# Patient Record
Sex: Female | Born: 1935 | Race: White | Hispanic: No | Smoking: Never smoker
Health system: Southern US, Community
[De-identification: ages and names within clinical notes are randomized; demographics above are authoritative.]

## PROBLEM LIST (undated history)

## (undated) DIAGNOSIS — M1990 Unspecified osteoarthritis, unspecified site: Secondary | ICD-10-CM

## (undated) DIAGNOSIS — D510 Vitamin B12 deficiency anemia due to intrinsic factor deficiency: Secondary | ICD-10-CM

## (undated) DIAGNOSIS — L853 Xerosis cutis: Secondary | ICD-10-CM

## (undated) DIAGNOSIS — K921 Melena: Secondary | ICD-10-CM

## (undated) DIAGNOSIS — C801 Malignant (primary) neoplasm, unspecified: Secondary | ICD-10-CM

## (undated) DIAGNOSIS — K219 Gastro-esophageal reflux disease without esophagitis: Secondary | ICD-10-CM

## (undated) DIAGNOSIS — K449 Diaphragmatic hernia without obstruction or gangrene: Secondary | ICD-10-CM

## (undated) DIAGNOSIS — R32 Unspecified urinary incontinence: Secondary | ICD-10-CM

## (undated) DIAGNOSIS — M4800 Spinal stenosis, site unspecified: Secondary | ICD-10-CM

## (undated) DIAGNOSIS — B019 Varicella without complication: Secondary | ICD-10-CM

## (undated) DIAGNOSIS — N159 Renal tubulo-interstitial disease, unspecified: Secondary | ICD-10-CM

## (undated) DIAGNOSIS — M810 Age-related osteoporosis without current pathological fracture: Secondary | ICD-10-CM

## (undated) DIAGNOSIS — C569 Malignant neoplasm of unspecified ovary: Secondary | ICD-10-CM

## (undated) DIAGNOSIS — I10 Essential (primary) hypertension: Secondary | ICD-10-CM

## (undated) DIAGNOSIS — IMO0002 Reserved for concepts with insufficient information to code with codable children: Secondary | ICD-10-CM

## (undated) HISTORY — DX: Melena: K92.1

## (undated) HISTORY — DX: Xerosis cutis: L85.3

## (undated) HISTORY — DX: Age-related osteoporosis without current pathological fracture: M81.0

## (undated) HISTORY — DX: Renal tubulo-interstitial disease, unspecified: N15.9

## (undated) HISTORY — DX: Reserved for concepts with insufficient information to code with codable children: IMO0002

## (undated) HISTORY — DX: Spinal stenosis, site unspecified: M48.00

## (undated) HISTORY — DX: Varicella without complication: B01.9

## (undated) HISTORY — DX: Malignant neoplasm of unspecified ovary: C56.9

## (undated) HISTORY — DX: Malignant (primary) neoplasm, unspecified: C80.1

## (undated) HISTORY — DX: Essential (primary) hypertension: I10

## (undated) HISTORY — PX: ABDOMINAL HYSTERECTOMY: SHX81

## (undated) HISTORY — DX: Diaphragmatic hernia without obstruction or gangrene: K44.9

## (undated) HISTORY — DX: Unspecified urinary incontinence: R32

## (undated) HISTORY — DX: Gastro-esophageal reflux disease without esophagitis: K21.9

## (undated) HISTORY — DX: Unspecified osteoarthritis, unspecified site: M19.90

## (undated) HISTORY — DX: Vitamin B12 deficiency anemia due to intrinsic factor deficiency: D51.0

---

## 1989-09-10 DIAGNOSIS — C569 Malignant neoplasm of unspecified ovary: Secondary | ICD-10-CM

## 1989-09-10 HISTORY — DX: Malignant neoplasm of unspecified ovary: C56.9

## 1998-09-10 HISTORY — PX: FOOT SURGERY: SHX648

## 2005-01-31 ENCOUNTER — Ambulatory Visit: Payer: Self-pay | Admitting: Internal Medicine

## 2005-02-14 ENCOUNTER — Ambulatory Visit: Payer: Self-pay | Admitting: Internal Medicine

## 2005-03-01 ENCOUNTER — Ambulatory Visit: Payer: Self-pay | Admitting: Unknown Physician Specialty

## 2005-03-10 ENCOUNTER — Ambulatory Visit: Payer: Self-pay | Admitting: Internal Medicine

## 2005-04-20 ENCOUNTER — Ambulatory Visit: Payer: Self-pay | Admitting: Unknown Physician Specialty

## 2005-04-25 ENCOUNTER — Ambulatory Visit: Payer: Self-pay | Admitting: Unknown Physician Specialty

## 2005-07-19 ENCOUNTER — Ambulatory Visit: Payer: Self-pay | Admitting: Internal Medicine

## 2005-10-23 ENCOUNTER — Ambulatory Visit: Payer: Self-pay | Admitting: Internal Medicine

## 2005-11-08 ENCOUNTER — Ambulatory Visit: Payer: Self-pay | Admitting: Internal Medicine

## 2005-12-09 ENCOUNTER — Ambulatory Visit: Payer: Self-pay | Admitting: Internal Medicine

## 2006-01-08 ENCOUNTER — Ambulatory Visit: Payer: Self-pay | Admitting: Internal Medicine

## 2006-07-18 ENCOUNTER — Ambulatory Visit: Payer: Self-pay | Admitting: Ophthalmology

## 2006-07-24 ENCOUNTER — Ambulatory Visit: Payer: Self-pay | Admitting: Ophthalmology

## 2006-09-04 ENCOUNTER — Ambulatory Visit: Payer: Self-pay | Admitting: Internal Medicine

## 2006-10-24 ENCOUNTER — Ambulatory Visit: Payer: Self-pay | Admitting: Nurse Practitioner

## 2006-10-31 ENCOUNTER — Ambulatory Visit: Payer: Self-pay | Admitting: Nurse Practitioner

## 2007-02-09 ENCOUNTER — Ambulatory Visit: Payer: Self-pay | Admitting: Internal Medicine

## 2007-09-17 ENCOUNTER — Ambulatory Visit: Payer: Self-pay | Admitting: Unknown Physician Specialty

## 2008-05-18 ENCOUNTER — Ambulatory Visit: Payer: Self-pay | Admitting: Unknown Physician Specialty

## 2008-06-28 ENCOUNTER — Ambulatory Visit: Payer: Self-pay | Admitting: Family Medicine

## 2008-07-12 ENCOUNTER — Ambulatory Visit: Payer: Self-pay | Admitting: Podiatry

## 2008-12-27 ENCOUNTER — Ambulatory Visit: Payer: Self-pay | Admitting: Unknown Physician Specialty

## 2009-03-03 ENCOUNTER — Ambulatory Visit: Payer: Self-pay | Admitting: Internal Medicine

## 2009-03-30 ENCOUNTER — Ambulatory Visit: Payer: Self-pay | Admitting: Internal Medicine

## 2009-08-09 ENCOUNTER — Ambulatory Visit: Payer: Self-pay | Admitting: Unknown Physician Specialty

## 2009-10-15 ENCOUNTER — Ambulatory Visit: Payer: Self-pay | Admitting: Unknown Physician Specialty

## 2009-12-29 ENCOUNTER — Ambulatory Visit: Payer: Self-pay | Admitting: Unknown Physician Specialty

## 2010-09-21 ENCOUNTER — Ambulatory Visit: Payer: Self-pay | Admitting: General Practice

## 2010-10-02 ENCOUNTER — Ambulatory Visit: Payer: Self-pay | Admitting: General Practice

## 2011-01-07 ENCOUNTER — Ambulatory Visit: Payer: Self-pay | Admitting: Family Medicine

## 2011-01-22 ENCOUNTER — Inpatient Hospital Stay: Payer: Self-pay | Admitting: Internal Medicine

## 2011-07-11 ENCOUNTER — Inpatient Hospital Stay: Payer: Self-pay | Admitting: Internal Medicine

## 2011-07-23 ENCOUNTER — Ambulatory Visit: Payer: Self-pay | Admitting: Internal Medicine

## 2011-07-31 ENCOUNTER — Ambulatory Visit: Payer: Self-pay | Admitting: Internal Medicine

## 2011-08-13 ENCOUNTER — Emergency Department: Payer: Self-pay | Admitting: Emergency Medicine

## 2011-09-11 HISTORY — PX: SHOULDER SURGERY: SHX246

## 2011-09-18 DIAGNOSIS — S42209A Unspecified fracture of upper end of unspecified humerus, initial encounter for closed fracture: Secondary | ICD-10-CM | POA: Diagnosis not present

## 2011-09-18 DIAGNOSIS — S8000XA Contusion of unspecified knee, initial encounter: Secondary | ICD-10-CM | POA: Diagnosis not present

## 2011-09-25 DIAGNOSIS — K2940 Chronic atrophic gastritis without bleeding: Secondary | ICD-10-CM | POA: Diagnosis not present

## 2011-09-25 DIAGNOSIS — D500 Iron deficiency anemia secondary to blood loss (chronic): Secondary | ICD-10-CM | POA: Diagnosis not present

## 2011-10-02 DIAGNOSIS — M25619 Stiffness of unspecified shoulder, not elsewhere classified: Secondary | ICD-10-CM | POA: Diagnosis not present

## 2011-10-02 DIAGNOSIS — M6281 Muscle weakness (generalized): Secondary | ICD-10-CM | POA: Diagnosis not present

## 2011-10-02 DIAGNOSIS — M25519 Pain in unspecified shoulder: Secondary | ICD-10-CM | POA: Diagnosis not present

## 2011-10-02 DIAGNOSIS — S42209A Unspecified fracture of upper end of unspecified humerus, initial encounter for closed fracture: Secondary | ICD-10-CM | POA: Diagnosis not present

## 2011-10-04 DIAGNOSIS — M25619 Stiffness of unspecified shoulder, not elsewhere classified: Secondary | ICD-10-CM | POA: Diagnosis not present

## 2011-10-04 DIAGNOSIS — M6281 Muscle weakness (generalized): Secondary | ICD-10-CM | POA: Diagnosis not present

## 2011-10-04 DIAGNOSIS — M25519 Pain in unspecified shoulder: Secondary | ICD-10-CM | POA: Diagnosis not present

## 2011-10-04 DIAGNOSIS — S42209A Unspecified fracture of upper end of unspecified humerus, initial encounter for closed fracture: Secondary | ICD-10-CM | POA: Diagnosis not present

## 2011-10-09 DIAGNOSIS — M25519 Pain in unspecified shoulder: Secondary | ICD-10-CM | POA: Diagnosis not present

## 2011-10-09 DIAGNOSIS — M6281 Muscle weakness (generalized): Secondary | ICD-10-CM | POA: Diagnosis not present

## 2011-10-09 DIAGNOSIS — M25619 Stiffness of unspecified shoulder, not elsewhere classified: Secondary | ICD-10-CM | POA: Diagnosis not present

## 2011-10-09 DIAGNOSIS — S42209A Unspecified fracture of upper end of unspecified humerus, initial encounter for closed fracture: Secondary | ICD-10-CM | POA: Diagnosis not present

## 2011-10-11 DIAGNOSIS — M25519 Pain in unspecified shoulder: Secondary | ICD-10-CM | POA: Diagnosis not present

## 2011-10-11 DIAGNOSIS — M6281 Muscle weakness (generalized): Secondary | ICD-10-CM | POA: Diagnosis not present

## 2011-10-11 DIAGNOSIS — S42209A Unspecified fracture of upper end of unspecified humerus, initial encounter for closed fracture: Secondary | ICD-10-CM | POA: Diagnosis not present

## 2011-10-11 DIAGNOSIS — M25619 Stiffness of unspecified shoulder, not elsewhere classified: Secondary | ICD-10-CM | POA: Diagnosis not present

## 2011-10-15 DIAGNOSIS — S42209A Unspecified fracture of upper end of unspecified humerus, initial encounter for closed fracture: Secondary | ICD-10-CM | POA: Diagnosis not present

## 2011-10-15 DIAGNOSIS — M25519 Pain in unspecified shoulder: Secondary | ICD-10-CM | POA: Diagnosis not present

## 2011-10-15 DIAGNOSIS — M6281 Muscle weakness (generalized): Secondary | ICD-10-CM | POA: Diagnosis not present

## 2011-10-15 DIAGNOSIS — M25619 Stiffness of unspecified shoulder, not elsewhere classified: Secondary | ICD-10-CM | POA: Diagnosis not present

## 2011-10-17 DIAGNOSIS — S42209A Unspecified fracture of upper end of unspecified humerus, initial encounter for closed fracture: Secondary | ICD-10-CM | POA: Diagnosis not present

## 2011-10-17 DIAGNOSIS — M25619 Stiffness of unspecified shoulder, not elsewhere classified: Secondary | ICD-10-CM | POA: Diagnosis not present

## 2011-10-17 DIAGNOSIS — M6281 Muscle weakness (generalized): Secondary | ICD-10-CM | POA: Diagnosis not present

## 2011-10-17 DIAGNOSIS — M25519 Pain in unspecified shoulder: Secondary | ICD-10-CM | POA: Diagnosis not present

## 2011-10-22 DIAGNOSIS — G4700 Insomnia, unspecified: Secondary | ICD-10-CM | POA: Diagnosis not present

## 2011-10-22 DIAGNOSIS — S42209A Unspecified fracture of upper end of unspecified humerus, initial encounter for closed fracture: Secondary | ICD-10-CM | POA: Diagnosis not present

## 2011-10-22 DIAGNOSIS — M25619 Stiffness of unspecified shoulder, not elsewhere classified: Secondary | ICD-10-CM | POA: Diagnosis not present

## 2011-10-22 DIAGNOSIS — M6281 Muscle weakness (generalized): Secondary | ICD-10-CM | POA: Diagnosis not present

## 2011-10-22 DIAGNOSIS — M25519 Pain in unspecified shoulder: Secondary | ICD-10-CM | POA: Diagnosis not present

## 2011-10-22 DIAGNOSIS — R350 Frequency of micturition: Secondary | ICD-10-CM | POA: Diagnosis not present

## 2011-10-23 DIAGNOSIS — D510 Vitamin B12 deficiency anemia due to intrinsic factor deficiency: Secondary | ICD-10-CM | POA: Diagnosis not present

## 2011-10-24 DIAGNOSIS — M25619 Stiffness of unspecified shoulder, not elsewhere classified: Secondary | ICD-10-CM | POA: Diagnosis not present

## 2011-10-24 DIAGNOSIS — M25519 Pain in unspecified shoulder: Secondary | ICD-10-CM | POA: Diagnosis not present

## 2011-10-24 DIAGNOSIS — M6281 Muscle weakness (generalized): Secondary | ICD-10-CM | POA: Diagnosis not present

## 2011-10-24 DIAGNOSIS — S42209A Unspecified fracture of upper end of unspecified humerus, initial encounter for closed fracture: Secondary | ICD-10-CM | POA: Diagnosis not present

## 2011-10-29 DIAGNOSIS — S42209A Unspecified fracture of upper end of unspecified humerus, initial encounter for closed fracture: Secondary | ICD-10-CM | POA: Diagnosis not present

## 2011-10-29 DIAGNOSIS — M6281 Muscle weakness (generalized): Secondary | ICD-10-CM | POA: Diagnosis not present

## 2011-10-29 DIAGNOSIS — M25519 Pain in unspecified shoulder: Secondary | ICD-10-CM | POA: Diagnosis not present

## 2011-10-29 DIAGNOSIS — M25619 Stiffness of unspecified shoulder, not elsewhere classified: Secondary | ICD-10-CM | POA: Diagnosis not present

## 2011-10-31 DIAGNOSIS — S42209A Unspecified fracture of upper end of unspecified humerus, initial encounter for closed fracture: Secondary | ICD-10-CM | POA: Diagnosis not present

## 2011-10-31 DIAGNOSIS — M25519 Pain in unspecified shoulder: Secondary | ICD-10-CM | POA: Diagnosis not present

## 2011-10-31 DIAGNOSIS — M25619 Stiffness of unspecified shoulder, not elsewhere classified: Secondary | ICD-10-CM | POA: Diagnosis not present

## 2011-10-31 DIAGNOSIS — M6281 Muscle weakness (generalized): Secondary | ICD-10-CM | POA: Diagnosis not present

## 2011-11-08 DIAGNOSIS — M25519 Pain in unspecified shoulder: Secondary | ICD-10-CM | POA: Diagnosis not present

## 2011-11-08 DIAGNOSIS — M25619 Stiffness of unspecified shoulder, not elsewhere classified: Secondary | ICD-10-CM | POA: Diagnosis not present

## 2011-11-08 DIAGNOSIS — M6281 Muscle weakness (generalized): Secondary | ICD-10-CM | POA: Diagnosis not present

## 2011-11-08 DIAGNOSIS — S42209A Unspecified fracture of upper end of unspecified humerus, initial encounter for closed fracture: Secondary | ICD-10-CM | POA: Diagnosis not present

## 2011-11-14 DIAGNOSIS — M25619 Stiffness of unspecified shoulder, not elsewhere classified: Secondary | ICD-10-CM | POA: Diagnosis not present

## 2011-11-14 DIAGNOSIS — M6281 Muscle weakness (generalized): Secondary | ICD-10-CM | POA: Diagnosis not present

## 2011-11-14 DIAGNOSIS — M25519 Pain in unspecified shoulder: Secondary | ICD-10-CM | POA: Diagnosis not present

## 2011-11-14 DIAGNOSIS — S42209A Unspecified fracture of upper end of unspecified humerus, initial encounter for closed fracture: Secondary | ICD-10-CM | POA: Diagnosis not present

## 2011-11-21 DIAGNOSIS — M6281 Muscle weakness (generalized): Secondary | ICD-10-CM | POA: Diagnosis not present

## 2011-11-21 DIAGNOSIS — M25619 Stiffness of unspecified shoulder, not elsewhere classified: Secondary | ICD-10-CM | POA: Diagnosis not present

## 2011-11-21 DIAGNOSIS — S42209A Unspecified fracture of upper end of unspecified humerus, initial encounter for closed fracture: Secondary | ICD-10-CM | POA: Diagnosis not present

## 2011-12-04 DIAGNOSIS — D510 Vitamin B12 deficiency anemia due to intrinsic factor deficiency: Secondary | ICD-10-CM | POA: Diagnosis not present

## 2011-12-04 DIAGNOSIS — D649 Anemia, unspecified: Secondary | ICD-10-CM | POA: Diagnosis not present

## 2011-12-12 DIAGNOSIS — L819 Disorder of pigmentation, unspecified: Secondary | ICD-10-CM | POA: Diagnosis not present

## 2011-12-12 DIAGNOSIS — L570 Actinic keratosis: Secondary | ICD-10-CM | POA: Diagnosis not present

## 2011-12-12 DIAGNOSIS — Q809 Congenital ichthyosis, unspecified: Secondary | ICD-10-CM | POA: Diagnosis not present

## 2011-12-12 DIAGNOSIS — L821 Other seborrheic keratosis: Secondary | ICD-10-CM | POA: Diagnosis not present

## 2011-12-13 DIAGNOSIS — Z124 Encounter for screening for malignant neoplasm of cervix: Secondary | ICD-10-CM | POA: Diagnosis not present

## 2011-12-13 DIAGNOSIS — Z23 Encounter for immunization: Secondary | ICD-10-CM | POA: Diagnosis not present

## 2011-12-13 DIAGNOSIS — E7800 Pure hypercholesterolemia, unspecified: Secondary | ICD-10-CM | POA: Diagnosis not present

## 2011-12-13 DIAGNOSIS — E119 Type 2 diabetes mellitus without complications: Secondary | ICD-10-CM | POA: Diagnosis not present

## 2011-12-13 DIAGNOSIS — R350 Frequency of micturition: Secondary | ICD-10-CM | POA: Diagnosis not present

## 2011-12-13 DIAGNOSIS — R5381 Other malaise: Secondary | ICD-10-CM | POA: Diagnosis not present

## 2011-12-19 DIAGNOSIS — M79609 Pain in unspecified limb: Secondary | ICD-10-CM | POA: Diagnosis not present

## 2011-12-19 DIAGNOSIS — B351 Tinea unguium: Secondary | ICD-10-CM | POA: Diagnosis not present

## 2012-01-07 DIAGNOSIS — R351 Nocturia: Secondary | ICD-10-CM | POA: Diagnosis not present

## 2012-01-07 DIAGNOSIS — N3941 Urge incontinence: Secondary | ICD-10-CM | POA: Diagnosis not present

## 2012-01-07 DIAGNOSIS — R350 Frequency of micturition: Secondary | ICD-10-CM | POA: Diagnosis not present

## 2012-01-15 ENCOUNTER — Ambulatory Visit: Payer: Self-pay | Admitting: Unknown Physician Specialty

## 2012-01-15 DIAGNOSIS — Z1231 Encounter for screening mammogram for malignant neoplasm of breast: Secondary | ICD-10-CM | POA: Diagnosis not present

## 2012-01-17 DIAGNOSIS — D510 Vitamin B12 deficiency anemia due to intrinsic factor deficiency: Secondary | ICD-10-CM | POA: Diagnosis not present

## 2012-01-17 DIAGNOSIS — E119 Type 2 diabetes mellitus without complications: Secondary | ICD-10-CM | POA: Diagnosis not present

## 2012-01-22 DIAGNOSIS — K2940 Chronic atrophic gastritis without bleeding: Secondary | ICD-10-CM | POA: Diagnosis not present

## 2012-01-22 DIAGNOSIS — D500 Iron deficiency anemia secondary to blood loss (chronic): Secondary | ICD-10-CM | POA: Diagnosis not present

## 2012-01-31 DIAGNOSIS — N3941 Urge incontinence: Secondary | ICD-10-CM | POA: Diagnosis not present

## 2012-02-26 DIAGNOSIS — D510 Vitamin B12 deficiency anemia due to intrinsic factor deficiency: Secondary | ICD-10-CM | POA: Diagnosis not present

## 2012-03-26 DIAGNOSIS — H02839 Dermatochalasis of unspecified eye, unspecified eyelid: Secondary | ICD-10-CM | POA: Diagnosis not present

## 2012-04-07 DIAGNOSIS — G609 Hereditary and idiopathic neuropathy, unspecified: Secondary | ICD-10-CM | POA: Diagnosis not present

## 2012-04-07 DIAGNOSIS — D510 Vitamin B12 deficiency anemia due to intrinsic factor deficiency: Secondary | ICD-10-CM | POA: Diagnosis not present

## 2012-04-08 DIAGNOSIS — D510 Vitamin B12 deficiency anemia due to intrinsic factor deficiency: Secondary | ICD-10-CM | POA: Diagnosis not present

## 2012-04-08 DIAGNOSIS — G609 Hereditary and idiopathic neuropathy, unspecified: Secondary | ICD-10-CM | POA: Diagnosis not present

## 2012-04-21 DIAGNOSIS — G450 Vertebro-basilar artery syndrome: Secondary | ICD-10-CM | POA: Diagnosis not present

## 2012-04-21 DIAGNOSIS — G589 Mononeuropathy, unspecified: Secondary | ICD-10-CM | POA: Diagnosis not present

## 2012-04-21 DIAGNOSIS — I951 Orthostatic hypotension: Secondary | ICD-10-CM | POA: Diagnosis not present

## 2012-04-28 DIAGNOSIS — D649 Anemia, unspecified: Secondary | ICD-10-CM | POA: Diagnosis not present

## 2012-04-28 DIAGNOSIS — D510 Vitamin B12 deficiency anemia due to intrinsic factor deficiency: Secondary | ICD-10-CM | POA: Diagnosis not present

## 2012-05-19 DIAGNOSIS — D510 Vitamin B12 deficiency anemia due to intrinsic factor deficiency: Secondary | ICD-10-CM | POA: Diagnosis not present

## 2012-05-22 ENCOUNTER — Ambulatory Visit: Payer: Self-pay

## 2012-05-22 DIAGNOSIS — R42 Dizziness and giddiness: Secondary | ICD-10-CM | POA: Diagnosis not present

## 2012-05-22 DIAGNOSIS — Z79899 Other long term (current) drug therapy: Secondary | ICD-10-CM | POA: Diagnosis not present

## 2012-05-22 DIAGNOSIS — R209 Unspecified disturbances of skin sensation: Secondary | ICD-10-CM | POA: Diagnosis not present

## 2012-05-22 DIAGNOSIS — E1149 Type 2 diabetes mellitus with other diabetic neurological complication: Secondary | ICD-10-CM | POA: Diagnosis not present

## 2012-05-22 DIAGNOSIS — E1142 Type 2 diabetes mellitus with diabetic polyneuropathy: Secondary | ICD-10-CM | POA: Diagnosis not present

## 2012-05-30 ENCOUNTER — Encounter: Payer: Self-pay | Admitting: Internal Medicine

## 2012-05-30 ENCOUNTER — Ambulatory Visit (INDEPENDENT_AMBULATORY_CARE_PROVIDER_SITE_OTHER): Payer: Medicare Other | Admitting: Internal Medicine

## 2012-05-30 VITALS — BP 118/72 | HR 62 | Temp 99.1°F | Ht 62.5 in | Wt 207.2 lb

## 2012-05-30 DIAGNOSIS — E119 Type 2 diabetes mellitus without complications: Secondary | ICD-10-CM | POA: Diagnosis not present

## 2012-05-30 DIAGNOSIS — G589 Mononeuropathy, unspecified: Secondary | ICD-10-CM | POA: Diagnosis not present

## 2012-05-30 DIAGNOSIS — E039 Hypothyroidism, unspecified: Secondary | ICD-10-CM | POA: Diagnosis not present

## 2012-05-30 DIAGNOSIS — E118 Type 2 diabetes mellitus with unspecified complications: Secondary | ICD-10-CM | POA: Diagnosis not present

## 2012-05-30 DIAGNOSIS — M545 Low back pain, unspecified: Secondary | ICD-10-CM

## 2012-05-30 DIAGNOSIS — F3289 Other specified depressive episodes: Secondary | ICD-10-CM

## 2012-05-30 DIAGNOSIS — R29898 Other symptoms and signs involving the musculoskeletal system: Secondary | ICD-10-CM

## 2012-05-30 DIAGNOSIS — R42 Dizziness and giddiness: Secondary | ICD-10-CM

## 2012-05-30 DIAGNOSIS — G4700 Insomnia, unspecified: Secondary | ICD-10-CM

## 2012-05-30 DIAGNOSIS — E1165 Type 2 diabetes mellitus with hyperglycemia: Secondary | ICD-10-CM | POA: Insufficient documentation

## 2012-05-30 DIAGNOSIS — D510 Vitamin B12 deficiency anemia due to intrinsic factor deficiency: Secondary | ICD-10-CM | POA: Diagnosis not present

## 2012-05-30 DIAGNOSIS — G629 Polyneuropathy, unspecified: Secondary | ICD-10-CM

## 2012-05-30 DIAGNOSIS — R209 Unspecified disturbances of skin sensation: Secondary | ICD-10-CM | POA: Diagnosis not present

## 2012-05-30 DIAGNOSIS — R202 Paresthesia of skin: Secondary | ICD-10-CM

## 2012-05-30 DIAGNOSIS — F32A Depression, unspecified: Secondary | ICD-10-CM | POA: Insufficient documentation

## 2012-05-30 DIAGNOSIS — Z8543 Personal history of malignant neoplasm of ovary: Secondary | ICD-10-CM

## 2012-05-30 DIAGNOSIS — F329 Major depressive disorder, single episode, unspecified: Secondary | ICD-10-CM | POA: Insufficient documentation

## 2012-05-30 LAB — COMPREHENSIVE METABOLIC PANEL
ALT: 15 U/L (ref 0–35)
Albumin: 4.2 g/dL (ref 3.5–5.2)
CO2: 28 mEq/L (ref 19–32)
Chloride: 102 mEq/L (ref 96–112)
GFR: 65.88 mL/min (ref >60.00)
Glucose, Bld: 94 mg/dL (ref 70–99)
Potassium: 3.9 mEq/L (ref 3.5–5.1)
Sodium: 138 mEq/L (ref 135–145)
Total Bilirubin: 0.8 mg/dL (ref 0.3–1.2)
Total Protein: 7.5 g/dL (ref 6.0–8.3)

## 2012-05-30 LAB — CBC WITH DIFFERENTIAL/PLATELET
Basophils Absolute: 0 10*3/uL (ref 0.0–0.1)
Eosinophils Absolute: 0.1 10*3/uL (ref 0.0–0.7)
Hemoglobin: 13.1 g/dL (ref 12.0–15.0)
Lymphocytes Relative: 22.2 % (ref 12.0–46.0)
Lymphs Abs: 1.5 10*3/uL (ref 0.7–4.0)
MCHC: 33.1 g/dL (ref 30.0–36.0)
Neutro Abs: 4.8 10*3/uL (ref 1.4–7.7)
Platelets: 285 10*3/uL (ref 150.0–400.0)
RDW: 12.8 % (ref 11.5–14.6)

## 2012-05-30 LAB — HEMOGLOBIN A1C: Hgb A1c MFr Bld: 7.4 % — ABNORMAL HIGH (ref 4.6–6.5)

## 2012-05-30 LAB — VITAMIN B12: Vitamin B-12: 738 pg/mL (ref 211–911)

## 2012-05-30 MED ORDER — MIRTAZAPINE 15 MG PO TABS: 15.0000 mg | ORAL_TABLET | Freq: Every day | ORAL | Status: DC

## 2012-05-30 MED ORDER — GABAPENTIN 100 MG PO CAPS: 100.0000 mg | ORAL_CAPSULE | Freq: Every day | ORAL | Status: DC

## 2012-05-30 NOTE — Assessment & Plan Note (Signed)
Patient reports episode of dizziness described as lightheadedness. Suspect that medications including Neurontin may be contributing to this. However, will also check labs including CBC, CMP, B12, TSH. Will check carotid Dopplers. Followup one month.

## 2012-05-30 NOTE — Assessment & Plan Note (Signed)
Question if this may be secondary to spinal stenosis, likely worsened by ongoing diabetic neuropathy. Will obtain report on previous MRI lumbar spine.Discussed potential referral to neurology for EMG testing, but will hold off for now until previous records reviewed.

## 2012-05-30 NOTE — Assessment & Plan Note (Signed)
Likely secondary to spinal stenosis. Will obtain records on previous MRI lumbar spine. Follow up 1 month.

## 2012-05-30 NOTE — Progress Notes (Signed)
Subjective:    Patient ID: Brenda [name], female    DOB: [birth year], 78 y.o.   MRN: [id number]  HPI 78 year old female with history of hypertension, diabetes presents to establish care. She has several concerns today. First, she reports several month history of numbness in her legs. She reports that her previous primary care physician was treating her with Neurontin 300 mg at bedtime. She denies any improvement with this medication. She notes that she has been sedated on this medication and often feels lightheaded. She denies any vertigo-like symptoms. She denies any syncope. She has had weakness for several months in her bilateral lower extremities. This has not previously been evaluated.  She is also concerned about difficulty sleeping and increased depressed mood. She reports that since her son passed away she has had increased depressed mood and difficulty sleeping. She notes that several of her close family members have died of malignancy. She has never taken medications for depression.  In regards to her diabetes, she reports blood sugars have been variable, with some highs above 200. She reports full compliance with glipizide and metformin. She denies any side effects from these medications.  She also notes a history of urinary incontinence. She reports that her urologist recently started her on Enablex. However, she cannot afford this medication. She reports minimal improvement in her symptoms on this medicine.  She also notes a history of chronic low back pain. She was told in the past that she had spinal stenosis. She reports that chronic low back pain limits her ability to function. She also notes chronic weakness in her bilateral lower extremities. She denies loss of continence of bowel. She does have chronic urinary incontinence as above.  Outpatient Encounter Prescriptions as of 05/30/2012  Medication Sig Dispense Refill  . darifenacin (ENABLEX) 15 MG 24 hr tablet Take 15 mg by mouth  daily.      . ferrous sulfate 324 (65 FE) MG TBEC Take 1 tablet by mouth 3 (three) times daily.      Marland Kitchen gabapentin (NEURONTIN) 100 MG capsule Take 1 capsule (100 mg total) by mouth at bedtime.  30 capsule  3  . glipiZIDE (GLUCOTROL XL) 10 MG 24 hr tablet Take 10 mg by mouth 2 (two) times daily.      Marland Kitchen lisinopril (PRINIVIL,ZESTRIL) 10 MG tablet Take 10 mg by mouth daily.      . metFORMIN (GLUCOPHAGE) 1000 MG tablet Take 1,000 mg by mouth 2 (two) times daily with a meal. Take one      . mirtazapine (REMERON) 15 MG tablet Take 1 tablet (15 mg total) by mouth at bedtime.  30 tablet  3  . pantoprazole (PROTONIX) 40 MG tablet Take 40 mg by mouth 2 (two) times daily.      . sucralfate (CARAFATE) 1 G tablet Take 1 g by mouth 2 (two) times daily.       BP 118/72  Pulse 62  Temp 99.1 F (37.3 C) (Oral)  Ht 5' 2.5" (1.588 m)  Wt 207 lb 4 oz (94.008 kg)  BMI 37.30 kg/m2  SpO2 96%  Review of Systems  Constitutional: Positive for fatigue. Negative for fever, chills, appetite change and unexpected weight change.  HENT: Negative for ear pain, congestion, sore throat, trouble swallowing, neck pain, voice change and sinus pressure.   Eyes: Negative for visual disturbance.  Respiratory: Negative for cough, shortness of breath, wheezing and stridor.   Cardiovascular: Negative for chest pain, palpitations and leg swelling.  Gastrointestinal: Negative for  nausea, vomiting, abdominal pain, diarrhea, constipation, blood in stool, abdominal distention and anal bleeding.  Genitourinary: Negative for dysuria and flank pain.  Musculoskeletal: Positive for myalgias and back pain. Negative for arthralgias and gait problem.  Skin: Negative for color change and rash.  Neurological: Positive for dizziness, weakness, light-headedness and numbness. Negative for syncope and headaches.  Hematological: Negative for adenopathy. Does not bruise/bleed easily.  Psychiatric/Behavioral: Positive for disturbed wake/sleep cycle and  dysphoric mood. Negative for suicidal ideas. The patient is not nervous/anxious.        Objective:   Physical Exam  Constitutional: She is oriented to person, place, and time. She appears well-developed and well-nourished. No distress.  HENT:  Head: Normocephalic and atraumatic.  Right Ear: External ear normal.  Left Ear: External ear normal.  Nose: Nose normal.  Mouth/Throat: Oropharynx is clear and moist. No oropharyngeal exudate.  Eyes: Conjunctivae normal are normal. Pupils are equal, round, and reactive to light. Right eye exhibits no discharge. Left eye exhibits no discharge. No scleral icterus.  Neck: Normal range of motion. Neck supple. Normal carotid pulses, no hepatojugular reflux and no JVD present. Carotid bruit is not present. No tracheal deviation present. No thyromegaly present.  Cardiovascular: Normal rate, regular rhythm, normal heart sounds and intact distal pulses.  Exam reveals no gallop and no friction rub.   No murmur heard. Pulmonary/Chest: Effort normal and breath sounds normal. No respiratory distress. She has no wheezes. She has no rales. She exhibits no tenderness.  Abdominal: Soft. Bowel sounds are normal. She exhibits no distension and no mass. There is no tenderness. There is no guarding.  Musculoskeletal: Normal range of motion. She exhibits no edema and no tenderness.       Right hip: She exhibits decreased strength.       Left hip: She exhibits decreased strength.       4/5 strength bilateral hip flexors  Lymphadenopathy:    She has no cervical adenopathy.  Neurological: She is alert and oriented to person, place, and time. No cranial nerve deficit. She exhibits normal muscle tone. Coordination normal.  Skin: Skin is warm and dry. No rash noted. She is not diaphoretic. No erythema. No pallor.  Psychiatric: She has a normal mood and affect. Her behavior is normal. Judgment and thought content normal.          Assessment & Plan:

## 2012-05-30 NOTE — Assessment & Plan Note (Signed)
Paresthesias of bilateral lower extremities most consistent with diabetic neuropathy. However, patient having some oversedation with Neurontin. Will reduce dose of Neurontin to 100 mg at bedtime. Will send basic lab work including CBC, B12, TSH, and electrolytes. If symptoms are persistent, we'll plan to gradually titrate up dose of Neurontin and possibly set up neurology referral for EMG testing.

## 2012-05-30 NOTE — Assessment & Plan Note (Signed)
Patient notes some depression, particularly after the death of her son. Likely contributing to insomnia. Will start Remeron to help with symptoms. Followup one month.

## 2012-05-30 NOTE — Assessment & Plan Note (Signed)
Suspect insomnia is contributing to fatigue. Will start Remeron to see if any improvement with depression and insomnia. Followup one month.

## 2012-05-30 NOTE — Assessment & Plan Note (Signed)
Patient reports history of B12 deficiency. B12 level normal today. However, macrocytosis noted on labs. Will check homocystine and methylmalonic acid.

## 2012-05-30 NOTE — Assessment & Plan Note (Signed)
Patient reports some variability in blood sugars. A1c just above 7.4%. We'll continue glipizide and metformin for now. Encouraged patient to record blood sugar readings and bring to next visit.

## 2012-06-18 ENCOUNTER — Encounter: Payer: Self-pay | Admitting: Internal Medicine

## 2012-06-18 ENCOUNTER — Ambulatory Visit (INDEPENDENT_AMBULATORY_CARE_PROVIDER_SITE_OTHER): Payer: Medicare Other | Admitting: Internal Medicine

## 2012-06-18 VITALS — BP 134/72 | HR 63 | Temp 98.5°F | Ht 63.5 in | Wt 210.0 lb

## 2012-06-18 DIAGNOSIS — Z23 Encounter for immunization: Secondary | ICD-10-CM | POA: Diagnosis not present

## 2012-06-18 DIAGNOSIS — R071 Chest pain on breathing: Secondary | ICD-10-CM | POA: Diagnosis not present

## 2012-06-18 DIAGNOSIS — D510 Vitamin B12 deficiency anemia due to intrinsic factor deficiency: Secondary | ICD-10-CM

## 2012-06-18 DIAGNOSIS — Z13820 Encounter for screening for osteoporosis: Secondary | ICD-10-CM | POA: Diagnosis not present

## 2012-06-18 DIAGNOSIS — E538 Deficiency of other specified B group vitamins: Secondary | ICD-10-CM

## 2012-06-18 DIAGNOSIS — M545 Low back pain: Secondary | ICD-10-CM

## 2012-06-18 DIAGNOSIS — E118 Type 2 diabetes mellitus with unspecified complications: Secondary | ICD-10-CM

## 2012-06-18 DIAGNOSIS — F329 Major depressive disorder, single episode, unspecified: Secondary | ICD-10-CM

## 2012-06-18 DIAGNOSIS — R0789 Other chest pain: Secondary | ICD-10-CM | POA: Insufficient documentation

## 2012-06-18 DIAGNOSIS — R202 Paresthesia of skin: Secondary | ICD-10-CM

## 2012-06-18 DIAGNOSIS — R209 Unspecified disturbances of skin sensation: Secondary | ICD-10-CM

## 2012-06-18 MED ORDER — CYANOCOBALAMIN 1000 MCG/ML IJ SOLN
1000.0000 ug | Freq: Once | INTRAMUSCULAR | Status: AC
  Administered 2012-06-18: 1000 ug via INTRAMUSCULAR

## 2012-06-18 NOTE — Progress Notes (Signed)
Subjective:    Patient ID: Brenda [name], female    DOB: 1930/11/04, 78 y.o.   MRN: [id number]  HPI 78 year old female with history diabetes, depression, spinal stenosis, pernicious anemia, and neuropathy presents for followup. In the interim since her last visit, she reports that symptoms of depression and insomnia have improved significantly with use of Remeron. She is now sleeping through the night without difficulty. Her mood is improved. In regards to neuropathy, we tried decreasing her dose of Neurontin to help with oversedation. She reports that symptoms of tingling and paresthesias in her legs have improved and she is no longer having sedation at a lower dose.  She is concerned because she continues to have pain in her lower back and sensation of weakness in her legs on occasion. MRI of the lumbar spine was performed in the past but results are not yet available at the time of her visit. She is not currently taking any medication for back pain and does not wish to start any new medicines.  She is also concerned today about some intermittent right-sided chest wall pain. This is described as aching pain extending from her right mid axilla down the right side of her chest and abdomen. It is not associated with shortness of breath, diaphoresis, palpitations, fever. It is not associated with any nausea or vomiting. She has no symptoms of abdominal pain. This is been ongoing for several weeks.  Outpatient Encounter Prescriptions as of 06/18/2012  Medication Sig Dispense Refill  . darifenacin (ENABLEX) 15 MG 24 hr tablet Take 15 mg by mouth daily.      . ferrous sulfate 324 (65 FE) MG TBEC Take 1 tablet by mouth 3 (three) times daily.      Marland Kitchen gabapentin (NEURONTIN) 100 MG capsule Take 1 capsule (100 mg total) by mouth at bedtime.  30 capsule  3  . glipiZIDE (GLUCOTROL XL) 10 MG 24 hr tablet Take 10 mg by mouth 2 (two) times daily.      Marland Kitchen lisinopril (PRINIVIL,ZESTRIL) 10 MG tablet Take 10 mg by mouth  daily.      . metFORMIN (GLUCOPHAGE) 1000 MG tablet Take 1,000 mg by mouth 2 (two) times daily with a meal. Take one      . mirtazapine (REMERON) 15 MG tablet Take 1 tablet (15 mg total) by mouth at bedtime.  30 tablet  3  . pantoprazole (PROTONIX) 40 MG tablet Take 40 mg by mouth 2 (two) times daily.      . sucralfate (CARAFATE) 1 G tablet Take 1 g by mouth 2 (two) times daily.       Facility-Administered Encounter Medications as of 06/18/2012  Medication Dose Route Frequency Provider Last Rate Last Dose  . cyanocobalamin ((VITAMIN B-12)) injection 1,000 mcg  1,000 mcg Intramuscular Once Shelia Media, MD   1,000 mcg at 06/18/12 1441   BP 134/72  Pulse 63  Temp 98.5 F (36.9 C) (Oral)  Ht 5' 3.5" (1.613 m)  Wt 210 lb (95.255 kg)  BMI 36.62 kg/m2  SpO2 98%  Review of Systems  Constitutional: Negative for fever, chills, appetite change, fatigue and unexpected weight change.  HENT: Negative for ear pain, congestion, sore throat, trouble swallowing, neck pain, voice change and sinus pressure.   Eyes: Negative for visual disturbance.  Respiratory: Negative for cough, shortness of breath, wheezing and stridor.   Cardiovascular: Positive for chest pain (right sided). Negative for palpitations and leg swelling.  Gastrointestinal: Negative for nausea, vomiting, abdominal pain, diarrhea, constipation,  blood in stool, abdominal distention and anal bleeding.  Genitourinary: Negative for dysuria and flank pain.  Musculoskeletal: Negative for myalgias, arthralgias and gait problem.  Skin: Negative for color change and rash.  Neurological: Negative for dizziness and headaches.  Hematological: Negative for adenopathy. Does not bruise/bleed easily.  Psychiatric/Behavioral: Negative for suicidal ideas, disturbed wake/sleep cycle and dysphoric mood. The patient is not nervous/anxious.        Objective:   Physical Exam  Constitutional: She is oriented to person, place, and time. She appears  well-developed and well-nourished. No distress.  HENT:  Head: Normocephalic and atraumatic.  Right Ear: External ear normal.  Left Ear: External ear normal.  Nose: Nose normal.  Mouth/Throat: Oropharynx is clear and moist. No oropharyngeal exudate.  Eyes: Conjunctivae normal are normal. Pupils are equal, round, and reactive to light. Right eye exhibits no discharge. Left eye exhibits no discharge. No scleral icterus.  Neck: Normal range of motion. Neck supple. No tracheal deviation present. No thyromegaly present.  Cardiovascular: Normal rate, regular rhythm, normal heart sounds and intact distal pulses.  Exam reveals no gallop and no friction rub.   No murmur heard. Pulmonary/Chest: Effort normal. No accessory muscle usage. Not tachypneic. No respiratory distress. She has no decreased breath sounds. She has no wheezes. She has rhonchi (few right lower lobe) in the right lower field. She has no rales. She exhibits no tenderness.  Musculoskeletal: Normal range of motion. She exhibits no edema and no tenderness.  Lymphadenopathy:    She has no cervical adenopathy.  Neurological: She is alert and oriented to person, place, and time. No cranial nerve deficit. She exhibits normal muscle tone. Coordination normal.  Skin: Skin is warm and dry. No rash noted. She is not diaphoretic. No erythema. No pallor.  Psychiatric: She has a normal mood and affect. Her behavior is normal. Judgment and thought content normal.          Assessment & Plan:

## 2012-06-18 NOTE — Assessment & Plan Note (Signed)
Recent A1c was fairly well-controlled at 7.4%. We'll continue current medications. Followup in one month.

## 2012-06-18 NOTE — Assessment & Plan Note (Signed)
Symptoms improved with use of Remeron. Will plan to continue. Followup one month.

## 2012-06-18 NOTE — Assessment & Plan Note (Signed)
Patient with history of pernicious anemia. Recent hemoglobin was normal. However persistent macrocytosis. Will plan to repeat CBC and B12 level prior to next visit. Continue B12 shots monthly.

## 2012-06-18 NOTE — Assessment & Plan Note (Signed)
Unclear etiology of right sided chest wall pain. Few crackles noted in RLL on lung exam. No other symptoms such as fever, cough to suggest pneumonia, but will get CXR for evaluation. Follow up 1 month and sooner as needed.

## 2012-06-18 NOTE — Assessment & Plan Note (Signed)
Symptomatically improved with decreased dose of neurontin.  Labs remarkable for macrocytosis c/w known B12 deficiency, otherwise normal. Will continue to monitor for now. If symptoms recur, then would favor neurology evaluation with EMG testing.

## 2012-06-18 NOTE — Assessment & Plan Note (Signed)
Still awaiting results on MRI of the lumbar spine. Will request again. Symptoms likely secondary to spinal stenosis. Followup one month.

## 2012-06-24 ENCOUNTER — Ambulatory Visit: Payer: Self-pay | Admitting: Internal Medicine

## 2012-06-24 DIAGNOSIS — R071 Chest pain on breathing: Secondary | ICD-10-CM | POA: Diagnosis not present

## 2012-06-24 DIAGNOSIS — M810 Age-related osteoporosis without current pathological fracture: Secondary | ICD-10-CM | POA: Diagnosis not present

## 2012-06-24 DIAGNOSIS — M899 Disorder of bone, unspecified: Secondary | ICD-10-CM | POA: Diagnosis not present

## 2012-06-24 DIAGNOSIS — Z13820 Encounter for screening for osteoporosis: Secondary | ICD-10-CM | POA: Diagnosis not present

## 2012-06-24 DIAGNOSIS — R0789 Other chest pain: Secondary | ICD-10-CM | POA: Diagnosis not present

## 2012-06-25 ENCOUNTER — Telehealth: Payer: Self-pay | Admitting: Internal Medicine

## 2012-06-25 NOTE — Telephone Encounter (Signed)
CXR was normal.

## 2012-06-25 NOTE — Telephone Encounter (Signed)
Bone density test was abnormal and showed weakening of the bones. T-score -2.4. I would recommend setting up follow up visit to discuss treatment options.

## 2012-06-26 DIAGNOSIS — N390 Urinary tract infection, site not specified: Secondary | ICD-10-CM | POA: Diagnosis not present

## 2012-06-26 DIAGNOSIS — R350 Frequency of micturition: Secondary | ICD-10-CM | POA: Diagnosis not present

## 2012-06-26 DIAGNOSIS — N3941 Urge incontinence: Secondary | ICD-10-CM | POA: Diagnosis not present

## 2012-06-26 DIAGNOSIS — R351 Nocturia: Secondary | ICD-10-CM | POA: Insufficient documentation

## 2012-06-26 NOTE — Telephone Encounter (Signed)
Left message on machine at home for patient to return call. 

## 2012-06-27 NOTE — Telephone Encounter (Signed)
Patient advised via telephone, she will call back to schedule bone density appt.

## 2012-06-27 NOTE — Telephone Encounter (Signed)
Patient advised via telephone

## 2012-07-16 ENCOUNTER — Other Ambulatory Visit: Payer: Medicare Other

## 2012-07-16 ENCOUNTER — Other Ambulatory Visit: Payer: Self-pay | Admitting: *Deleted

## 2012-07-16 DIAGNOSIS — D510 Vitamin B12 deficiency anemia due to intrinsic factor deficiency: Secondary | ICD-10-CM

## 2012-07-16 DIAGNOSIS — F329 Major depressive disorder, single episode, unspecified: Secondary | ICD-10-CM

## 2012-07-16 DIAGNOSIS — E538 Deficiency of other specified B group vitamins: Secondary | ICD-10-CM

## 2012-07-16 DIAGNOSIS — D649 Anemia, unspecified: Secondary | ICD-10-CM

## 2012-07-17 ENCOUNTER — Other Ambulatory Visit (INDEPENDENT_AMBULATORY_CARE_PROVIDER_SITE_OTHER): Payer: Medicare Other

## 2012-07-17 DIAGNOSIS — D649 Anemia, unspecified: Secondary | ICD-10-CM | POA: Diagnosis not present

## 2012-07-17 DIAGNOSIS — E538 Deficiency of other specified B group vitamins: Secondary | ICD-10-CM | POA: Diagnosis not present

## 2012-07-17 DIAGNOSIS — D510 Vitamin B12 deficiency anemia due to intrinsic factor deficiency: Secondary | ICD-10-CM

## 2012-07-17 DIAGNOSIS — F329 Major depressive disorder, single episode, unspecified: Secondary | ICD-10-CM | POA: Diagnosis not present

## 2012-07-23 ENCOUNTER — Encounter: Payer: Self-pay | Admitting: Internal Medicine

## 2012-07-23 ENCOUNTER — Ambulatory Visit (INDEPENDENT_AMBULATORY_CARE_PROVIDER_SITE_OTHER): Payer: Medicare Other | Admitting: Internal Medicine

## 2012-07-23 ENCOUNTER — Ambulatory Visit: Payer: Medicare Other | Admitting: Internal Medicine

## 2012-07-23 VITALS — BP 100/60 | HR 62 | Temp 98.2°F | Ht 63.5 in | Wt 213.0 lb

## 2012-07-23 DIAGNOSIS — M810 Age-related osteoporosis without current pathological fracture: Secondary | ICD-10-CM | POA: Diagnosis not present

## 2012-07-23 DIAGNOSIS — G4700 Insomnia, unspecified: Secondary | ICD-10-CM

## 2012-07-23 DIAGNOSIS — R071 Chest pain on breathing: Secondary | ICD-10-CM | POA: Diagnosis not present

## 2012-07-23 DIAGNOSIS — R209 Unspecified disturbances of skin sensation: Secondary | ICD-10-CM

## 2012-07-23 DIAGNOSIS — R0789 Other chest pain: Secondary | ICD-10-CM

## 2012-07-23 DIAGNOSIS — E118 Type 2 diabetes mellitus with unspecified complications: Secondary | ICD-10-CM

## 2012-07-23 DIAGNOSIS — R202 Paresthesia of skin: Secondary | ICD-10-CM

## 2012-07-23 NOTE — Assessment & Plan Note (Signed)
Insomnia improved with use of Remeron. Will continue.

## 2012-07-23 NOTE — Assessment & Plan Note (Signed)
Recent density testing showed T score of -2.4. Discussed this with patient today. She is not a good candidate for bisphosphonates given history of severe GERD. Will look into insurance coverage for Prolia.

## 2012-07-23 NOTE — Assessment & Plan Note (Signed)
Significant improvement in symptoms with use of gabapentin. We'll continue.

## 2012-07-23 NOTE — Assessment & Plan Note (Signed)
Right-sided chest wall pain has resolved. Chest x-ray was normal. Suspect that symptoms are related to muscular strain after using a lawnmower. Will continue to monitor.

## 2012-07-23 NOTE — Assessment & Plan Note (Signed)
Patient reports good control of blood sugars. Recent A1c was 7.4%. We'll plan to recheck A1c with labs in 1 month. Continue current meds.

## 2012-07-23 NOTE — Progress Notes (Signed)
Subjective:    Patient ID: Brenda [name], female    DOB: Apr 11, 1931, 78 y.o.   MRN: [id number]  HPI 78 year old female with history of diabetes, hypertension, neuropathy, and insomnia presents for followup. She reports that she is doing well. She reports that symptoms of burning in her legs has resolved with use of gabapentin. She denies side effects from this medication. In regards to insomnia, she notes some persistent episodes, typically when she is feeling anxious about upcoming events however in general insomnia has improved with use of Remeron. In regards to diabetes, she did not bring a record of blood sugars today. She does note a low blood sugar of 54 this morning. However, that does not occur on a regular basis. Most fasting sugars are near 80-100. She reports full compliance with her medications.  Outpatient Encounter Prescriptions as of 07/23/2012  Medication Sig Dispense Refill  . ferrous sulfate 324 (65 FE) MG TBEC Take 1 tablet by mouth 3 (three) times daily.      Marland Kitchen gabapentin (NEURONTIN) 100 MG capsule Take 1 capsule (100 mg total) by mouth at bedtime.  30 capsule  3  . glipiZIDE (GLUCOTROL XL) 10 MG 24 hr tablet Take 10 mg by mouth 2 (two) times daily.      Marland Kitchen lisinopril (PRINIVIL,ZESTRIL) 10 MG tablet Take 10 mg by mouth daily.      . metFORMIN (GLUCOPHAGE) 1000 MG tablet Take 1,000 mg by mouth 2 (two) times daily with a meal. Take one      . mirtazapine (REMERON) 15 MG tablet Take 1 tablet (15 mg total) by mouth at bedtime.  30 tablet  3  . pantoprazole (PROTONIX) 40 MG tablet Take 40 mg by mouth 2 (two) times daily.      . sucralfate (CARAFATE) 1 G tablet Take 1 g by mouth 2 (two) times daily.      . [DISCONTINUED] darifenacin (ENABLEX) 15 MG 24 hr tablet Take 15 mg by mouth daily.       BP 100/60  Pulse 62  Temp 98.2 F (36.8 C) (Oral)  Ht 5' 3.5" (1.613 m)  Wt 213 lb (96.616 kg)  BMI 37.14 kg/m2  SpO2 97%  Review of Systems  Constitutional: Negative for fever, chills,  appetite change, fatigue and unexpected weight change.  HENT: Negative for ear pain, congestion, sore throat, trouble swallowing, neck pain, voice change and sinus pressure.   Eyes: Negative for visual disturbance.  Respiratory: Negative for cough, shortness of breath, wheezing and stridor.   Cardiovascular: Negative for chest pain, palpitations and leg swelling.  Gastrointestinal: Negative for nausea, vomiting, abdominal pain, diarrhea, constipation, blood in stool, abdominal distention and anal bleeding.  Genitourinary: Negative for dysuria and flank pain.  Musculoskeletal: Positive for myalgias, back pain and arthralgias. Negative for gait problem.  Skin: Negative for color change and rash.  Neurological: Negative for dizziness and headaches.  Hematological: Negative for adenopathy. Does not bruise/bleed easily.  Psychiatric/Behavioral: Negative for suicidal ideas, sleep disturbance and dysphoric mood. The patient is not nervous/anxious.        Objective:   Physical Exam  Constitutional: She is oriented to person, place, and time. She appears well-developed and well-nourished. No distress.  HENT:  Head: Normocephalic and atraumatic.  Right Ear: External ear normal.  Left Ear: External ear normal.  Nose: Nose normal.  Mouth/Throat: Oropharynx is clear and moist. No oropharyngeal exudate.  Eyes: Conjunctivae normal are normal. Pupils are equal, round, and reactive to light. Right eye exhibits no  discharge. Left eye exhibits no discharge. No scleral icterus.  Neck: Normal range of motion. Neck supple. No tracheal deviation present. No thyromegaly present.  Cardiovascular: Normal rate, regular rhythm, normal heart sounds and intact distal pulses.  Exam reveals no gallop and no friction rub.   No murmur heard. Pulmonary/Chest: Effort normal and breath sounds normal. No respiratory distress. She has no wheezes. She has no rales. She exhibits no tenderness.  Musculoskeletal: Normal range of  motion. She exhibits no edema and no tenderness.  Lymphadenopathy:    She has no cervical adenopathy.  Neurological: She is alert and oriented to person, place, and time. No cranial nerve deficit. She exhibits normal muscle tone. Coordination normal.  Skin: Skin is warm and dry. No rash noted. She is not diaphoretic. No erythema. No pallor.  Psychiatric: She has a normal mood and affect. Her behavior is normal. Judgment and thought content normal.          Assessment & Plan:

## 2012-07-24 ENCOUNTER — Encounter: Payer: Self-pay | Admitting: Internal Medicine

## 2012-07-25 ENCOUNTER — Ambulatory Visit (INDEPENDENT_AMBULATORY_CARE_PROVIDER_SITE_OTHER): Payer: Medicare Other | Admitting: Internal Medicine

## 2012-07-25 DIAGNOSIS — E538 Deficiency of other specified B group vitamins: Secondary | ICD-10-CM | POA: Diagnosis not present

## 2012-07-25 MED ORDER — CYANOCOBALAMIN 1000 MCG/ML IJ SOLN
1000.0000 ug | Freq: Once | INTRAMUSCULAR | Status: AC
  Administered 2012-07-25: 1000 ug via INTRAMUSCULAR

## 2012-07-25 NOTE — Progress Notes (Signed)
  Subjective:    Patient ID: Brenda [name], female    DOB: 1931/07/10, 78 y.o.   MRN: [id number]  HPI Nurse only   Review of Systems     Objective:   Physical Exam        Assessment & Plan:

## 2012-07-29 ENCOUNTER — Ambulatory Visit: Payer: Self-pay | Admitting: Internal Medicine

## 2012-07-29 ENCOUNTER — Telehealth: Payer: Self-pay | Admitting: Internal Medicine

## 2012-07-29 DIAGNOSIS — I6529 Occlusion and stenosis of unspecified carotid artery: Secondary | ICD-10-CM | POA: Diagnosis not present

## 2012-07-29 DIAGNOSIS — I658 Occlusion and stenosis of other precerebral arteries: Secondary | ICD-10-CM | POA: Diagnosis not present

## 2012-07-29 DIAGNOSIS — R42 Dizziness and giddiness: Secondary | ICD-10-CM | POA: Diagnosis not present

## 2012-07-29 NOTE — Telephone Encounter (Signed)
Carotid dopplers were normal. 

## 2012-07-30 NOTE — Telephone Encounter (Signed)
Left message on answering machine for patient to call office.

## 2012-08-11 ENCOUNTER — Encounter: Payer: Self-pay | Admitting: Internal Medicine

## 2012-08-11 DIAGNOSIS — N3941 Urge incontinence: Secondary | ICD-10-CM | POA: Diagnosis not present

## 2012-08-11 DIAGNOSIS — N390 Urinary tract infection, site not specified: Secondary | ICD-10-CM | POA: Diagnosis not present

## 2012-08-11 DIAGNOSIS — R350 Frequency of micturition: Secondary | ICD-10-CM | POA: Diagnosis not present

## 2012-08-28 ENCOUNTER — Encounter: Payer: Self-pay | Admitting: Internal Medicine

## 2012-08-28 ENCOUNTER — Ambulatory Visit (INDEPENDENT_AMBULATORY_CARE_PROVIDER_SITE_OTHER): Payer: Medicare Other | Admitting: Internal Medicine

## 2012-08-28 VITALS — BP 120/72 | HR 74 | Temp 98.5°F | Resp 16 | Wt 213.8 lb

## 2012-08-28 DIAGNOSIS — E118 Type 2 diabetes mellitus with unspecified complications: Secondary | ICD-10-CM

## 2012-08-28 DIAGNOSIS — G8929 Other chronic pain: Secondary | ICD-10-CM | POA: Diagnosis not present

## 2012-08-28 DIAGNOSIS — M545 Other chronic pain: Secondary | ICD-10-CM | POA: Insufficient documentation

## 2012-08-28 DIAGNOSIS — D649 Anemia, unspecified: Secondary | ICD-10-CM | POA: Diagnosis not present

## 2012-08-28 DIAGNOSIS — G4700 Insomnia, unspecified: Secondary | ICD-10-CM

## 2012-08-28 LAB — COMPREHENSIVE METABOLIC PANEL
Albumin: 3.8 g/dL (ref 3.5–5.2)
BUN: 15 mg/dL (ref 6–23)
CO2: 27 mEq/L (ref 19–32)
GFR: 61.01 mL/min (ref >60.00)
Glucose, Bld: 228 mg/dL — ABNORMAL HIGH (ref 70–99)
Potassium: 5 mEq/L (ref 3.5–5.1)
Sodium: 136 mEq/L (ref 135–145)
Total Bilirubin: 0.4 mg/dL (ref 0.3–1.2)
Total Protein: 7.1 g/dL (ref 6.0–8.3)

## 2012-08-28 LAB — CBC WITH DIFFERENTIAL/PLATELET
Basophils Relative: 0.5 % (ref 0.0–3.0)
Eosinophils Relative: 3.1 % (ref 0.0–5.0)
HCT: 37.3 % (ref 36.0–46.0)
Hemoglobin: 12.8 g/dL (ref 12.0–15.0)
Lymphs Abs: 1.7 10*3/uL (ref 0.7–4.0)
MCV: 103.5 fl — ABNORMAL HIGH (ref 78.0–100.0)
Monocytes Relative: 6.4 % (ref 3.0–12.0)
Platelets: 280 10*3/uL (ref 150.0–400.0)
RBC: 3.6 Mil/uL — ABNORMAL LOW (ref 3.87–5.11)
WBC: 7.2 10*3/uL (ref 4.5–10.5)

## 2012-08-28 LAB — HEMOGLOBIN A1C: Hgb A1c MFr Bld: 7.4 % — ABNORMAL HIGH (ref 4.6–6.5)

## 2012-08-28 NOTE — Assessment & Plan Note (Signed)
Patient has chronic low back pain secondary to osteoarthritis. Symptoms have been worsening with some increasing weakness in the lower extremities. Will get plain x-ray of the lumbar spine for further evaluation. Encouraged her to consider referral to pain management. She has been seen by pain management in the past and did not find this to be helpful. She is not interested in invasive options for treatment. We'll continue Tylenol as needed for pain. Followup in 4 weeks or sooner as needed.

## 2012-08-28 NOTE — Assessment & Plan Note (Signed)
Insomnia is much improved with use of Remeron. Encouraged her to continue this medication.

## 2012-08-28 NOTE — Assessment & Plan Note (Signed)
Patient reports good control of blood sugars. Will recheck A1c with labs today. Continue glipizide and metformin.

## 2012-08-28 NOTE — Progress Notes (Signed)
Subjective:    Patient ID: Brenda [name], female    DOB: 1931/07/10, 78 y.o.   MRN: [id number]  HPI 78 year old female with history of diabetes, hypertension, chronic low back pain secondary to osteoarthritis presents for followup. Her primary concern today is worsening low back pain. She reports that she underwent MRI of the lumbar spine in the past, several years ago, which showed osteoarthritis. She underwent physical therapy with no improvement. She opted not to pursue invasive intervention such as epidural injections. Over the last few months, her symptoms of low back pain have been worsening. She has noticed increasing weakness in her legs. She denies any loss of bowel or bladder continence. She generally does not take any medication for her back pain. It does limit her ability to perform activities of daily living and she requires frequent periods of rest.  Regards to diabetes, she reports blood sugars have been well-controlled, typically near 80 in the morning and near 150 at night. She denies any low blood sugars less than 80 or elevated sugars greater than 200. She reports compliance with her medications.  Outpatient Encounter Prescriptions as of 08/28/2012  Medication Sig Dispense Refill  . ferrous sulfate 324 (65 FE) MG TBEC Take 1 tablet by mouth 3 (three) times daily.      Marland Kitchen glipiZIDE (GLUCOTROL XL) 10 MG 24 hr tablet Take 10 mg by mouth 2 (two) times daily.      Marland Kitchen lisinopril (PRINIVIL,ZESTRIL) 10 MG tablet Take 10 mg by mouth daily.      . metFORMIN (GLUCOPHAGE) 1000 MG tablet Take 1,000 mg by mouth 2 (two) times daily with a meal. Take one      . mirtazapine (REMERON) 15 MG tablet Take 1 tablet (15 mg total) by mouth at bedtime.  30 tablet  3  . pantoprazole (PROTONIX) 40 MG tablet Take 40 mg by mouth 2 (two) times daily.      . sucralfate (CARAFATE) 1 G tablet Take 1 g by mouth 2 (two) times daily.      . [DISCONTINUED] gabapentin (NEURONTIN) 100 MG capsule Take 1 capsule (100 mg  total) by mouth at bedtime.  30 capsule  3   BP 120/72  Pulse 74  Temp 98.5 F (36.9 C) (Oral)  Resp 16  Wt 213 lb 12 oz (96.956 kg)  Review of Systems  Constitutional: Positive for fatigue. Negative for fever, chills, appetite change and unexpected weight change.  HENT: Negative for ear pain, congestion, sore throat, trouble swallowing, neck pain, voice change and sinus pressure.   Eyes: Negative for visual disturbance.  Respiratory: Negative for cough, shortness of breath, wheezing and stridor.   Cardiovascular: Negative for chest pain, palpitations and leg swelling.  Gastrointestinal: Negative for nausea, vomiting, abdominal pain, diarrhea, constipation, blood in stool, abdominal distention and anal bleeding.  Genitourinary: Negative for dysuria and flank pain.  Musculoskeletal: Positive for myalgias, back pain and arthralgias. Negative for gait problem.  Skin: Negative for color change and rash.  Neurological: Positive for weakness. Negative for dizziness and headaches.  Hematological: Negative for adenopathy. Does not bruise/bleed easily.  Psychiatric/Behavioral: Negative for suicidal ideas, sleep disturbance and dysphoric mood. The patient is not nervous/anxious.        Objective:   Physical Exam  Constitutional: She is oriented to person, place, and time. She appears well-developed and well-nourished. No distress.  HENT:  Head: Normocephalic and atraumatic.  Right Ear: External ear normal.  Left Ear: External ear normal.  Nose: Nose normal.  Mouth/Throat: Oropharynx is clear and moist. No oropharyngeal exudate.  Eyes: Conjunctivae normal are normal. Pupils are equal, round, and reactive to light. Right eye exhibits no discharge. Left eye exhibits no discharge. No scleral icterus.  Neck: Normal range of motion. Neck supple. No tracheal deviation present. No thyromegaly present.  Cardiovascular: Normal rate, regular rhythm, normal heart sounds and intact distal pulses.  Exam  reveals no gallop and no friction rub.   No murmur heard. Pulmonary/Chest: Effort normal and breath sounds normal. No respiratory distress. She has no wheezes. She has no rales. She exhibits no tenderness.  Musculoskeletal: She exhibits no edema and no tenderness.       Lumbar back: She exhibits decreased range of motion, tenderness, pain and spasm.  Lymphadenopathy:    She has no cervical adenopathy.  Neurological: She is alert and oriented to person, place, and time. No cranial nerve deficit. She exhibits normal muscle tone. Coordination normal.  Skin: Skin is warm and dry. No rash noted. She is not diaphoretic. No erythema. No pallor.  Psychiatric: She has a normal mood and affect. Her behavior is normal. Judgment and thought content normal.          Assessment & Plan:

## 2012-09-08 ENCOUNTER — Ambulatory Visit: Payer: Self-pay | Admitting: Internal Medicine

## 2012-09-08 DIAGNOSIS — M5137 Other intervertebral disc degeneration, lumbosacral region: Secondary | ICD-10-CM | POA: Diagnosis not present

## 2012-09-10 DIAGNOSIS — N159 Renal tubulo-interstitial disease, unspecified: Secondary | ICD-10-CM

## 2012-09-10 HISTORY — DX: Renal tubulo-interstitial disease, unspecified: N15.9

## 2012-09-14 ENCOUNTER — Telehealth: Payer: Self-pay | Admitting: Internal Medicine

## 2012-09-14 NOTE — Telephone Encounter (Signed)
Plain xray of the lumbar spine showed degenerative changes. If pain is persistent, I would recommend setting up evaluation with back pain specialist, such as Dr. Yves Dill.

## 2012-09-15 NOTE — Telephone Encounter (Signed)
Left message on machine at home advising patient as instructed. 

## 2012-09-16 DIAGNOSIS — K2940 Chronic atrophic gastritis without bleeding: Secondary | ICD-10-CM | POA: Diagnosis not present

## 2012-09-26 ENCOUNTER — Ambulatory Visit (INDEPENDENT_AMBULATORY_CARE_PROVIDER_SITE_OTHER): Payer: Medicare Other | Admitting: *Deleted

## 2012-09-26 DIAGNOSIS — D510 Vitamin B12 deficiency anemia due to intrinsic factor deficiency: Secondary | ICD-10-CM

## 2012-09-26 MED ORDER — CYANOCOBALAMIN 1000 MCG/ML IJ SOLN
1000.0000 ug | Freq: Once | INTRAMUSCULAR | Status: AC
  Administered 2012-09-26: 1000 ug via INTRAMUSCULAR

## 2012-09-29 ENCOUNTER — Encounter: Payer: Self-pay | Admitting: Internal Medicine

## 2012-10-30 ENCOUNTER — Ambulatory Visit (INDEPENDENT_AMBULATORY_CARE_PROVIDER_SITE_OTHER): Payer: Medicare Other | Admitting: Adult Health

## 2012-10-30 ENCOUNTER — Encounter: Payer: Self-pay | Admitting: Adult Health

## 2012-10-30 VITALS — BP 137/79 | HR 83 | Temp 98.6°F | Resp 14 | Ht 64.0 in | Wt 218.0 lb

## 2012-10-30 MED ORDER — DOXYCYCLINE HYCLATE 100 MG PO TABS: 100.0000 mg | ORAL_TABLET | Freq: Two times a day (BID) | ORAL | Status: DC

## 2012-10-30 MED ORDER — NYSTATIN 100000 UNIT/GM EX CREA: TOPICAL_CREAM | Freq: Two times a day (BID) | CUTANEOUS | Status: DC

## 2012-10-30 NOTE — Assessment & Plan Note (Signed)
Rash between buttocks appears it is yeast. Start nystatin bid x 7 days.

## 2012-10-30 NOTE — Assessment & Plan Note (Signed)
Skin tear on left hand caused accidentally by her dog as he jumped up on her to greet her. Start doxycycline for prophylactic tx. Asked patient to not take the iron while on the doxy because the iron will decrease efficacy of doxy.

## 2012-10-30 NOTE — Patient Instructions (Addendum)
  Please start the Doxycycline antibiotic today. You will take 1 tablet two times a day for 10 days.  Keep the area on your hand covered with the clear dressing. You can change the dressing every 3-4 days. This dressing allows for the wound to breathe.   For the rash on your buttock, please apply the Nystatin cream two times a day for 7 days.

## 2012-10-30 NOTE — Progress Notes (Signed)
  Subjective:    Patient ID: Brenda [name], female    DOB: Jan 16, 1931, 79 y.o.   MRN: [id number]  HPI  Patient presents to clinic with a skin tear on her left hand that was caused by her dog when he jumped up to greet her. She denies fever, chills. She has been applying neosporin to the area daily.  Patient also c/o rash on buttocks in between cheeks.  Review of Systems  Skin: Positive for rash and wound.       Rash in between buttock cheeks. Wound on left hand - skin tear.    BP 137/79  Pulse 83  Temp(Src) 98.6 F (37 C) (Oral)  Resp 14  Ht 5\' 4"  (1.626 m)  Wt 218 lb (98.884 kg)  BMI 37.4 kg/m2  SpO2 94%     Objective:   Physical Exam  Constitutional: She is oriented to person, place, and time. She appears well-developed and well-nourished.  Neurological: She is alert and oriented to person, place, and time.  Skin: Rash noted.  Rash on buttocks appears like yeast. Skin tear on left hand without s/s of infection. There is slight erythema from the trauma to the area but there is no drainage.   Psychiatric: She has a normal mood and affect. Her behavior is normal.          Assessment & Plan:

## 2012-11-20 DIAGNOSIS — N318 Other neuromuscular dysfunction of bladder: Secondary | ICD-10-CM | POA: Diagnosis not present

## 2012-11-20 DIAGNOSIS — R351 Nocturia: Secondary | ICD-10-CM | POA: Diagnosis not present

## 2012-12-02 ENCOUNTER — Ambulatory Visit (INDEPENDENT_AMBULATORY_CARE_PROVIDER_SITE_OTHER): Payer: Medicare Other | Admitting: Internal Medicine

## 2012-12-02 ENCOUNTER — Encounter: Payer: Self-pay | Admitting: Internal Medicine

## 2012-12-02 VITALS — BP 120/60 | HR 58 | Temp 98.6°F | Ht 63.0 in | Wt 217.0 lb

## 2012-12-02 DIAGNOSIS — R0989 Other specified symptoms and signs involving the circulatory and respiratory systems: Secondary | ICD-10-CM

## 2012-12-02 DIAGNOSIS — E118 Type 2 diabetes mellitus with unspecified complications: Secondary | ICD-10-CM | POA: Diagnosis not present

## 2012-12-02 DIAGNOSIS — I10 Essential (primary) hypertension: Secondary | ICD-10-CM | POA: Diagnosis not present

## 2012-12-02 DIAGNOSIS — E785 Hyperlipidemia, unspecified: Secondary | ICD-10-CM

## 2012-12-02 DIAGNOSIS — R0609 Other forms of dyspnea: Secondary | ICD-10-CM

## 2012-12-02 DIAGNOSIS — D510 Vitamin B12 deficiency anemia due to intrinsic factor deficiency: Secondary | ICD-10-CM | POA: Diagnosis not present

## 2012-12-02 DIAGNOSIS — Z0000 Encounter for general adult medical examination without abnormal findings: Secondary | ICD-10-CM | POA: Diagnosis not present

## 2012-12-02 DIAGNOSIS — L309 Dermatitis, unspecified: Secondary | ICD-10-CM | POA: Insufficient documentation

## 2012-12-02 DIAGNOSIS — L259 Unspecified contact dermatitis, unspecified cause: Secondary | ICD-10-CM

## 2012-12-02 LAB — VITAMIN B12: Vitamin B-12: 451 pg/mL (ref 211–911)

## 2012-12-02 LAB — COMPREHENSIVE METABOLIC PANEL
ALT: 16 U/L (ref 0–35)
Alkaline Phosphatase: 73 U/L (ref 39–117)
Potassium: 4.1 mEq/L (ref 3.5–5.1)
Sodium: 137 mEq/L (ref 135–145)
Total Bilirubin: 0.5 mg/dL (ref 0.3–1.2)
Total Protein: 6.9 g/dL (ref 6.0–8.3)

## 2012-12-02 LAB — LIPID PANEL
Cholesterol: 173 mg/dL (ref 0–200)
LDL Cholesterol: 95 mg/dL (ref 0–99)
Total CHOL/HDL Ratio: 3

## 2012-12-02 LAB — CBC WITH DIFFERENTIAL/PLATELET
Basophils Relative: 0.8 % (ref 0.0–3.0)
Eosinophils Relative: 3 % (ref 0.0–5.0)
Hemoglobin: 12.7 g/dL (ref 12.0–15.0)
Lymphocytes Relative: 18.7 % (ref 12.0–46.0)
Monocytes Relative: 6.5 % (ref 3.0–12.0)
Neutro Abs: 3.9 10*3/uL (ref 1.4–7.7)
RBC: 3.64 Mil/uL — ABNORMAL LOW (ref 3.87–5.11)
WBC: 5.5 10*3/uL (ref 4.5–10.5)

## 2012-12-02 LAB — MICROALBUMIN / CREATININE URINE RATIO
Creatinine,U: 120.3 mg/dL
Microalb Creat Ratio: 0.7 mg/g (ref 0.0–30.0)

## 2012-12-02 MED ORDER — NYSTATIN 100000 UNIT/GM EX POWD: Freq: Four times a day (QID) | CUTANEOUS | Status: DC

## 2012-12-02 MED ORDER — BARRIER CREAM NON-SPECIFIED: 1.0000 "application " | TOPICAL_CREAM | Freq: Two times a day (BID) | TOPICAL | Status: DC | PRN

## 2012-12-02 NOTE — Assessment & Plan Note (Signed)
General medical exam including breast exam normal today. External pelvic exam normal except for her chronic external hemorrhoids. Internal pelvic exam deferred as this was recently completed by her OB/GYN. Health maintenance is up to date. Will check labs including CBC, CMP, lipid profile. Symptoms of exertional dyspnea are concerning for cardiac ischemia. EKG shows nonspecific changes. Will set up cardiology evaluation. Appropriate screening performed. Encouraged patient to use hearing aids.

## 2012-12-02 NOTE — Assessment & Plan Note (Signed)
Will check B12 with labs today. Continue monthly B12 shots.

## 2012-12-02 NOTE — Assessment & Plan Note (Signed)
Dermatitis noted between the buttocks. Secondary to candidiasis and chronic moisture/pressure. Encouraged patient to try to keep the area dry and limit pressure to this location. Will apply topical nystatin cream and powder. Will use barrier cream. If no improvement, consider referral to wound healing Center.

## 2012-12-02 NOTE — Assessment & Plan Note (Signed)
Will check A1c with labs today. We'll continue metformin. Encouraged healthy diet low in processed carbohydrates.

## 2012-12-02 NOTE — Assessment & Plan Note (Signed)
Will check lipids and LFTs with labs today. 

## 2012-12-02 NOTE — Assessment & Plan Note (Signed)
BP Readings from Last 3 Encounters:  12/02/12 120/60  10/30/12 137/79  08/28/12 120/72   Blood pressure well-controlled with lisinopril. Will continue. Will check renal function with labs today.

## 2012-12-02 NOTE — Progress Notes (Signed)
Subjective:    Patient ID: Brenda [name], female    DOB: 01-22-1931, 79 y.o.   MRN: [id number]  HPI The patient is here for annual Medicare wellness examination and management of other chronic and acute problems.   The risk factors are reflected in the social history.  The roster of all physicians providing medical care to patient - is listed in the Snapshot section of the chart.  Activities of daily living:  The patient is 100% independent in all ADLs: dressing, toileting, feeding as well as independent mobility  Home safety : The patient does not have smoke detectors in the home. They wear seatbelts.  Has a med alert to wear at home. There are no firearms at home. There is no violence in the home.   There is no risks for hepatitis, STDs or HIV. There is a history of blood transfusion when she had bleeding ulcers. They have no travel history to infectious disease endemic areas of the world.  The patient has seen their dentist in the last six month. (Dr. Gordy Councilman) They have seen their eye doctor in the last year. [facility]) She has hearing aids but does not generally wear them.  They have deferred audiologic testing in the last year.   They do not  have excessive sun exposure. Discussed the need for sun protection: hats, long sleeves and use of sunscreen if there is significant sun exposure. (Derm- Dr. Orson Aloe)  Diet: the importance of a healthy diet is discussed. They do have a healthy diet.  The benefits of regular aerobic exercise were discussed. She does not exercise.  Depression screen: there are no signs or vegative symptoms of depression- irritability, change in appetite, anhedonia, sadness/tearfullness. Occasional sadness which she attributes to fatigue.  Cognitive assessment: the patient manages all their financial and personal affairs and is actively engaged. They could relate day,date,year and events.  HCPOA - Daughter, Robby Sermon  The following portions of  the patient's history were reviewed and updated as appropriate: allergies, current medications, past family history, past medical history,  past surgical history, past social history  and problem list.  Visual acuity was not assessed per patient preference since she has regular follow up with her ophthalmologist. Hearing and body mass index were assessed and reviewed.   During the course of the visit the patient was educated and counseled about appropriate screening and preventive services including : fall prevention , diabetes screening, nutrition counseling, colorectal cancer screening, and recommended immunizations.    Patient is concerned today about shortness of breath with minimal exertion. She reports that after raising from a seated position and walking to her kitchen she becomes very short of breath. This resolves after several minutes of rest. She occasionally has palpitations but denies chest pain. She is sometimes diaphoretic. She also notes chronic fatigue.   She is also concerned today about persistent rash between her buttocks. She has been treating this with topical nystatin cream with no improvement. Area is described as painful and itchy. It is been present for several months.  Review of Systems  Constitutional: Negative for fever, chills, appetite change, fatigue and unexpected weight change.  HENT: Negative for ear pain, congestion, sore throat, trouble swallowing, neck pain, voice change and sinus pressure.   Eyes: Negative for visual disturbance.  Respiratory: Positive for shortness of breath. Negative for cough, wheezing and stridor.   Cardiovascular: Positive for palpitations. Negative for chest pain and leg swelling.  Gastrointestinal: Negative for nausea, vomiting, abdominal pain,  diarrhea, constipation, blood in stool, abdominal distention and anal bleeding.  Genitourinary: Negative for dysuria and flank pain.  Musculoskeletal: Negative for myalgias, arthralgias and gait  problem.  Skin: Positive for color change and rash.  Neurological: Negative for dizziness and headaches.  Hematological: Negative for adenopathy. Does not bruise/bleed easily.  Psychiatric/Behavioral: Negative for suicidal ideas, sleep disturbance and dysphoric mood. The patient is not nervous/anxious.        Objective:   Physical Exam  Constitutional: She is oriented to person, place, and time. She appears well-developed and well-nourished. No distress.  HENT:  Head: Normocephalic and atraumatic.  Right Ear: External ear normal.  Left Ear: External ear normal.  Nose: Nose normal.  Mouth/Throat: Oropharynx is clear and moist. No oropharyngeal exudate.  Eyes: Conjunctivae are normal. Pupils are equal, round, and reactive to light. Right eye exhibits no discharge. Left eye exhibits no discharge. No scleral icterus.  Neck: Normal range of motion. Neck supple. No tracheal deviation present. No thyromegaly present.  Cardiovascular: Normal rate, regular rhythm, normal heart sounds and intact distal pulses.  Exam reveals no gallop and no friction rub.   No murmur heard. Pulmonary/Chest: Effort normal and breath sounds normal. No accessory muscle usage. Not tachypneic. No respiratory distress. She has no decreased breath sounds. She has no wheezes. She has no rhonchi. She has no rales. She exhibits no tenderness.  Genitourinary: Rectal exam shows external hemorrhoid.  Musculoskeletal: Normal range of motion. She exhibits no edema and no tenderness.  Lymphadenopathy:    She has no cervical adenopathy.  Neurological: She is alert and oriented to person, place, and time. No cranial nerve deficit. She exhibits normal muscle tone. Coordination normal.  Skin: Skin is warm and dry. Rash noted. Rash is maculopapular (between buttocks). She is not diaphoretic. No erythema. No pallor.  Psychiatric: She has a normal mood and affect. Her behavior is normal. Judgment and thought content normal.           Assessment & Plan:

## 2012-12-02 NOTE — Assessment & Plan Note (Signed)
Symptoms of dyspnea with minimal exertion are concerning for cardiac ischemia. Patient has risk factors including hypertension, diabetes, and hyperlipidemia. EKG shows nonspecific changes. Will set up cardiology evaluation. Question if stress test may be helpful for risk stratification.

## 2012-12-05 DIAGNOSIS — J329 Chronic sinusitis, unspecified: Secondary | ICD-10-CM | POA: Diagnosis not present

## 2012-12-22 DIAGNOSIS — N3090 Cystitis, unspecified without hematuria: Secondary | ICD-10-CM | POA: Diagnosis not present

## 2012-12-25 ENCOUNTER — Ambulatory Visit: Payer: Medicare Other | Admitting: Internal Medicine

## 2012-12-26 ENCOUNTER — Encounter: Payer: Self-pay | Admitting: Cardiovascular Disease

## 2012-12-26 ENCOUNTER — Ambulatory Visit (INDEPENDENT_AMBULATORY_CARE_PROVIDER_SITE_OTHER): Payer: Medicare Other | Admitting: Cardiovascular Disease

## 2012-12-26 VITALS — BP 158/70 | HR 77 | Ht 65.0 in | Wt 217.0 lb

## 2012-12-26 DIAGNOSIS — E785 Hyperlipidemia, unspecified: Secondary | ICD-10-CM | POA: Diagnosis not present

## 2012-12-26 DIAGNOSIS — R29898 Other symptoms and signs involving the musculoskeletal system: Secondary | ICD-10-CM

## 2012-12-26 DIAGNOSIS — R9431 Abnormal electrocardiogram [ECG] [EKG]: Secondary | ICD-10-CM | POA: Diagnosis not present

## 2012-12-26 DIAGNOSIS — E118 Type 2 diabetes mellitus with unspecified complications: Secondary | ICD-10-CM

## 2012-12-26 DIAGNOSIS — R0609 Other forms of dyspnea: Secondary | ICD-10-CM

## 2012-12-26 DIAGNOSIS — I10 Essential (primary) hypertension: Secondary | ICD-10-CM

## 2012-12-26 DIAGNOSIS — R0989 Other specified symptoms and signs involving the circulatory and respiratory systems: Secondary | ICD-10-CM

## 2012-12-26 NOTE — Assessment & Plan Note (Signed)
I suspect her cholesterol will improve even further with better diabetes control and weight loss. We have given her a dietary guide today.

## 2012-12-26 NOTE — Assessment & Plan Note (Signed)
We have encouraged continued exercise, careful diet management in an effort to lose weight. 

## 2012-12-26 NOTE — Patient Instructions (Addendum)
You are doing well. No medication changes were made.  Please call us if you have new issues that need to be addressed before your next appt.    

## 2012-12-26 NOTE — Progress Notes (Signed)
Patient ID: Brenda [name], female    DOB: 08-15-1931, 79 y.o.   MRN: [id number]  HPI Comments: Brenda [name] is a very pleasant 79 year old woman with obesity, diabetes type 2, hypertension who presents by referral from Dr. Dan Humphreys for abnormal EKG. She reports history of incontinence, prior upper GI bleed seen by Dr. Markham Jordan.   She reports that she has problems with incontinence. She has seen urology, tried several medications with no significant improvement in her symptoms. Recently had a course of Cipro for 3 days with no improvement. She was given a second antibiotic which she is scheduled to start today.  She denies any significant chest pain or shortness of breath. No lightheadedness or dizziness. Her only complaint is her incontinence and poor gait. History of prior falls.  EKG done through Dr. Dan Humphreys shows normal sinus rhythm with heart rate in the high 50s. No significant ST or T wave changes.. EKG today shows normal sinus rhythm with rate 77 beats a minute, poor R-wave progression to the anterior precordial leads otherwise no other significant findings.  Old EKG from December 2012 showing heart rate 58 beats per minute, no significant ST or T wave changes Hemoglobin A1c 7.1 Total cholesterol 170s, LDL 95     Outpatient Encounter Prescriptions as of 77/18/2014  Medication Sig Dispense Refill  . ferrous sulfate 324 (65 FE) MG TBEC Take 1 tablet by mouth 3 (three) times daily.      Marland Kitchen glipiZIDE (GLUCOTROL XL) 10 MG 24 hr tablet Take 10 mg by mouth 2 (two) times daily.      Marland Kitchen lisinopril (PRINIVIL,ZESTRIL) 10 MG tablet Take 10 mg by mouth daily.      . metFORMIN (GLUCOPHAGE) 1000 MG tablet Take 1,000 mg by mouth 2 (two) times daily with a meal. Take one      . mirtazapine (REMERON) 15 MG tablet Take 1 tablet (15 mg total) by mouth at bedtime.  30 tablet  3  . Nitrofurantoin Monohyd Macro (MACROBID PO) Take 50 mg by mouth daily.      . pantoprazole (PROTONIX) 40 MG tablet Take 40 mg by mouth 2  (two) times daily.      . sucralfate (CARAFATE) 1 G tablet Take 1 g by mouth 2 (two) times daily.        Review of Systems  Constitutional: Negative.   HENT: Negative.   Eyes: Negative.   Respiratory: Negative.   Cardiovascular: Negative.   Gastrointestinal: Negative.   Musculoskeletal: Positive for gait problem.  Skin: Negative.   Neurological: Negative.   Psychiatric/Behavioral: Negative.   All other systems reviewed and are negative.    BP 158/70  Pulse 77  Ht 5\' 5"  (1.651 m)  Wt 217 lb (98.431 kg)  BMI 36.11 kg/m2  Physical Exam  Nursing note and vitals reviewed. Constitutional: She is oriented to person, place, and time. She appears well-developed and well-nourished.  HENT:  Head: Normocephalic.  Nose: Nose normal.  Mouth/Throat: Oropharynx is clear and moist.  Eyes: Conjunctivae are normal. Pupils are equal, round, and reactive to light.  Neck: Normal range of motion. Neck supple. No JVD present.  Cardiovascular: Normal rate, regular rhythm, S1 normal, S2 normal, normal heart sounds and intact distal pulses.  Exam reveals no gallop and no friction rub.   No murmur heard. Pulmonary/Chest: Effort normal and breath sounds normal. No respiratory distress. She has no wheezes. She has no rales. She exhibits no tenderness.  Abdominal: Soft. Bowel sounds are normal. She exhibits no  distension. There is no tenderness.  Musculoskeletal: Normal range of motion. She exhibits no edema and no tenderness.  Lymphadenopathy:    She has no cervical adenopathy.  Neurological: She is alert and oriented to person, place, and time. Coordination normal.  Skin: Skin is warm and dry. No rash noted. No erythema.  Psychiatric: She has a normal mood and affect. Her behavior is normal. Judgment and thought content normal.    Assessment and Plan

## 2012-12-26 NOTE — Assessment & Plan Note (Signed)
Her poor gait and leg weakness is a major issue. Difficulty climbing steps such as onto the exam table. She does not want a cane. We have encouraged she consider purchasing a cane given her history of falls.

## 2012-12-26 NOTE — Assessment & Plan Note (Signed)
Mild shortness of breath. EKG is essentially normal in 2012, and more recently. Clinical exam is essentially benign. Nonsmoker, cholesterol in a reasonable range. Only risk factor is diabetes. Suspect her shortness of breath is from deconditioning and obesity. We have given her our contact information. No testing at this time. We have suggested she call our office for any worsening shortness of breath or chest tightness or symptoms concerning for ischemia.

## 2012-12-26 NOTE — Assessment & Plan Note (Signed)
Blood pressure mildly elevated. Recheck of her pressure was improved. We have suggested she monitor her blood pressure periodically.

## 2013-02-09 DIAGNOSIS — R350 Frequency of micturition: Secondary | ICD-10-CM | POA: Diagnosis not present

## 2013-02-09 DIAGNOSIS — N3941 Urge incontinence: Secondary | ICD-10-CM | POA: Diagnosis not present

## 2013-03-04 ENCOUNTER — Telehealth: Payer: Self-pay | Admitting: Internal Medicine

## 2013-03-04 ENCOUNTER — Ambulatory Visit (INDEPENDENT_AMBULATORY_CARE_PROVIDER_SITE_OTHER): Payer: Medicare Other | Admitting: *Deleted

## 2013-03-04 DIAGNOSIS — E538 Deficiency of other specified B group vitamins: Secondary | ICD-10-CM

## 2013-03-04 MED ORDER — CYANOCOBALAMIN 1000 MCG/ML IJ SOLN
1000.0000 ug | Freq: Once | INTRAMUSCULAR | Status: AC
  Administered 2013-03-04: 1000 ug via INTRAMUSCULAR

## 2013-03-04 NOTE — Telephone Encounter (Signed)
Thought she was to have blood work done but just had it done in March. At that time she was supposed to have scheduled a follow up appointment but didn't. Appointment scheduled for 8/3. Does she need any labs prior to that appointment.

## 2013-03-04 NOTE — Telephone Encounter (Signed)
Had wellness exam in March and labs were improved, does she need to come back in .

## 2013-03-04 NOTE — Telephone Encounter (Signed)
It should be fine for her to come 8/3 as long as no new issues. She needs CMP and A1c prior to that visit. 250.00

## 2013-03-04 NOTE — Telephone Encounter (Signed)
Pt called to see when she needs to come back for lab work Please advise

## 2013-03-05 NOTE — Telephone Encounter (Signed)
LMTCB

## 2013-03-06 NOTE — Telephone Encounter (Signed)
Patient informed and verbalized understanding

## 2013-03-14 LAB — HM DIABETES FOOT EXAM

## 2013-03-25 ENCOUNTER — Encounter: Payer: Self-pay | Admitting: Adult Health

## 2013-03-25 ENCOUNTER — Ambulatory Visit (INDEPENDENT_AMBULATORY_CARE_PROVIDER_SITE_OTHER): Payer: Medicare Other | Admitting: Adult Health

## 2013-03-25 VITALS — BP 122/66 | HR 73 | Temp 98.1°F | Resp 12 | Wt 210.0 lb

## 2013-03-25 DIAGNOSIS — G589 Mononeuropathy, unspecified: Secondary | ICD-10-CM

## 2013-03-25 DIAGNOSIS — R209 Unspecified disturbances of skin sensation: Secondary | ICD-10-CM | POA: Diagnosis not present

## 2013-03-25 DIAGNOSIS — G629 Polyneuropathy, unspecified: Secondary | ICD-10-CM

## 2013-03-25 DIAGNOSIS — R252 Cramp and spasm: Secondary | ICD-10-CM | POA: Insufficient documentation

## 2013-03-25 DIAGNOSIS — R202 Paresthesia of skin: Secondary | ICD-10-CM

## 2013-03-25 LAB — BASIC METABOLIC PANEL
CO2: 25 mEq/L (ref 19–32)
Calcium: 9.7 mg/dL (ref 8.4–10.5)
Creatinine, Ser: 0.9 mg/dL (ref 0.4–1.2)
GFR: 60.92 mL/min (ref >60.00)
Sodium: 136 mEq/L (ref 135–145)

## 2013-03-25 LAB — MAGNESIUM: Magnesium: 1.9 mg/dL (ref 1.5–2.5)

## 2013-03-25 MED ORDER — GABAPENTIN 100 MG PO CAPS: 100.0000 mg | ORAL_CAPSULE | Freq: Every day | ORAL | Status: DC

## 2013-03-25 NOTE — Patient Instructions (Addendum)
  Please have your lab work done prior to leaving the office.  Start Neurontin (Gabapentin) 100 mg at bedtime for cramps. We can increase this dose if necessary. We first start off on a lower dose.  Stretch the muscles in your legs at least once daily.  Drink fluids to stay hydrated specially on very hot days.

## 2013-03-25 NOTE — Assessment & Plan Note (Signed)
Restart Neurontin at 100 mg at bedtime. Adjust dose as necessary. Check metabolic panel and magnesium level. B12 was recently checked and was within normal limits. Patient receiving monthly B12 injections. Discussed anecdotal accounts of regular yellow mustard helping with muscle cramps. It may be the turmeric included for its yellow color, or it could be the vinegar or the salt. It is worth a try.

## 2013-03-25 NOTE — Progress Notes (Signed)
  Subjective:    Patient ID: Brenda [name], female    DOB: October 28, 1930, 79 y.o.   MRN: [id number]  HPI  Patient is a pleasant 79 year old female with history of diabetes type 2, hypertension, hyperlipidemia, pernicious anemia on monthly B12 injections who presents to clinic with complaints of bilateral leg cramps. She reports her toes curl and cramps are very painful. 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 Cramps are occurring nightly. She reports blood sugars at bedtime are running in the 140s and a.m. blood sugars are running anywhere between 80-110.   Current Outpatient Prescriptions on File Prior to Visit  Medication Sig Dispense Refill  . ferrous sulfate 324 (65 FE) MG TBEC Take 1 tablet by mouth 3 (three) times daily.      Marland Kitchen glipiZIDE (GLUCOTROL XL) 10 MG 24 hr tablet Take 10 mg by mouth 2 (two) times daily.      Marland Kitchen lisinopril (PRINIVIL,ZESTRIL) 10 MG tablet Take 10 mg by mouth daily.      . metFORMIN (GLUCOPHAGE) 1000 MG tablet Take 1,000 mg by mouth 2 (two) times daily with a meal. Take one      . mirtazapine (REMERON) 15 MG tablet Take 1 tablet (15 mg total) by mouth at bedtime.  30 tablet  3  . Nitrofurantoin Monohyd Macro (MACROBID PO) Take 50 mg by mouth daily.      . pantoprazole (PROTONIX) 40 MG tablet Take 40 mg by mouth 2 (two) times daily.      . sucralfate (CARAFATE) 1 G tablet Take 1 g by mouth 2 (two) times daily.       No current facility-administered medications on file prior to visit.    Review of Systems  Respiratory: Negative.   Cardiovascular: Negative.   Musculoskeletal: Positive for arthralgias. Negative for myalgias.       Nightly muscle cramps in legs.       Objective:   Physical Exam  Constitutional: She is oriented to person, place, and time.  Overweight, pleasant female in no apparent distress  Cardiovascular: Normal rate and regular rhythm.   Pulmonary/Chest: Effort normal. No  respiratory distress.  Musculoskeletal: She exhibits no edema and no tenderness.  Difficulty getting from a sitting to a standing position. Ambulates without assistance.  Neurological: She is alert and oriented to person, place, and time.  Skin: Skin is warm and dry.  Psychiatric: She has a normal mood and affect. Her behavior is normal. Judgment and thought content normal.          Assessment & Plan:

## 2013-03-31 DIAGNOSIS — D649 Anemia, unspecified: Secondary | ICD-10-CM | POA: Diagnosis not present

## 2013-03-31 DIAGNOSIS — K2940 Chronic atrophic gastritis without bleeding: Secondary | ICD-10-CM | POA: Diagnosis not present

## 2013-04-10 ENCOUNTER — Other Ambulatory Visit (INDEPENDENT_AMBULATORY_CARE_PROVIDER_SITE_OTHER): Payer: Medicare Other

## 2013-04-10 DIAGNOSIS — E538 Deficiency of other specified B group vitamins: Secondary | ICD-10-CM | POA: Diagnosis not present

## 2013-04-10 DIAGNOSIS — E119 Type 2 diabetes mellitus without complications: Secondary | ICD-10-CM

## 2013-04-10 LAB — COMPREHENSIVE METABOLIC PANEL
ALT: 12 U/L (ref 0–35)
AST: 16 U/L (ref 0–37)
Albumin: 3.8 g/dL (ref 3.5–5.2)
Alkaline Phosphatase: 71 U/L (ref 39–117)
Glucose, Bld: 104 mg/dL — ABNORMAL HIGH (ref 70–99)
Potassium: 4.1 mEq/L (ref 3.5–5.1)
Sodium: 138 mEq/L (ref 135–145)
Total Bilirubin: 0.5 mg/dL (ref 0.3–1.2)
Total Protein: 7 g/dL (ref 6.0–8.3)

## 2013-04-10 MED ORDER — CYANOCOBALAMIN 1000 MCG/ML IJ SOLN
1000.0000 ug | Freq: Once | INTRAMUSCULAR | Status: AC
  Administered 2013-04-10: 1000 ug via INTRAMUSCULAR

## 2013-04-10 NOTE — Addendum Note (Signed)
Addended by: Theola Sequin on: 04/10/2013 11:30 AM   Modules accepted: Orders

## 2013-04-14 ENCOUNTER — Telehealth: Payer: Self-pay | Admitting: Internal Medicine

## 2013-04-14 ENCOUNTER — Encounter: Payer: Self-pay | Admitting: Internal Medicine

## 2013-04-14 ENCOUNTER — Ambulatory Visit (INDEPENDENT_AMBULATORY_CARE_PROVIDER_SITE_OTHER): Payer: Medicare Other | Admitting: Internal Medicine

## 2013-04-14 VITALS — BP 112/60 | HR 81 | Temp 98.5°F | Wt 214.0 lb

## 2013-04-14 DIAGNOSIS — I10 Essential (primary) hypertension: Secondary | ICD-10-CM | POA: Diagnosis not present

## 2013-04-14 DIAGNOSIS — D510 Vitamin B12 deficiency anemia due to intrinsic factor deficiency: Secondary | ICD-10-CM | POA: Diagnosis not present

## 2013-04-14 DIAGNOSIS — E118 Type 2 diabetes mellitus with unspecified complications: Secondary | ICD-10-CM

## 2013-04-14 DIAGNOSIS — M545 Low back pain: Secondary | ICD-10-CM

## 2013-04-14 DIAGNOSIS — G8929 Other chronic pain: Secondary | ICD-10-CM

## 2013-04-14 LAB — CBC WITH DIFFERENTIAL/PLATELET
Basophils Absolute: 0 10*3/uL (ref 0.0–0.1)
Eosinophils Relative: 5 % (ref 0.0–5.0)
HCT: 37.7 % (ref 36.0–46.0)
Lymphocytes Relative: 25.8 % (ref 12.0–46.0)
Lymphs Abs: 1.4 10*3/uL (ref 0.7–4.0)
Monocytes Relative: 8.1 % (ref 3.0–12.0)
Neutrophils Relative %: 60.6 % (ref 43.0–77.0)
Platelets: 252 10*3/uL (ref 150.0–400.0)
RDW: 12.6 % (ref 11.5–14.6)
WBC: 5.5 10*3/uL (ref 4.5–10.5)

## 2013-04-14 LAB — HM DIABETES FOOT EXAM: HM Diabetic Foot Exam: NORMAL

## 2013-04-14 MED ORDER — METFORMIN HCL 1000 MG PO TABS: 1000.0000 mg | ORAL_TABLET | Freq: Two times a day (BID) | ORAL | Status: DC

## 2013-04-14 MED ORDER — LISINOPRIL 10 MG PO TABS: 10.0000 mg | ORAL_TABLET | Freq: Every day | ORAL | Status: DC

## 2013-04-14 NOTE — Progress Notes (Signed)
Subjective:    Patient ID: Brenda [name], female    DOB: 1931-08-04, 79 y.o.   MRN: [id number]  HPI 79YO female with h/o obesity, HTN, DM, chronic low back pain presents for follow up.  Low back pain - Symptoms persistent. Described as aching which is made worse with movement and activity. Resolved with rest and occasional use of Tylenol. H/o abnormal MRI in the past, per pt with diagnosis of spinal stenosis. Has not recently follow up with ortho.  DM - Notes recent dietary indiscretion with increased intake of sweets. Does not check sugars. Compliant with medications.  No other new concerns today.  Outpatient Encounter Prescriptions as of 04/14/2013  Medication Sig Dispense Refill  . ferrous sulfate 324 (65 FE) MG TBEC Take 1 tablet by mouth 3 (three) times daily.      . fesoterodine (TOVIAZ) 8 MG TB24 Take 8 mg by mouth daily.      Marland Kitchen gabapentin (NEURONTIN) 100 MG capsule Take 1 capsule (100 mg total) by mouth at bedtime.  30 capsule  3  . glipiZIDE (GLUCOTROL XL) 10 MG 24 hr tablet Take 10 mg by mouth 2 (two) times daily.      Marland Kitchen lisinopril (PRINIVIL,ZESTRIL) 10 MG tablet Take 1 tablet (10 mg total) by mouth daily.  90 tablet  4  . metFORMIN (GLUCOPHAGE) 1000 MG tablet Take 1 tablet (1,000 mg total) by mouth 2 (two) times daily with a meal. Take one  180 tablet  4  . mirtazapine (REMERON) 15 MG tablet Take 1 tablet (15 mg total) by mouth at bedtime.  30 tablet  3  . Nitrofurantoin Monohyd Macro (MACROBID PO) Take 50 mg by mouth daily.      . pantoprazole (PROTONIX) 40 MG tablet Take 40 mg by mouth 2 (two) times daily.      . sucralfate (CARAFATE) 1 G tablet Take 1 g by mouth 2 (two) times daily.       No facility-administered encounter medications on file as of 04/14/2013.   BP 112/60  Pulse 81  Temp(Src) 98.5 F (36.9 C) (Oral)  Wt 214 lb (97.07 kg)  BMI 35.61 kg/m2  SpO2 95%  Review of Systems  Constitutional: Negative for fever, chills, appetite change, fatigue and unexpected  weight change.  HENT: Negative for ear pain, congestion, sore throat, trouble swallowing, neck pain, voice change and sinus pressure.   Eyes: Negative for visual disturbance.  Respiratory: Negative for cough, shortness of breath, wheezing and stridor.   Cardiovascular: Negative for chest pain, palpitations and leg swelling.  Gastrointestinal: Negative for nausea, vomiting, abdominal pain, diarrhea, constipation, blood in stool, abdominal distention and anal bleeding.  Genitourinary: Negative for dysuria and flank pain.  Musculoskeletal: Positive for myalgias, back pain and arthralgias. Negative for gait problem.  Skin: Negative for color change and rash.  Neurological: Negative for dizziness and headaches.  Hematological: Negative for adenopathy. Does not bruise/bleed easily.  Psychiatric/Behavioral: Negative for suicidal ideas, sleep disturbance and dysphoric mood. The patient is not nervous/anxious.        Objective:   Physical Exam  Constitutional: She is oriented to person, place, and time. She appears well-developed and well-nourished. No distress.  HENT:  Head: Normocephalic and atraumatic.  Right Ear: External ear normal.  Left Ear: External ear normal.  Nose: Nose normal.  Mouth/Throat: Oropharynx is clear and moist. No oropharyngeal exudate.  Eyes: Conjunctivae are normal. Pupils are equal, round, and reactive to light. Right eye exhibits no discharge. Left eye exhibits no  discharge. No scleral icterus.  Neck: Normal range of motion. Neck supple. No tracheal deviation present. No thyromegaly present.  Cardiovascular: Normal rate, regular rhythm, normal heart sounds and intact distal pulses.  Exam reveals no gallop and no friction rub.   No murmur heard. Pulmonary/Chest: Effort normal and breath sounds normal. No accessory muscle usage. Not tachypneic. No respiratory distress. She has no decreased breath sounds. She has no wheezes. She has no rhonchi. She has no rales. She  exhibits no tenderness.  Musculoskeletal: She exhibits no edema and no tenderness.       Right knee: She exhibits normal range of motion and no swelling.       Left knee: She exhibits normal range of motion and no swelling.       Lumbar back: She exhibits decreased range of motion and pain. She exhibits no tenderness, no bony tenderness and no swelling.  Lymphadenopathy:    She has no cervical adenopathy.  Neurological: She is alert and oriented to person, place, and time. No cranial nerve deficit. She exhibits normal muscle tone. Coordination normal.  Skin: Skin is warm and dry. No rash noted. She is not diaphoretic. No erythema. No pallor.  Psychiatric: She has a normal mood and affect. Her behavior is normal. Judgment and thought content normal.          Assessment & Plan:

## 2013-04-14 NOTE — Telephone Encounter (Signed)
Pt returning a call, possibly regarding results.  Pt has appt today at 11:30, confirmed.

## 2013-04-14 NOTE — Telephone Encounter (Signed)
Patient was given lab results at her visit today

## 2013-04-14 NOTE — Telephone Encounter (Signed)
Pt unsure if she needs labs before next f/u appt in November, or 6 mth from now.  Please advise.

## 2013-04-14 NOTE — Telephone Encounter (Signed)
3 months

## 2013-04-14 NOTE — Telephone Encounter (Signed)
Fwd to Dr. Walker 

## 2013-04-14 NOTE — Assessment & Plan Note (Signed)
BP Readings from Last 3 Encounters:  04/14/13 112/60  03/25/13 122/66  12/26/12 158/70   BP well controlled on Lisinopril. Will continue.

## 2013-04-14 NOTE — Assessment & Plan Note (Signed)
Lab Results  Component Value Date   HGBA1C 8.2* 04/10/2013   BG elevated on recent labs. Pt notes dietary indiscretion recently. Encouraged better compliance with diet. Discussed adding medication, but she would prefer to see if she can control with diet alone. Will plan repeat A1c in 07/2013. She will also follow up with her endocrinologist next month.

## 2013-04-14 NOTE — Assessment & Plan Note (Signed)
Persistent symptoms of low back pain and bilateral knee pain consistent with osteoarthritis. Also has reported h/o spinal stenosis of the lumbar spine. Discussed referral back to orthopedics and repeat MRI. Pt would like to hold off for now. Will plan to continue Tylenol prn pain. Follow up 3 months and prn.

## 2013-04-15 ENCOUNTER — Telehealth: Payer: Self-pay | Admitting: Internal Medicine

## 2013-04-15 NOTE — Telephone Encounter (Signed)
Left message to call back  

## 2013-04-15 NOTE — Telephone Encounter (Signed)
Pt returned call.  Lab appt scheduled. °

## 2013-04-29 ENCOUNTER — Telehealth: Payer: Self-pay | Admitting: Internal Medicine

## 2013-04-29 DIAGNOSIS — I10 Essential (primary) hypertension: Secondary | ICD-10-CM

## 2013-04-29 MED ORDER — LISINOPRIL 10 MG PO TABS: 10.0000 mg | ORAL_TABLET | Freq: Every day | ORAL | Status: DC

## 2013-04-29 NOTE — Telephone Encounter (Signed)
Rx sent to pharmacy on file.

## 2013-04-29 NOTE — Telephone Encounter (Signed)
Pt is needing refill on Lisinopril and she uses [facility] in [place]

## 2013-05-14 ENCOUNTER — Ambulatory Visit (INDEPENDENT_AMBULATORY_CARE_PROVIDER_SITE_OTHER): Payer: Medicare Other | Admitting: *Deleted

## 2013-05-14 DIAGNOSIS — E538 Deficiency of other specified B group vitamins: Secondary | ICD-10-CM | POA: Diagnosis not present

## 2013-05-14 MED ORDER — CYANOCOBALAMIN 1000 MCG/ML IJ SOLN
1000.0000 ug | Freq: Once | INTRAMUSCULAR | Status: AC
  Administered 2013-05-14: 1000 ug via INTRAMUSCULAR

## 2013-05-27 DIAGNOSIS — E119 Type 2 diabetes mellitus without complications: Secondary | ICD-10-CM | POA: Diagnosis not present

## 2013-05-28 DIAGNOSIS — E669 Obesity, unspecified: Secondary | ICD-10-CM | POA: Diagnosis not present

## 2013-05-28 DIAGNOSIS — R809 Proteinuria, unspecified: Secondary | ICD-10-CM | POA: Diagnosis not present

## 2013-05-28 DIAGNOSIS — E119 Type 2 diabetes mellitus without complications: Secondary | ICD-10-CM | POA: Diagnosis not present

## 2013-06-10 ENCOUNTER — Ambulatory Visit: Payer: Medicare Other

## 2013-06-10 ENCOUNTER — Ambulatory Visit: Payer: Self-pay | Admitting: Family Medicine

## 2013-06-10 DIAGNOSIS — R51 Headache: Secondary | ICD-10-CM | POA: Diagnosis not present

## 2013-06-10 DIAGNOSIS — N809 Endometriosis, unspecified: Secondary | ICD-10-CM | POA: Diagnosis not present

## 2013-06-10 DIAGNOSIS — Z90710 Acquired absence of both cervix and uterus: Secondary | ICD-10-CM | POA: Diagnosis not present

## 2013-06-10 DIAGNOSIS — E119 Type 2 diabetes mellitus without complications: Secondary | ICD-10-CM | POA: Diagnosis not present

## 2013-06-10 DIAGNOSIS — Z79899 Other long term (current) drug therapy: Secondary | ICD-10-CM | POA: Diagnosis not present

## 2013-06-10 DIAGNOSIS — N3941 Urge incontinence: Secondary | ICD-10-CM | POA: Diagnosis not present

## 2013-06-10 DIAGNOSIS — I10 Essential (primary) hypertension: Secondary | ICD-10-CM | POA: Diagnosis not present

## 2013-06-10 LAB — BASIC METABOLIC PANEL
Anion Gap: 10 (ref 7–16)
BUN: 10 mg/dL (ref 7–18)
Calcium, Total: 9.5 mg/dL (ref 8.5–10.1)
Chloride: 101 mmol/L (ref 98–107)
Co2: 28 mmol/L (ref 21–32)
Creatinine: 0.89 mg/dL (ref 0.60–1.30)
EGFR (African American): >60
EGFR (Non-African Amer.): >60
Glucose: 232 mg/dL — ABNORMAL HIGH (ref 65–99)
Osmolality: 284 (ref 275–301)
Potassium: 3.9 mmol/L (ref 3.5–5.1)
Sodium: 139 mmol/L (ref 136–145)

## 2013-06-22 ENCOUNTER — Other Ambulatory Visit: Payer: Self-pay | Admitting: Internal Medicine

## 2013-06-23 NOTE — Telephone Encounter (Signed)
Eprescribed.

## 2013-06-25 ENCOUNTER — Ambulatory Visit (INDEPENDENT_AMBULATORY_CARE_PROVIDER_SITE_OTHER): Payer: Medicare Other | Admitting: Internal Medicine

## 2013-06-25 ENCOUNTER — Encounter: Payer: Self-pay | Admitting: Internal Medicine

## 2013-06-25 VITALS — BP 124/72 | HR 64 | Temp 98.1°F | Wt 211.0 lb

## 2013-06-25 DIAGNOSIS — E538 Deficiency of other specified B group vitamins: Secondary | ICD-10-CM

## 2013-06-25 DIAGNOSIS — M545 Low back pain: Secondary | ICD-10-CM

## 2013-06-25 DIAGNOSIS — Z23 Encounter for immunization: Secondary | ICD-10-CM | POA: Diagnosis not present

## 2013-06-25 DIAGNOSIS — G8929 Other chronic pain: Secondary | ICD-10-CM

## 2013-06-25 DIAGNOSIS — I10 Essential (primary) hypertension: Secondary | ICD-10-CM | POA: Diagnosis not present

## 2013-06-25 LAB — BASIC METABOLIC PANEL
CO2: 28 mEq/L (ref 19–32)
Calcium: 10 mg/dL (ref 8.4–10.5)
Chloride: 100 mEq/L (ref 96–112)
Glucose, Bld: 143 mg/dL — ABNORMAL HIGH (ref 70–99)
Potassium: 4.4 mEq/L (ref 3.5–5.1)
Sodium: 138 mEq/L (ref 135–145)

## 2013-06-25 MED ORDER — LISINOPRIL 20 MG PO TABS: 20.0000 mg | ORAL_TABLET | Freq: Every day | ORAL | Status: DC

## 2013-06-25 MED ORDER — CYANOCOBALAMIN 1000 MCG/ML IJ SOLN
1000.0000 ug | Freq: Once | INTRAMUSCULAR | Status: AC
  Administered 2013-06-25: 1000 ug via INTRAMUSCULAR

## 2013-06-25 NOTE — Progress Notes (Signed)
Subjective:    Patient ID: Brenda [name], female    DOB: 1931/06/27, 79 y.o.   MRN: [id number]  HPI 79 year old female with history of hypertension, diabetes, obesity presents for followup after recent evaluation at urgent care. She was seen at urgent care because of elevated blood pressure. She was told to increase lisinopril from 10 mg to 20 mg daily. Since that time, blood pressure has been generally better controlled except for a few readings where her diastolic blood pressure was near 100 on her home blood pressure monitor. She denies any headache, chest pain. She attributes elevated readings to increased stress. 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  She is also concerned about persistent low back pain which is described as aching with any ambulation or standing. She had MRI lumbar spine in the distant past and more recently a plain x-ray of her lumbar spine which showed degenerative changes. She has not recently been seen by orthopedics. She is not currently taking any medication for pain, except for an occasional Tylenol.  She also reports some difficulty taking her Toviaz. She reports dry mouth with the 8 mg dose of this medication. She discussed this with her urologist to encouraged her to take 4 mg. However at the 4 mg dose she continues to have urinary incontinence. She is now alternating 4 and 8 mg.  Outpatient Encounter Prescriptions as of 06/25/2013  Medication Sig Dispense Refill  . ferrous sulfate 324 (65 FE) MG TBEC Take 1 tablet by mouth 3 (three) times daily.      . fesoterodine (TOVIAZ) 8 MG TB24 Take 8 mg by mouth daily.      Marland Kitchen gabapentin (NEURONTIN) 100 MG capsule Take 1 capsule (100 mg total) by mouth at bedtime.  30 capsule  3  . glipiZIDE (GLUCOTROL XL) 10 MG 24 hr tablet TAKE ONE TABLET BY MOUTH TWICE DAILY  180 tablet  1  . lisinopril (PRINIVIL,ZESTRIL) 20 MG tablet Take 1 tablet (20 mg total) by mouth daily.  90 tablet  3  . metFORMIN (GLUCOPHAGE)  1000 MG tablet Take 1 tablet (1,000 mg total) by mouth 2 (two) times daily with a meal. Take one  180 tablet  4  . pantoprazole (PROTONIX) 40 MG tablet Take 40 mg by mouth 2 (two) times daily.      . sucralfate (CARAFATE) 1 G tablet Take 1 g by mouth 2 (two) times daily.      . mirtazapine (REMERON) 15 MG tablet Take 1 tablet (15 mg total) by mouth at bedtime.  30 tablet  3  . [EXPIRED] cyanocobalamin ((VITAMIN B-12)) injection 1,000 mcg        No facility-administered encounter medications on file as of 06/25/2013.   BP 124/72  Pulse 64  Temp(Src) 98.1 F (36.7 C) (Oral)  Wt 211 lb (95.709 kg)  BMI 35.11 kg/m2  SpO2 95%  Review of Systems  Constitutional: Negative for fever, chills, appetite change, fatigue and unexpected weight change.  HENT: Negative for congestion, ear pain, sinus pressure, sore throat, trouble swallowing and voice change.   Eyes: Negative for visual disturbance.  Respiratory: Negative for cough, shortness of breath, wheezing and stridor.   Cardiovascular: Negative for chest pain, palpitations and leg swelling.  Gastrointestinal: Negative for nausea, vomiting, abdominal pain, diarrhea, constipation, blood in stool, abdominal distention and anal bleeding.  Genitourinary: Negative for dysuria and flank pain.  Musculoskeletal: Positive for arthralgias, back pain and myalgias. Negative for gait problem and neck pain.  Skin: Negative for color change and rash.  Neurological: Negative for dizziness and headaches.  Hematological: Negative for adenopathy. Does not bruise/bleed easily.  Psychiatric/Behavioral: Negative for suicidal ideas, sleep disturbance and dysphoric mood. The patient is not nervous/anxious.        Objective:   Physical Exam  Constitutional: She is oriented to person, place, and time. She appears well-developed and well-nourished. No distress.  HENT:  Head: Normocephalic and atraumatic.  Right Ear: External ear normal.  Left Ear: External ear  normal.  Nose: Nose normal.  Mouth/Throat: Oropharynx is clear and moist. No oropharyngeal exudate.  Eyes: Conjunctivae are normal. Pupils are equal, round, and reactive to light. Right eye exhibits no discharge. Left eye exhibits no discharge. No scleral icterus.  Neck: Normal range of motion. Neck supple. No tracheal deviation present. No thyromegaly present.  Cardiovascular: Normal rate, regular rhythm, normal heart sounds and intact distal pulses.  Exam reveals no gallop and no friction rub.   No murmur heard. Pulmonary/Chest: Effort normal and breath sounds normal. No accessory muscle usage. Not tachypneic. No respiratory distress. She has no decreased breath sounds. She has no wheezes. She has no rhonchi. She has no rales. She exhibits no tenderness.  Musculoskeletal: She exhibits no edema and no tenderness.       Lumbar back: She exhibits decreased range of motion and pain. She exhibits no tenderness and no bony tenderness.  Lymphadenopathy:    She has no cervical adenopathy.  Neurological: She is alert and oriented to person, place, and time. No cranial nerve deficit. She exhibits normal muscle tone. Coordination normal.  Skin: Skin is warm and dry. No rash noted. She is not diaphoretic. No erythema. No pallor.  Psychiatric: She has a normal mood and affect. Her behavior is normal. Judgment and thought content normal.          Assessment & Plan:

## 2013-06-25 NOTE — Assessment & Plan Note (Signed)
Persistent symptoms. Reviewed plain xray from 2013, which showed degenerative changes. Discussed potentially repeating MRI lumbar spine and/or referral to back pain specialist, Dr. Yves Dill. Pt would like to hold off for now.

## 2013-06-25 NOTE — Assessment & Plan Note (Signed)
BP Readings from Last 3 Encounters:  06/25/13 124/72  04/14/13 112/60  03/25/13 122/66   BP well controlled today on higher dose of Lisinopril, 20mg  daily, which was started at Urgent Care. Renal function and electrolytes normal. Will continue.

## 2013-06-26 ENCOUNTER — Encounter: Payer: Self-pay | Admitting: *Deleted

## 2013-07-02 ENCOUNTER — Ambulatory Visit: Payer: Self-pay | Admitting: Emergency Medicine

## 2013-07-02 DIAGNOSIS — Z90710 Acquired absence of both cervix and uterus: Secondary | ICD-10-CM | POA: Diagnosis not present

## 2013-07-02 DIAGNOSIS — Z79899 Other long term (current) drug therapy: Secondary | ICD-10-CM | POA: Diagnosis not present

## 2013-07-02 DIAGNOSIS — N949 Unspecified condition associated with female genital organs and menstrual cycle: Secondary | ICD-10-CM | POA: Diagnosis not present

## 2013-07-02 DIAGNOSIS — M549 Dorsalgia, unspecified: Secondary | ICD-10-CM | POA: Diagnosis not present

## 2013-07-02 DIAGNOSIS — R5381 Other malaise: Secondary | ICD-10-CM | POA: Diagnosis not present

## 2013-07-02 DIAGNOSIS — R509 Fever, unspecified: Secondary | ICD-10-CM | POA: Diagnosis not present

## 2013-07-02 DIAGNOSIS — R350 Frequency of micturition: Secondary | ICD-10-CM | POA: Diagnosis not present

## 2013-07-02 DIAGNOSIS — R300 Dysuria: Secondary | ICD-10-CM | POA: Diagnosis not present

## 2013-07-02 LAB — CBC WITH DIFFERENTIAL/PLATELET
Basophil #: 0 10*3/uL (ref 0.0–0.1)
Basophil %: 0.2 %
Eosinophil #: 0 10*3/uL (ref 0.0–0.7)
Eosinophil %: 0.1 %
HCT: 37.2 % (ref 35.0–47.0)
HGB: 12.3 g/dL (ref 12.0–16.0)
Lymphocyte #: 0.4 10*3/uL — ABNORMAL LOW (ref 1.0–3.6)
Lymphocyte %: 3.2 %
MCH: 34.1 pg — ABNORMAL HIGH (ref 26.0–34.0)
MCHC: 33.2 g/dL (ref 32.0–36.0)
MCV: 103 fL — ABNORMAL HIGH (ref 80–100)
Monocyte #: 0.5 x10 3/mm (ref 0.2–0.9)
Monocyte %: 4.2 %
Neutrophil #: 10.9 10*3/uL — ABNORMAL HIGH (ref 1.4–6.5)
Neutrophil %: 92.3 %
Platelet: 243 10*3/uL (ref 150–440)
RBC: 3.62 10*6/uL — ABNORMAL LOW (ref 3.80–5.20)
RDW: 12.1 % (ref 11.5–14.5)
WBC: 11.8 10*3/uL — ABNORMAL HIGH (ref 3.6–11.0)

## 2013-07-02 LAB — URINALYSIS, COMPLETE
Bilirubin,UR: NEGATIVE
Glucose,UR: NEGATIVE mg/dL (ref 0–75)
Nitrite: POSITIVE
Ph: 7.5 (ref 4.5–8.0)
Protein: >=2000
Specific Gravity: 1.015 (ref 1.003–1.030)
WBC UR: >30 /HPF (ref 0–5)

## 2013-07-02 LAB — COMPREHENSIVE METABOLIC PANEL
Albumin: 3.5 g/dL (ref 3.4–5.0)
Alkaline Phosphatase: 95 U/L (ref 50–136)
Anion Gap: 13 (ref 7–16)
BUN: 10 mg/dL (ref 7–18)
Bilirubin,Total: 0.5 mg/dL (ref 0.2–1.0)
Calcium, Total: 9.2 mg/dL (ref 8.5–10.1)
Chloride: 99 mmol/L (ref 98–107)
Co2: 25 mmol/L (ref 21–32)
Creatinine: 1.04 mg/dL (ref 0.60–1.30)
EGFR (African American): 59 — ABNORMAL LOW
EGFR (Non-African Amer.): 51 — ABNORMAL LOW
Glucose: 191 mg/dL — ABNORMAL HIGH (ref 65–99)
Osmolality: 278 (ref 275–301)
Potassium: 3.8 mmol/L (ref 3.5–5.1)
SGOT(AST): 14 U/L — ABNORMAL LOW (ref 15–37)
SGPT (ALT): 10 U/L — ABNORMAL LOW (ref 12–78)
Sodium: 137 mmol/L (ref 136–145)
Total Protein: 7.2 g/dL (ref 6.4–8.2)

## 2013-07-06 ENCOUNTER — Telehealth: Payer: Self-pay | Admitting: Internal Medicine

## 2013-07-06 NOTE — Telephone Encounter (Signed)
Pt wants to inform Dr. Dan Humphreys that last week on Thursday she went to urgent care, was diagnosed with UTI and yeast infection.  States she was shaky, had a fever of 103.0, and is really bad shape.  Pt states she was given a shot in her hip and prescribed cipro x7 days.  Last cipro will be taken on 10/30, Thursday.  Pt was advised by urgent care to f/u with her PCP when cipro completed.  Appt made with Raquel 10/30 1:30 p.m.

## 2013-07-06 NOTE — Telephone Encounter (Signed)
FYI to Dr. Walker 

## 2013-07-09 ENCOUNTER — Ambulatory Visit: Payer: Medicare Other | Admitting: Adult Health

## 2013-07-09 ENCOUNTER — Ambulatory Visit (INDEPENDENT_AMBULATORY_CARE_PROVIDER_SITE_OTHER): Payer: Medicare Other | Admitting: Internal Medicine

## 2013-07-09 ENCOUNTER — Encounter: Payer: Self-pay | Admitting: Internal Medicine

## 2013-07-09 VITALS — BP 118/50 | HR 68 | Temp 98.5°F | Resp 24 | Wt 208.0 lb

## 2013-07-09 DIAGNOSIS — R5381 Other malaise: Secondary | ICD-10-CM

## 2013-07-09 DIAGNOSIS — E118 Type 2 diabetes mellitus with unspecified complications: Secondary | ICD-10-CM | POA: Diagnosis not present

## 2013-07-09 DIAGNOSIS — N390 Urinary tract infection, site not specified: Secondary | ICD-10-CM | POA: Diagnosis not present

## 2013-07-09 LAB — CBC WITH DIFFERENTIAL/PLATELET
Basophils Relative: 0.4 % (ref 0.0–3.0)
Eosinophils Absolute: 0.3 10*3/uL (ref 0.0–0.7)
Eosinophils Relative: 3.3 % (ref 0.0–5.0)
Lymphocytes Relative: 20.6 % (ref 12.0–46.0)
Lymphs Abs: 1.7 10*3/uL (ref 0.7–4.0)
MCV: 102.6 fl — ABNORMAL HIGH (ref 78.0–100.0)
Monocytes Relative: 5.7 % (ref 3.0–12.0)
Neutrophils Relative %: 70 % (ref 43.0–77.0)
RBC: 3.54 Mil/uL — ABNORMAL LOW (ref 3.87–5.11)
WBC: 8.2 10*3/uL (ref 4.5–10.5)

## 2013-07-09 LAB — POCT URINALYSIS DIPSTICK
Bilirubin, UA: NEGATIVE
Glucose, UA: 100
Leukocytes, UA: NEGATIVE
Nitrite, UA: NEGATIVE
Protein, UA: NEGATIVE
Spec Grav, UA: 1.025
pH, UA: 5.5

## 2013-07-09 LAB — HEMOGLOBIN A1C: Hgb A1c MFr Bld: 8 % — ABNORMAL HIGH (ref 4.6–6.5)

## 2013-07-09 LAB — TSH: TSH: 1.63 u[IU]/mL (ref 0.35–5.50)

## 2013-07-09 LAB — VITAMIN B12: Vitamin B-12: 894 pg/mL (ref 211–911)

## 2013-07-09 NOTE — Assessment & Plan Note (Signed)
Will check A1c with labs today. Pt is followed at [facility] endocrinology. Continue current medications.

## 2013-07-09 NOTE — Assessment & Plan Note (Signed)
Recent UTI, treated with Cipro. Persistent symptoms of generalized malaise, but no urinary symptoms. Urinalysis negative today. Will repeat urine culture.

## 2013-07-09 NOTE — Progress Notes (Signed)
Subjective:    Patient ID: Brenda [name], female    DOB: 1931/08/31, 79 y.o.   MRN: [id number]  HPI  79YO female with DM, HTN presents for follow up after recent UTI. Seen at urgent care 1 week ago Thursday with fever 103F and shaking chills. Diagnosed with UTI, received shot of antibiotics and then Cipro x7 days. She reports persistent symptoms this week of generalized fatigue. No recurrent fever, chills, dysuria, hematuria, flank pain. She completed the entire course of Cipro. She has tried to increase her fluid intake by drinking water. No other new symptoms, specifically no chest pain, dyspnea, change in bowel habits. She has not been checking her BG. She has been compliant with medications.  Outpatient Encounter Prescriptions as of 07/09/2013  Medication Sig Dispense Refill  . ferrous sulfate 324 (65 FE) MG TBEC Take 1 tablet by mouth 3 (three) times daily.      . fesoterodine (TOVIAZ) 8 MG TB24 Take 8 mg by mouth daily.      Marland Kitchen gabapentin (NEURONTIN) 100 MG capsule Take 1 capsule (100 mg total) by mouth at bedtime.  30 capsule  3  . glipiZIDE (GLUCOTROL XL) 10 MG 24 hr tablet TAKE ONE TABLET BY MOUTH TWICE DAILY  180 tablet  1  . lisinopril (PRINIVIL,ZESTRIL) 20 MG tablet Take 1 tablet (20 mg total) by mouth daily.  90 tablet  3  . metFORMIN (GLUCOPHAGE) 1000 MG tablet Take 1 tablet (1,000 mg total) by mouth 2 (two) times daily with a meal. Take one  180 tablet  4  . mirtazapine (REMERON) 15 MG tablet Take 1 tablet (15 mg total) by mouth at bedtime.  30 tablet  3  . pantoprazole (PROTONIX) 40 MG tablet Take 40 mg by mouth 2 (two) times daily.      . sucralfate (CARAFATE) 1 G tablet Take 1 g by mouth 2 (two) times daily.       No facility-administered encounter medications on file as of 07/09/2013.   BP 118/50  Pulse 68  Temp(Src) 98.5 F (36.9 C) (Oral)  Resp 24  Wt 208 lb (94.348 kg)  BMI 34.61 kg/m2  SpO2 98%   Review of Systems  Constitutional: Positive for fever, chills,  appetite change and fatigue. Negative for unexpected weight change.  HENT: Negative for congestion, ear pain, sinus pressure, sore throat, trouble swallowing and voice change.   Eyes: Negative for visual disturbance.  Respiratory: Negative for cough, shortness of breath, wheezing and stridor.   Cardiovascular: Negative for chest pain, palpitations and leg swelling.  Gastrointestinal: Positive for diarrhea (one episode of watery diarrhea last week). Negative for nausea, vomiting, abdominal pain, constipation, blood in stool, abdominal distention and anal bleeding.  Genitourinary: Positive for decreased urine volume. Negative for dysuria, urgency, hematuria, flank pain and pelvic pain.  Musculoskeletal: Negative for arthralgias, gait problem, myalgias and neck pain.  Skin: Negative for color change and rash.  Neurological: Negative for dizziness and headaches.  Hematological: Negative for adenopathy. Does not bruise/bleed easily.  Psychiatric/Behavioral: Negative for suicidal ideas, sleep disturbance and dysphoric mood. The patient is not nervous/anxious.        Objective:   Physical Exam  Constitutional: She is oriented to person, place, and time. She appears well-developed and well-nourished. No distress.  HENT:  Head: Normocephalic and atraumatic.  Right Ear: External ear normal.  Left Ear: External ear normal.  Nose: Nose normal.  Mouth/Throat: Oropharynx is clear and moist. No oropharyngeal exudate.  Eyes: Conjunctivae are normal. Pupils  are equal, round, and reactive to light. Right eye exhibits no discharge. Left eye exhibits no discharge. No scleral icterus.  Neck: Normal range of motion. Neck supple. No tracheal deviation present. No thyromegaly present.  Cardiovascular: Normal rate, regular rhythm, normal heart sounds and intact distal pulses.  Exam reveals no gallop and no friction rub.   No murmur heard. Pulmonary/Chest: Effort normal and breath sounds normal. No accessory  muscle usage. Not tachypneic. No respiratory distress. She has no decreased breath sounds. She has no wheezes. She has no rhonchi. She has no rales. She exhibits no tenderness.  Abdominal: Soft. Bowel sounds are normal. She exhibits no distension and no mass. There is no tenderness. There is no rebound and no guarding.  Musculoskeletal: Normal range of motion. She exhibits no edema and no tenderness.  Lymphadenopathy:    She has no cervical adenopathy.  Neurological: She is alert and oriented to person, place, and time. No cranial nerve deficit. She exhibits normal muscle tone. Coordination normal.  Skin: Skin is warm and dry. No rash noted. She is not diaphoretic. No erythema. No pallor.  Psychiatric: She has a normal mood and affect. Her behavior is normal. Judgment and thought content normal.          Assessment & Plan:

## 2013-07-09 NOTE — Assessment & Plan Note (Signed)
Status post treatment for urinary tract infection at urgent care. Having persistent symptoms of generalized fatigue and malaise. Most consistent with dehydration. Encouraged increased fluid intake, specifically with fluids such as gatorade G2. Will check CMP, CBC, B12, TSH with labs today. Repeat urine culture pending.

## 2013-07-10 ENCOUNTER — Telehealth: Payer: Self-pay | Admitting: Internal Medicine

## 2013-07-10 NOTE — Telephone Encounter (Signed)
Patient was given blood results, please refer to lab encounter for further information.

## 2013-07-10 NOTE — Telephone Encounter (Signed)
Pt asking for results of blood work from 10/30.

## 2013-07-11 LAB — URINE CULTURE

## 2013-07-21 ENCOUNTER — Telehealth: Payer: Self-pay | Admitting: Internal Medicine

## 2013-07-21 NOTE — Telephone Encounter (Signed)
What labs does she need? 

## 2013-07-21 NOTE — Telephone Encounter (Signed)
If she is feeling better, then she does not need to be seen.

## 2013-07-21 NOTE — Telephone Encounter (Signed)
Patient informed and appointment for labs cancelled since she had them done at her last visit.

## 2013-07-21 NOTE — Telephone Encounter (Signed)
I don't see that she needs any labs. She just had labs drawn.

## 2013-07-21 NOTE — Telephone Encounter (Signed)
Spoke with patient she is not sure if she needs to come for a visit. She was seen on 10/30 for UTI and had been treated, was suppose to return to clinic for a 1 week follow up around 11/6. Does she need to come in to be seen or not?

## 2013-07-21 NOTE — Telephone Encounter (Signed)
The patient is coming in on Friday to have labs drawn . I don't see any in the system. The patient is wanting to know if should fast or not.

## 2013-07-24 ENCOUNTER — Other Ambulatory Visit: Payer: Medicare Other

## 2013-07-29 ENCOUNTER — Ambulatory Visit: Payer: Medicare Other | Admitting: Internal Medicine

## 2013-07-31 ENCOUNTER — Ambulatory Visit (INDEPENDENT_AMBULATORY_CARE_PROVIDER_SITE_OTHER): Payer: Medicare Other | Admitting: Internal Medicine

## 2013-07-31 ENCOUNTER — Encounter: Payer: Self-pay | Admitting: Internal Medicine

## 2013-07-31 VITALS — BP 126/70 | HR 75 | Temp 98.1°F | Resp 12 | Ht 65.0 in | Wt 207.0 lb

## 2013-07-31 DIAGNOSIS — M1990 Unspecified osteoarthritis, unspecified site: Secondary | ICD-10-CM

## 2013-07-31 DIAGNOSIS — E118 Type 2 diabetes mellitus with unspecified complications: Secondary | ICD-10-CM

## 2013-07-31 NOTE — Assessment & Plan Note (Signed)
Reviewed recent A1c which continues to be elevated above goal <8%. Encouraged better compliance with healthy diet and regular physical activity as tolerated. Will continue current medications. Pt has follow up scheduled with her endocrinologist.

## 2013-07-31 NOTE — Progress Notes (Signed)
Subjective:    Patient ID: Brenda [name], female    DOB: [birth year], 79 y.o.   MRN: [id number]  HPI 79 year old female with history of diabetes, obesity, GERD, osteoarthritis presents for followup. She reports that she is feeling much better than at her previous appointment. Symptoms of urinary tract infection have completely resolved. She has not been checking her blood sugars. She does report compliance with her medication including glipizide and metformin. She stopped taking her Lisinopril because she did not feel well on this medication.  She is concerned today about stiffness in her joints, most prominently in her knees. Pain and stiffness are made worse by physical activity and colder weather. She typically takes Tylenol for this on occasion with minimal improvement. She questions whether she might benefit from rheumatology evaluation.  She questions if alternative medications might be helpful to control stiffness and pain.  Outpatient Encounter Prescriptions as of 07/31/2013  Medication Sig  . ferrous sulfate 324 (65 FE) MG TBEC Take 1 tablet by mouth 3 (three) times daily.  . fesoterodine (TOVIAZ) 8 MG TB24 Take 8 mg by mouth daily.  Marland Kitchen glipiZIDE (GLUCOTROL XL) 10 MG 24 hr tablet TAKE ONE TABLET BY MOUTH TWICE DAILY  . metFORMIN (GLUCOPHAGE) 1000 MG tablet Take 1 tablet (1,000 mg total) by mouth 2 (two) times daily with a meal. Take one  . pantoprazole (PROTONIX) 40 MG tablet Take 40 mg by mouth 2 (two) times daily.  . sucralfate (CARAFATE) 1 G tablet Take 1 g by mouth 2 (two) times daily.   BP 126/70  Pulse 75  Temp(Src) 98.1 F (36.7 C) (Oral)  Resp 12  Ht 5\' 5"  (1.651 m)  Wt 207 lb (93.895 kg)  BMI 34.45 kg/m2  SpO2 95%  Review of Systems  Constitutional: Negative for fever, chills, appetite change, fatigue and unexpected weight change.  HENT: Negative for congestion, ear pain, sinus pressure, sore throat, trouble swallowing and voice change.   Eyes: Negative for visual  disturbance.  Respiratory: Negative for cough, shortness of breath, wheezing and stridor.   Cardiovascular: Negative for chest pain, palpitations and leg swelling.  Gastrointestinal: Negative for nausea, vomiting, abdominal pain, diarrhea, constipation, blood in stool, abdominal distention and anal bleeding.  Genitourinary: Negative for dysuria and flank pain.  Musculoskeletal: Positive for arthralgias and back pain. Negative for gait problem, myalgias and neck pain.  Skin: Negative for color change and rash.  Neurological: Negative for dizziness and headaches.  Hematological: Negative for adenopathy. Does not bruise/bleed easily.  Psychiatric/Behavioral: Negative for suicidal ideas, sleep disturbance and dysphoric mood. The patient is not nervous/anxious.        Objective:   Physical Exam  Constitutional: She is oriented to person, place, and time. She appears well-developed and well-nourished. No distress.  HENT:  Head: Normocephalic and atraumatic.  Right Ear: External ear normal.  Left Ear: External ear normal.  Nose: Nose normal.  Mouth/Throat: Oropharynx is clear and moist. No oropharyngeal exudate.  Eyes: Conjunctivae are normal. Pupils are equal, round, and reactive to light. Right eye exhibits no discharge. Left eye exhibits no discharge. No scleral icterus.  Neck: Normal range of motion. Neck supple. No tracheal deviation present. No thyromegaly present.  Cardiovascular: Normal rate, regular rhythm, normal heart sounds and intact distal pulses.  Exam reveals no gallop and no friction rub.   No murmur heard. Pulmonary/Chest: Effort normal and breath sounds normal. No accessory muscle usage. Not tachypneic. No respiratory distress. She has no decreased breath sounds. She has no  wheezes. She has no rhonchi. She has no rales. She exhibits no tenderness.  Musculoskeletal: She exhibits no edema and no tenderness.       Right knee: She exhibits decreased range of motion. She exhibits  no swelling.       Left knee: She exhibits decreased range of motion. She exhibits no swelling.  Crepitus bilateral knees   Lymphadenopathy:    She has no cervical adenopathy.  Neurological: She is alert and oriented to person, place, and time. No cranial nerve deficit. She exhibits normal muscle tone. Coordination normal.  Skin: Skin is warm and dry. No rash noted. She is not diaphoretic. No erythema. No pallor.  Psychiatric: She has a normal mood and affect. Her behavior is normal. Judgment and thought content normal.          Assessment & Plan:

## 2013-07-31 NOTE — Progress Notes (Signed)
Pre visit review using our clinic review tool, if applicable. No additional management support is needed unless otherwise documented below in the visit note. 

## 2013-07-31 NOTE — Assessment & Plan Note (Signed)
Symptoms and exam are most consistent with OA. Discussed use of tylenol prn pain. Will avoid NSAIDS given h/o GERD. Will set up rheumatology evaluation. Question if steroid or Synvisc injections bilateral knees might be helpful for pain control.

## 2013-09-15 ENCOUNTER — Ambulatory Visit: Payer: Self-pay | Admitting: Physician Assistant

## 2013-09-15 DIAGNOSIS — N809 Endometriosis, unspecified: Secondary | ICD-10-CM | POA: Diagnosis not present

## 2013-09-15 DIAGNOSIS — Z79899 Other long term (current) drug therapy: Secondary | ICD-10-CM | POA: Diagnosis not present

## 2013-09-15 DIAGNOSIS — E119 Type 2 diabetes mellitus without complications: Secondary | ICD-10-CM | POA: Diagnosis not present

## 2013-09-15 DIAGNOSIS — N390 Urinary tract infection, site not specified: Secondary | ICD-10-CM | POA: Diagnosis not present

## 2013-09-15 DIAGNOSIS — Z90710 Acquired absence of both cervix and uterus: Secondary | ICD-10-CM | POA: Diagnosis not present

## 2013-09-15 DIAGNOSIS — I10 Essential (primary) hypertension: Secondary | ICD-10-CM | POA: Diagnosis not present

## 2013-09-15 LAB — URINALYSIS, COMPLETE
BLOOD: NEGATIVE
Bilirubin,UR: NEGATIVE
Glucose,UR: NEGATIVE mg/dL (ref 0–75)
KETONE: NEGATIVE
NITRITE: POSITIVE
PROTEIN: NEGATIVE
Ph: 5 (ref 4.5–8.0)
Specific Gravity: 1.025 (ref 1.003–1.030)

## 2013-09-19 LAB — URINE CULTURE

## 2013-09-25 ENCOUNTER — Ambulatory Visit: Payer: Self-pay | Admitting: Family Medicine

## 2013-09-25 DIAGNOSIS — R0602 Shortness of breath: Secondary | ICD-10-CM | POA: Diagnosis not present

## 2013-09-25 DIAGNOSIS — E7800 Pure hypercholesterolemia, unspecified: Secondary | ICD-10-CM | POA: Diagnosis not present

## 2013-09-25 DIAGNOSIS — R5383 Other fatigue: Secondary | ICD-10-CM | POA: Diagnosis not present

## 2013-09-25 DIAGNOSIS — D509 Iron deficiency anemia, unspecified: Secondary | ICD-10-CM | POA: Diagnosis not present

## 2013-09-25 DIAGNOSIS — K449 Diaphragmatic hernia without obstruction or gangrene: Secondary | ICD-10-CM | POA: Diagnosis not present

## 2013-09-25 DIAGNOSIS — R5381 Other malaise: Secondary | ICD-10-CM | POA: Diagnosis not present

## 2013-09-25 DIAGNOSIS — N3941 Urge incontinence: Secondary | ICD-10-CM | POA: Diagnosis not present

## 2013-09-30 ENCOUNTER — Ambulatory Visit: Payer: Self-pay | Admitting: Family Medicine

## 2013-09-30 DIAGNOSIS — R5381 Other malaise: Secondary | ICD-10-CM | POA: Diagnosis not present

## 2013-09-30 DIAGNOSIS — Z8544 Personal history of malignant neoplasm of other female genital organs: Secondary | ICD-10-CM | POA: Diagnosis not present

## 2013-09-30 DIAGNOSIS — Z90710 Acquired absence of both cervix and uterus: Secondary | ICD-10-CM | POA: Diagnosis not present

## 2013-09-30 DIAGNOSIS — K869 Disease of pancreas, unspecified: Secondary | ICD-10-CM | POA: Diagnosis not present

## 2013-09-30 DIAGNOSIS — R5383 Other fatigue: Secondary | ICD-10-CM | POA: Diagnosis not present

## 2013-09-30 DIAGNOSIS — C55 Malignant neoplasm of uterus, part unspecified: Secondary | ICD-10-CM | POA: Diagnosis not present

## 2013-10-19 ENCOUNTER — Ambulatory Visit (INDEPENDENT_AMBULATORY_CARE_PROVIDER_SITE_OTHER): Payer: Medicare Other | Admitting: *Deleted

## 2013-10-19 DIAGNOSIS — D518 Other vitamin B12 deficiency anemias: Secondary | ICD-10-CM

## 2013-10-19 DIAGNOSIS — M159 Polyosteoarthritis, unspecified: Secondary | ICD-10-CM | POA: Diagnosis not present

## 2013-10-19 DIAGNOSIS — D519 Vitamin B12 deficiency anemia, unspecified: Secondary | ICD-10-CM

## 2013-10-19 DIAGNOSIS — M48061 Spinal stenosis, lumbar region without neurogenic claudication: Secondary | ICD-10-CM | POA: Diagnosis not present

## 2013-10-19 DIAGNOSIS — M25569 Pain in unspecified knee: Secondary | ICD-10-CM | POA: Diagnosis not present

## 2013-10-19 MED ORDER — CYANOCOBALAMIN 1000 MCG/ML IJ SOLN
1000.0000 ug | Freq: Once | INTRAMUSCULAR | Status: AC
  Administered 2013-10-19: 1000 ug via INTRAMUSCULAR

## 2013-10-26 ENCOUNTER — Ambulatory Visit: Payer: Self-pay | Admitting: Rheumatology

## 2013-10-26 DIAGNOSIS — M549 Dorsalgia, unspecified: Secondary | ICD-10-CM | POA: Diagnosis not present

## 2013-10-26 DIAGNOSIS — M48061 Spinal stenosis, lumbar region without neurogenic claudication: Secondary | ICD-10-CM | POA: Diagnosis not present

## 2013-10-26 DIAGNOSIS — M5126 Other intervertebral disc displacement, lumbar region: Secondary | ICD-10-CM | POA: Diagnosis not present

## 2013-10-26 DIAGNOSIS — M5137 Other intervertebral disc degeneration, lumbosacral region: Secondary | ICD-10-CM | POA: Diagnosis not present

## 2013-10-26 DIAGNOSIS — M47817 Spondylosis without myelopathy or radiculopathy, lumbosacral region: Secondary | ICD-10-CM | POA: Diagnosis not present

## 2013-10-26 DIAGNOSIS — M79609 Pain in unspecified limb: Secondary | ICD-10-CM | POA: Diagnosis not present

## 2013-11-10 ENCOUNTER — Encounter: Payer: Self-pay | Admitting: Rheumatology

## 2013-11-10 DIAGNOSIS — M48062 Spinal stenosis, lumbar region with neurogenic claudication: Secondary | ICD-10-CM | POA: Diagnosis not present

## 2013-11-10 DIAGNOSIS — M2560 Stiffness of unspecified joint, not elsewhere classified: Secondary | ICD-10-CM | POA: Diagnosis not present

## 2013-11-10 DIAGNOSIS — M6281 Muscle weakness (generalized): Secondary | ICD-10-CM | POA: Diagnosis not present

## 2013-11-10 DIAGNOSIS — IMO0001 Reserved for inherently not codable concepts without codable children: Secondary | ICD-10-CM | POA: Diagnosis not present

## 2013-11-10 DIAGNOSIS — M545 Low back pain, unspecified: Secondary | ICD-10-CM | POA: Diagnosis not present

## 2013-11-23 DIAGNOSIS — R3589 Other polyuria: Secondary | ICD-10-CM | POA: Diagnosis not present

## 2013-11-23 DIAGNOSIS — N318 Other neuromuscular dysfunction of bladder: Secondary | ICD-10-CM | POA: Diagnosis not present

## 2013-11-23 DIAGNOSIS — E119 Type 2 diabetes mellitus without complications: Secondary | ICD-10-CM | POA: Diagnosis not present

## 2013-11-23 DIAGNOSIS — R358 Other polyuria: Secondary | ICD-10-CM | POA: Diagnosis not present

## 2013-12-08 ENCOUNTER — Encounter: Payer: Self-pay | Admitting: Internal Medicine

## 2013-12-10 DIAGNOSIS — IMO0001 Reserved for inherently not codable concepts without codable children: Secondary | ICD-10-CM | POA: Diagnosis not present

## 2013-12-10 DIAGNOSIS — I10 Essential (primary) hypertension: Secondary | ICD-10-CM | POA: Diagnosis not present

## 2013-12-10 DIAGNOSIS — E538 Deficiency of other specified B group vitamins: Secondary | ICD-10-CM | POA: Diagnosis not present

## 2013-12-11 ENCOUNTER — Encounter: Payer: Medicare Other | Admitting: Internal Medicine

## 2013-12-15 ENCOUNTER — Ambulatory Visit: Payer: Self-pay | Admitting: Emergency Medicine

## 2013-12-15 DIAGNOSIS — M4800 Spinal stenosis, site unspecified: Secondary | ICD-10-CM | POA: Diagnosis not present

## 2013-12-15 DIAGNOSIS — N809 Endometriosis, unspecified: Secondary | ICD-10-CM | POA: Diagnosis not present

## 2013-12-15 DIAGNOSIS — Z90710 Acquired absence of both cervix and uterus: Secondary | ICD-10-CM | POA: Diagnosis not present

## 2013-12-15 DIAGNOSIS — I10 Essential (primary) hypertension: Secondary | ICD-10-CM | POA: Diagnosis not present

## 2013-12-15 DIAGNOSIS — E119 Type 2 diabetes mellitus without complications: Secondary | ICD-10-CM | POA: Diagnosis not present

## 2013-12-15 DIAGNOSIS — Z79899 Other long term (current) drug therapy: Secondary | ICD-10-CM | POA: Diagnosis not present

## 2013-12-15 DIAGNOSIS — E860 Dehydration: Secondary | ICD-10-CM | POA: Diagnosis not present

## 2013-12-15 LAB — URINALYSIS, COMPLETE
BILIRUBIN, UR: NEGATIVE
BLOOD: NEGATIVE
Glucose,UR: NEGATIVE mg/dL (ref 0–75)
KETONE: NEGATIVE
Leukocyte Esterase: NEGATIVE
NITRITE: NEGATIVE
Ph: 6 (ref 4.5–8.0)
Protein: NEGATIVE
SPECIFIC GRAVITY: 1.015 (ref 1.003–1.030)
SQUAMOUS EPITHELIAL: NONE SEEN

## 2013-12-15 LAB — CBC WITH DIFFERENTIAL/PLATELET
Basophil #: 0 10*3/uL (ref 0.0–0.1)
Basophil %: 0.5 %
Eosinophil #: 0.2 10*3/uL (ref 0.0–0.7)
Eosinophil %: 1.8 %
HCT: 37 % (ref 35.0–47.0)
HGB: 12.3 g/dL (ref 12.0–16.0)
Lymphocyte #: 2 10*3/uL (ref 1.0–3.6)
Lymphocyte %: 23.4 %
MCH: 34.6 pg — ABNORMAL HIGH (ref 26.0–34.0)
MCHC: 33.2 g/dL (ref 32.0–36.0)
MCV: 104 fL — ABNORMAL HIGH (ref 80–100)
Monocyte #: 0.5 x10 3/mm (ref 0.2–0.9)
Monocyte %: 6.1 %
Neutrophil #: 5.8 10*3/uL (ref 1.4–6.5)
Neutrophil %: 68.2 %
Platelet: 287 10*3/uL (ref 150–440)
RBC: 3.56 10*6/uL — ABNORMAL LOW (ref 3.80–5.20)
RDW: 13 % (ref 11.5–14.5)
WBC: 8.5 10*3/uL (ref 3.6–11.0)

## 2013-12-15 LAB — COMPREHENSIVE METABOLIC PANEL
Albumin: 3.7 g/dL (ref 3.4–5.0)
Alkaline Phosphatase: 84 U/L
Anion Gap: 9 (ref 7–16)
BUN: 26 mg/dL — ABNORMAL HIGH (ref 7–18)
Bilirubin,Total: 0.3 mg/dL (ref 0.2–1.0)
Calcium, Total: 9.7 mg/dL (ref 8.5–10.1)
Chloride: 100 mmol/L (ref 98–107)
Co2: 28 mmol/L (ref 21–32)
Creatinine: 0.99 mg/dL (ref 0.60–1.30)
EGFR (African American): >60
EGFR (Non-African Amer.): 54 — ABNORMAL LOW
Glucose: 103 mg/dL — ABNORMAL HIGH (ref 65–99)
Osmolality: 279 (ref 275–301)
Potassium: 5 mmol/L (ref 3.5–5.1)
SGOT(AST): 17 U/L (ref 15–37)
SGPT (ALT): 22 U/L (ref 12–78)
Sodium: 137 mmol/L (ref 136–145)
Total Protein: 7.2 g/dL (ref 6.4–8.2)

## 2013-12-17 ENCOUNTER — Encounter: Payer: Medicare Other | Admitting: Internal Medicine

## 2013-12-25 DIAGNOSIS — R1013 Epigastric pain: Secondary | ICD-10-CM | POA: Diagnosis not present

## 2013-12-31 DIAGNOSIS — D649 Anemia, unspecified: Secondary | ICD-10-CM | POA: Diagnosis not present

## 2013-12-31 DIAGNOSIS — K921 Melena: Secondary | ICD-10-CM | POA: Diagnosis not present

## 2014-01-01 ENCOUNTER — Encounter: Payer: Self-pay | Admitting: Internal Medicine

## 2014-01-01 ENCOUNTER — Ambulatory Visit (INDEPENDENT_AMBULATORY_CARE_PROVIDER_SITE_OTHER): Payer: Medicare Other | Admitting: Internal Medicine

## 2014-01-01 VITALS — BP 124/76 | HR 64 | Temp 98.7°F | Ht 62.75 in | Wt 212.0 lb

## 2014-01-01 DIAGNOSIS — I10 Essential (primary) hypertension: Secondary | ICD-10-CM

## 2014-01-01 DIAGNOSIS — Z0000 Encounter for general adult medical examination without abnormal findings: Secondary | ICD-10-CM

## 2014-01-01 DIAGNOSIS — D649 Anemia, unspecified: Secondary | ICD-10-CM | POA: Diagnosis not present

## 2014-01-01 DIAGNOSIS — Z1239 Encounter for other screening for malignant neoplasm of breast: Secondary | ICD-10-CM

## 2014-01-01 DIAGNOSIS — N390 Urinary tract infection, site not specified: Secondary | ICD-10-CM

## 2014-01-01 DIAGNOSIS — E118 Type 2 diabetes mellitus with unspecified complications: Secondary | ICD-10-CM | POA: Diagnosis not present

## 2014-01-01 DIAGNOSIS — Z23 Encounter for immunization: Secondary | ICD-10-CM

## 2014-01-01 LAB — POCT URINALYSIS DIPSTICK
Bilirubin, UA: NEGATIVE
Blood, UA: NEGATIVE
Glucose, UA: NEGATIVE
Ketones, UA: NEGATIVE
Nitrite, UA: NEGATIVE
PH UA: 5
PROTEIN UA: NEGATIVE
Spec Grav, UA: 1.025
Urobilinogen, UA: 0.2

## 2014-01-01 NOTE — Assessment & Plan Note (Signed)
General medical exam including breast exam normal today. Mammogram ordered. Will request notes on recent GI evaluation. Prevnar given today. Recent labs from Urgent Care reviewed. Will check electrolytes, renal and liver function today. Encouraged healthy diet and exercise as tolerated.

## 2014-01-01 NOTE — Assessment & Plan Note (Signed)
BP Readings from Last 3 Encounters:  01/01/14 124/76  07/31/13 126/70  07/09/13 118/50   BP well controlled. Will continue Lisinopril. Will check renal function with labs today.

## 2014-01-01 NOTE — Assessment & Plan Note (Signed)
Will request recent labs and notes from [facility].

## 2014-01-01 NOTE — Progress Notes (Signed)
Pre visit review using our clinic review tool, if applicable. No additional management support is needed unless otherwise documented below in the visit note. 

## 2014-01-01 NOTE — Assessment & Plan Note (Addendum)
Recent A1c reportedly 7.7%. Will request notes from her endocrinologist. Continue Metformin.

## 2014-01-01 NOTE — Progress Notes (Signed)
Subjective:    Patient ID: Brenda [name], female    DOB: 1930/11/16, 80 y.o.   MRN: [id number]  HPI The patient is here for annual Medicare wellness examination and management of other chronic and acute problems.   The risk factors are reflected in the social history.  The roster of all physicians providing medical care to patient - is listed in the Snapshot section of the chart.  Activities of daily living:  The patient is 100% independent in all ADLs: dressing, toileting, feeding as well as independent mobility. Lives alone. Husband passed away 10 years ago. No pets. Home is one story home.  Home safety : The patient does not have smoke detectors in the home. They wear seatbelts.  Has a med alert to wear at home. There are no firearms at home. There is no violence in the home.   There is no risks for hepatitis, STDs or HIV. There is a history of blood transfusion when she had bleeding ulcers. They have no travel history to infectious disease endemic areas of the world.  The patient has not seen their dentist in the last six month. Limited because of cost. (Dr. Claris Pong) They have seen their eye doctor in the last year. [facility]) She has hearing aids but does not generally wear them.  They have deferred audiologic testing in the last year.   They do not  have excessive sun exposure. Discussed the need for sun protection: hats, long sleeves and use of sunscreen if there is significant sun exposure. (Derm- Dr. Koleen Nimrod) Endocrine - Dr. Eddie Dibbles GI - Dr. Tiffany Kocher Rheumatology - Dr. Jefm Bryant  Diet: the importance of a healthy diet is discussed. They do have a healthy diet.  The benefits of regular aerobic exercise were discussed. She does not exercise.  Depression screen: there are no signs or vegative symptoms of depression- irritability, change in appetite, anhedonia, sadness/tearfullness.   Cognitive assessment: the patient manages all their financial and personal affairs and  is actively engaged. They could relate day,date,year and events.  HCPOA - Daughter, Brenda [name]  The following portions of the patient's history were reviewed and updated as appropriate: allergies, current medications, past family history, past medical history,  past surgical history, past social history  and problem list.  Visual acuity was not assessed per patient preference since she has regular follow up with her ophthalmologist. Hearing and body mass index were assessed and reviewed.   During the course of the visit the patient was educated and counseled about appropriate screening and preventive services including : fall prevention , diabetes screening, nutrition counseling, colorectal cancer screening, and recommended immunizations.    Patient is concerned today about shortness of breath with minimal exertion. She reports that after raising from a seated position and walking to her kitchen she becomes very short of breath. This resolves after several minutes of rest. She occasionally has palpitations but denies chest pain. She is sometimes diaphoretic. She also notes chronic fatigue.   She is also concerned today about persistent rash between her buttocks. She has been treating this with topical nystatin cream with no improvement. Area is described as painful and itchy. It is been present for several months.  Seen by Dr. Tiffany Kocher yesterday, had labs to evaluate anemia. A few weeks ago, had dark black tarry stools which Dr. Tiffany Kocher was following. No recent decision to repeat colonoscopy, but follow up pending. No recent abdominal pain or any recurrent black stools.  Recently evaluated by Dr.  Kernodle for back pain. Had MRI which was reportedly unchanged from prior Completed physical therapy with no improvement. Not currently taking anything for pain.  DM reports recent A1c 7.7%. Compliant with meds.  Review of Systems  Constitutional: Negative for fever, chills, appetite change, fatigue and  unexpected weight change.  HENT: Negative for congestion, ear pain, sinus pressure, sore throat, trouble swallowing and voice change.   Eyes: Negative for visual disturbance.  Respiratory: Negative for cough, shortness of breath, wheezing and stridor.   Cardiovascular: Negative for chest pain, palpitations and leg swelling.  Gastrointestinal: Negative for nausea, vomiting, abdominal pain, diarrhea, constipation, blood in stool, abdominal distention and anal bleeding.  Genitourinary: Negative for dysuria and flank pain.  Musculoskeletal: Positive for arthralgias and back pain. Negative for gait problem, myalgias and neck pain.  Skin: Negative for color change and rash.  Neurological: Negative for dizziness and headaches.  Hematological: Negative for adenopathy. Does not bruise/bleed easily.  Psychiatric/Behavioral: Negative for suicidal ideas, sleep disturbance and dysphoric mood. The patient is not nervous/anxious.    BP 124/76  Pulse 64  Temp(Src) 98.7 F (37.1 C) (Oral)  Ht 5' 2.75" (1.594 m)  Wt 212 lb (96.163 kg)  BMI 37.85 kg/m2  SpO2 96%      Objective:   Physical Exam  Constitutional: She is oriented to person, place, and time. She appears well-developed and well-nourished. No distress.  HENT:  Head: Normocephalic and atraumatic.  Right Ear: External ear normal.  Left Ear: External ear normal.  Nose: Nose normal.  Mouth/Throat: Oropharynx is clear and moist. No oropharyngeal exudate.  Eyes: Conjunctivae are normal. Pupils are equal, round, and reactive to light. Right eye exhibits no discharge. Left eye exhibits no discharge. No scleral icterus.  Neck: Normal range of motion. Neck supple. No tracheal deviation present. No thyromegaly present.  Cardiovascular: Normal rate, regular rhythm, normal heart sounds and intact distal pulses.  Exam reveals no gallop and no friction rub.   No murmur heard. Pulmonary/Chest: Effort normal and breath sounds normal. No accessory muscle  usage. Not tachypneic. No respiratory distress. She has no decreased breath sounds. She has no wheezes. She has no rales. She exhibits no tenderness. Right breast exhibits no inverted nipple, no mass, no nipple discharge, no skin change and no tenderness. Left breast exhibits no inverted nipple, no mass, no nipple discharge, no skin change and no tenderness. Breasts are symmetrical.  Abdominal: Soft. Bowel sounds are normal. She exhibits no distension and no mass. There is no tenderness. There is no rebound and no guarding.  Musculoskeletal: Normal range of motion. She exhibits no edema and no tenderness.  Lymphadenopathy:    She has no cervical adenopathy.  Neurological: She is alert and oriented to person, place, and time. No cranial nerve deficit. She exhibits normal muscle tone. Coordination normal.  Skin: Skin is warm and dry. No rash noted. She is not diaphoretic. No erythema. No pallor.  Psychiatric: She has a normal mood and affect. Her behavior is normal. Judgment and thought content normal.          Assessment & Plan:

## 2014-01-04 ENCOUNTER — Telehealth: Payer: Self-pay | Admitting: Internal Medicine

## 2014-01-04 ENCOUNTER — Other Ambulatory Visit: Payer: Self-pay | Admitting: *Deleted

## 2014-01-04 LAB — URINE CULTURE: Colony Count: >=100000

## 2014-01-04 MED ORDER — CIPROFLOXACIN HCL 500 MG PO TABS: 500.0000 mg | ORAL_TABLET | Freq: Two times a day (BID) | ORAL | Status: DC

## 2014-01-04 NOTE — Addendum Note (Signed)
Addended by: Ronette Deter A on: 01/04/2014 12:14 PM   Modules accepted: Orders

## 2014-01-04 NOTE — Telephone Encounter (Signed)
Relevant patient education assigned to patient using Emmi. ° °

## 2014-01-06 ENCOUNTER — Telehealth: Payer: Self-pay | Admitting: *Deleted

## 2014-01-06 NOTE — Telephone Encounter (Signed)
Pt wants to know if she can take the cipro while she is waiting for her appt, she states that it does bring her some relief.

## 2014-01-06 NOTE — Telephone Encounter (Signed)
error 

## 2014-01-06 NOTE — Telephone Encounter (Signed)
Yes, I initially called in cipro, when preliminary results came in indicating infection. However, when final results came in, it showed the infection was resistant to all oral antibiotics. I asked for Lorriane Shire to set up an evaluation with ID asap. Larene Beach- Can you make sure referral to Dr. Ola Spurr or another ID provider completed asap? She likely needs IV antibiotics.

## 2014-01-06 NOTE — Telephone Encounter (Signed)
Pt was seen Friday, was called Monday stating cipro was called into pharmacy, then was called the same day being told not to take the cipro that we would make an appt for her to be seen with an infectious disease doctor. She has not heard anything else and is very upset.

## 2014-01-06 NOTE — Telephone Encounter (Signed)
OK. She can continue the Cipro until we get her in with ID.

## 2014-01-07 NOTE — Telephone Encounter (Signed)
Line busy, unable to leave message. Will call back

## 2014-01-07 NOTE — Telephone Encounter (Signed)
Left message, notifying pt.  

## 2014-01-20 DIAGNOSIS — B353 Tinea pedis: Secondary | ICD-10-CM | POA: Diagnosis not present

## 2014-01-20 DIAGNOSIS — L280 Lichen simplex chronicus: Secondary | ICD-10-CM | POA: Diagnosis not present

## 2014-01-20 DIAGNOSIS — B354 Tinea corporis: Secondary | ICD-10-CM | POA: Diagnosis not present

## 2014-01-21 ENCOUNTER — Encounter: Payer: Self-pay | Admitting: *Deleted

## 2014-01-29 DIAGNOSIS — R32 Unspecified urinary incontinence: Secondary | ICD-10-CM | POA: Diagnosis not present

## 2014-01-29 DIAGNOSIS — E119 Type 2 diabetes mellitus without complications: Secondary | ICD-10-CM | POA: Diagnosis not present

## 2014-01-29 DIAGNOSIS — I10 Essential (primary) hypertension: Secondary | ICD-10-CM | POA: Diagnosis not present

## 2014-01-29 DIAGNOSIS — N3090 Cystitis, unspecified without hematuria: Secondary | ICD-10-CM | POA: Diagnosis not present

## 2014-02-02 ENCOUNTER — Other Ambulatory Visit: Payer: Self-pay | Admitting: Internal Medicine

## 2014-02-04 DIAGNOSIS — E538 Deficiency of other specified B group vitamins: Secondary | ICD-10-CM | POA: Diagnosis not present

## 2014-02-05 ENCOUNTER — Other Ambulatory Visit: Payer: Self-pay | Admitting: Internal Medicine

## 2014-03-09 ENCOUNTER — Ambulatory Visit (INDEPENDENT_AMBULATORY_CARE_PROVIDER_SITE_OTHER): Payer: Medicare Other | Admitting: *Deleted

## 2014-03-09 DIAGNOSIS — E538 Deficiency of other specified B group vitamins: Secondary | ICD-10-CM | POA: Diagnosis not present

## 2014-03-09 MED ORDER — CYANOCOBALAMIN 1000 MCG/ML IJ SOLN
1000.0000 ug | Freq: Once | INTRAMUSCULAR | Status: AC
  Administered 2014-03-09: 1000 ug via INTRAMUSCULAR

## 2014-03-22 ENCOUNTER — Ambulatory Visit: Payer: Self-pay

## 2014-03-22 DIAGNOSIS — R5383 Other fatigue: Secondary | ICD-10-CM | POA: Diagnosis not present

## 2014-03-22 DIAGNOSIS — E119 Type 2 diabetes mellitus without complications: Secondary | ICD-10-CM | POA: Diagnosis not present

## 2014-03-22 DIAGNOSIS — M4800 Spinal stenosis, site unspecified: Secondary | ICD-10-CM | POA: Diagnosis not present

## 2014-03-22 DIAGNOSIS — Z79899 Other long term (current) drug therapy: Secondary | ICD-10-CM | POA: Diagnosis not present

## 2014-03-22 DIAGNOSIS — I10 Essential (primary) hypertension: Secondary | ICD-10-CM | POA: Diagnosis not present

## 2014-03-22 DIAGNOSIS — R5381 Other malaise: Secondary | ICD-10-CM | POA: Diagnosis not present

## 2014-03-22 DIAGNOSIS — E538 Deficiency of other specified B group vitamins: Secondary | ICD-10-CM | POA: Diagnosis not present

## 2014-03-22 DIAGNOSIS — G4700 Insomnia, unspecified: Secondary | ICD-10-CM | POA: Diagnosis not present

## 2014-03-22 DIAGNOSIS — N809 Endometriosis, unspecified: Secondary | ICD-10-CM | POA: Diagnosis not present

## 2014-03-22 LAB — URINALYSIS, COMPLETE
Bacteria: NEGATIVE
Bilirubin,UR: NEGATIVE
Blood: NEGATIVE
Glucose,UR: 250 mg/dL (ref 0–75)
Ketone: NEGATIVE
Leukocyte Esterase: NEGATIVE
Nitrite: NEGATIVE
PROTEIN: NEGATIVE
Ph: 6 (ref 4.5–8.0)
RBC,UR: NONE SEEN /HPF (ref 0–5)
Specific Gravity: 1.01 (ref 1.003–1.030)
WBC UR: NONE SEEN /HPF (ref 0–5)

## 2014-03-22 LAB — CBC WITH DIFFERENTIAL/PLATELET
Basophil #: 0 10*3/uL (ref 0.0–0.1)
Basophil %: 0.6 %
EOS ABS: 0.2 10*3/uL (ref 0.0–0.7)
EOS PCT: 3.5 %
HCT: 36.2 % (ref 35.0–47.0)
HGB: 12.1 g/dL (ref 12.0–16.0)
LYMPHS ABS: 1.4 10*3/uL (ref 1.0–3.6)
LYMPHS PCT: 21.6 %
MCH: 34.7 pg — ABNORMAL HIGH (ref 26.0–34.0)
MCHC: 33.5 g/dL (ref 32.0–36.0)
MCV: 104 fL — ABNORMAL HIGH (ref 80–100)
MONOS PCT: 7.7 %
Monocyte #: 0.5 x10 3/mm (ref 0.2–0.9)
NEUTROS ABS: 4.5 10*3/uL (ref 1.4–6.5)
NEUTROS PCT: 66.6 %
PLATELETS: 263 10*3/uL (ref 150–440)
RBC: 3.5 10*6/uL — ABNORMAL LOW (ref 3.80–5.20)
RDW: 12.4 % (ref 11.5–14.5)
WBC: 6.7 10*3/uL (ref 3.6–11.0)

## 2014-03-22 LAB — BASIC METABOLIC PANEL
ANION GAP: 8 (ref 7–16)
BUN: 15 mg/dL (ref 7–18)
CALCIUM: 9.5 mg/dL (ref 8.5–10.1)
CO2: 27 mmol/L (ref 21–32)
CREATININE: 1.11 mg/dL (ref 0.60–1.30)
Chloride: 102 mmol/L (ref 98–107)
EGFR (African American): 54 — ABNORMAL LOW
EGFR (Non-African Amer.): 47 — ABNORMAL LOW
GLUCOSE: 230 mg/dL — AB (ref 65–99)
OSMOLALITY: 282 (ref 275–301)
POTASSIUM: 4.6 mmol/L (ref 3.5–5.1)
Sodium: 137 mmol/L (ref 136–145)

## 2014-03-24 LAB — URINE CULTURE

## 2014-04-05 ENCOUNTER — Emergency Department: Payer: Self-pay | Admitting: Emergency Medicine

## 2014-04-05 DIAGNOSIS — Z886 Allergy status to analgesic agent status: Secondary | ICD-10-CM | POA: Diagnosis not present

## 2014-04-05 DIAGNOSIS — R5381 Other malaise: Secondary | ICD-10-CM | POA: Diagnosis not present

## 2014-04-05 DIAGNOSIS — Z882 Allergy status to sulfonamides status: Secondary | ICD-10-CM | POA: Diagnosis not present

## 2014-04-05 DIAGNOSIS — R05 Cough: Secondary | ICD-10-CM | POA: Diagnosis not present

## 2014-04-05 DIAGNOSIS — E119 Type 2 diabetes mellitus without complications: Secondary | ICD-10-CM | POA: Diagnosis not present

## 2014-04-05 DIAGNOSIS — R059 Cough, unspecified: Secondary | ICD-10-CM | POA: Diagnosis not present

## 2014-04-05 DIAGNOSIS — B9789 Other viral agents as the cause of diseases classified elsewhere: Secondary | ICD-10-CM | POA: Diagnosis not present

## 2014-04-05 DIAGNOSIS — R5383 Other fatigue: Secondary | ICD-10-CM | POA: Diagnosis not present

## 2014-04-05 DIAGNOSIS — J209 Acute bronchitis, unspecified: Secondary | ICD-10-CM | POA: Diagnosis not present

## 2014-04-05 DIAGNOSIS — I10 Essential (primary) hypertension: Secondary | ICD-10-CM | POA: Diagnosis not present

## 2014-04-05 LAB — BASIC METABOLIC PANEL
Anion Gap: 10 (ref 7–16)
BUN: 11 mg/dL (ref 7–18)
CALCIUM: 9 mg/dL (ref 8.5–10.1)
Chloride: 104 mmol/L (ref 98–107)
Co2: 26 mmol/L (ref 21–32)
Creatinine: 0.89 mg/dL (ref 0.60–1.30)
EGFR (African American): >60
EGFR (Non-African Amer.): >60
GLUCOSE: 200 mg/dL — AB (ref 65–99)
Osmolality: 284 (ref 275–301)
POTASSIUM: 3.6 mmol/L (ref 3.5–5.1)
Sodium: 140 mmol/L (ref 136–145)

## 2014-04-05 LAB — CBC
HCT: 35.9 % (ref 35.0–47.0)
HGB: 12.1 g/dL (ref 12.0–16.0)
MCH: 34.5 pg — ABNORMAL HIGH (ref 26.0–34.0)
MCHC: 33.6 g/dL (ref 32.0–36.0)
MCV: 103 fL — AB (ref 80–100)
Platelet: 206 10*3/uL (ref 150–440)
RBC: 3.49 10*6/uL — ABNORMAL LOW (ref 3.80–5.20)
RDW: 12.5 % (ref 11.5–14.5)
WBC: 3.8 10*3/uL (ref 3.6–11.0)

## 2014-04-05 LAB — TROPONIN I: Troponin-I: <0.02 ng/mL

## 2014-04-13 ENCOUNTER — Ambulatory Visit: Payer: Medicare Other

## 2014-04-13 ENCOUNTER — Ambulatory Visit (INDEPENDENT_AMBULATORY_CARE_PROVIDER_SITE_OTHER): Payer: Medicare Other | Admitting: *Deleted

## 2014-04-13 DIAGNOSIS — E538 Deficiency of other specified B group vitamins: Secondary | ICD-10-CM | POA: Diagnosis not present

## 2014-04-13 MED ORDER — CYANOCOBALAMIN 1000 MCG/ML IJ SOLN
1000.0000 ug | Freq: Once | INTRAMUSCULAR | Status: AC
  Administered 2014-04-13: 1000 ug via INTRAMUSCULAR

## 2014-04-16 ENCOUNTER — Other Ambulatory Visit: Payer: Self-pay | Admitting: Internal Medicine

## 2014-05-08 ENCOUNTER — Emergency Department: Payer: Self-pay | Admitting: Internal Medicine

## 2014-05-08 DIAGNOSIS — R5383 Other fatigue: Secondary | ICD-10-CM | POA: Diagnosis not present

## 2014-05-08 DIAGNOSIS — N390 Urinary tract infection, site not specified: Secondary | ICD-10-CM | POA: Diagnosis not present

## 2014-05-08 DIAGNOSIS — I10 Essential (primary) hypertension: Secondary | ICD-10-CM | POA: Diagnosis not present

## 2014-05-08 DIAGNOSIS — IMO0001 Reserved for inherently not codable concepts without codable children: Secondary | ICD-10-CM | POA: Diagnosis not present

## 2014-05-08 DIAGNOSIS — Z79899 Other long term (current) drug therapy: Secondary | ICD-10-CM | POA: Diagnosis not present

## 2014-05-08 DIAGNOSIS — R5381 Other malaise: Secondary | ICD-10-CM | POA: Diagnosis not present

## 2014-05-08 LAB — COMPREHENSIVE METABOLIC PANEL
ALBUMIN: 3.4 g/dL (ref 3.4–5.0)
ALK PHOS: 61 U/L
AST: 16 U/L (ref 15–37)
Anion Gap: 10 (ref 7–16)
BILIRUBIN TOTAL: 0.3 mg/dL (ref 0.2–1.0)
BUN: 15 mg/dL (ref 7–18)
CALCIUM: 9.2 mg/dL (ref 8.5–10.1)
CHLORIDE: 105 mmol/L (ref 98–107)
CO2: 23 mmol/L (ref 21–32)
CREATININE: 0.97 mg/dL (ref 0.60–1.30)
EGFR (African American): >60
EGFR (Non-African Amer.): 55 — ABNORMAL LOW
Glucose: 216 mg/dL — ABNORMAL HIGH (ref 65–99)
OSMOLALITY: 283 (ref 275–301)
POTASSIUM: 3.7 mmol/L (ref 3.5–5.1)
SGPT (ALT): 16 U/L
Sodium: 138 mmol/L (ref 136–145)
Total Protein: 6.5 g/dL (ref 6.4–8.2)

## 2014-05-08 LAB — CBC
HCT: 35.6 % (ref 35.0–47.0)
HGB: 12 g/dL (ref 12.0–16.0)
MCH: 34.8 pg — ABNORMAL HIGH (ref 26.0–34.0)
MCHC: 33.7 g/dL (ref 32.0–36.0)
MCV: 103 fL — ABNORMAL HIGH (ref 80–100)
PLATELETS: 249 10*3/uL (ref 150–440)
RBC: 3.44 10*6/uL — ABNORMAL LOW (ref 3.80–5.20)
RDW: 13 % (ref 11.5–14.5)
WBC: 6.7 10*3/uL (ref 3.6–11.0)

## 2014-05-08 LAB — URINALYSIS, COMPLETE
BILIRUBIN, UR: NEGATIVE
BLOOD: NEGATIVE
Glucose,UR: NEGATIVE mg/dL (ref 0–75)
Ketone: NEGATIVE
Nitrite: NEGATIVE
PH: 5 (ref 4.5–8.0)
PROTEIN: NEGATIVE
RBC, UR: NONE SEEN /HPF (ref 0–5)
SPECIFIC GRAVITY: 1.005 (ref 1.003–1.030)
Squamous Epithelial: 1
Transitional Epi: <1
WBC UR: 3 /HPF (ref 0–5)

## 2014-05-10 LAB — URINE CULTURE

## 2014-05-18 ENCOUNTER — Ambulatory Visit (INDEPENDENT_AMBULATORY_CARE_PROVIDER_SITE_OTHER): Payer: Medicare Other | Admitting: Internal Medicine

## 2014-05-18 ENCOUNTER — Encounter: Payer: Self-pay | Admitting: Internal Medicine

## 2014-05-18 VITALS — BP 132/76 | HR 88 | Temp 98.5°F | Ht 65.0 in | Wt 211.2 lb

## 2014-05-18 DIAGNOSIS — N3000 Acute cystitis without hematuria: Secondary | ICD-10-CM | POA: Insufficient documentation

## 2014-05-18 DIAGNOSIS — R51 Headache: Secondary | ICD-10-CM

## 2014-05-18 DIAGNOSIS — E118 Type 2 diabetes mellitus with unspecified complications: Secondary | ICD-10-CM | POA: Diagnosis not present

## 2014-05-18 DIAGNOSIS — I10 Essential (primary) hypertension: Secondary | ICD-10-CM | POA: Diagnosis not present

## 2014-05-18 DIAGNOSIS — E538 Deficiency of other specified B group vitamins: Secondary | ICD-10-CM | POA: Diagnosis not present

## 2014-05-18 DIAGNOSIS — R519 Headache, unspecified: Secondary | ICD-10-CM | POA: Insufficient documentation

## 2014-05-18 LAB — LIPID PANEL
CHOL/HDL RATIO: 3
Cholesterol: 152 mg/dL (ref 0–200)
HDL: 60.4 mg/dL (ref >39.00)
LDL CALC: 68 mg/dL (ref 0–99)
NonHDL: 91.6
TRIGLYCERIDES: 118 mg/dL (ref 0.0–149.0)
VLDL: 23.6 mg/dL (ref 0.0–40.0)

## 2014-05-18 LAB — COMPREHENSIVE METABOLIC PANEL
ALT: 13 U/L (ref 0–35)
AST: 22 U/L (ref 0–37)
Albumin: 3.9 g/dL (ref 3.5–5.2)
Alkaline Phosphatase: 53 U/L (ref 39–117)
BILIRUBIN TOTAL: 0.4 mg/dL (ref 0.2–1.2)
BUN: 21 mg/dL (ref 6–23)
CO2: 23 mEq/L (ref 19–32)
CREATININE: 1 mg/dL (ref 0.4–1.2)
Calcium: 10.2 mg/dL (ref 8.4–10.5)
Chloride: 104 mEq/L (ref 96–112)
GFR: 57.89 mL/min — ABNORMAL LOW (ref >60.00)
Glucose, Bld: 158 mg/dL — ABNORMAL HIGH (ref 70–99)
Potassium: 4.3 mEq/L (ref 3.5–5.1)
Sodium: 138 mEq/L (ref 135–145)
Total Protein: 7 g/dL (ref 6.0–8.3)

## 2014-05-18 LAB — HEMOGLOBIN A1C: Hgb A1c MFr Bld: 8 % — ABNORMAL HIGH (ref 4.6–6.5)

## 2014-05-18 LAB — POCT URINALYSIS DIPSTICK
Blood, UA: NEGATIVE
GLUCOSE UA: NEGATIVE
Ketones, UA: 15
Leukocytes, UA: NEGATIVE
NITRITE UA: NEGATIVE
PH UA: 5
SPEC GRAV UA: 1.025
UROBILINOGEN UA: 0.2

## 2014-05-18 MED ORDER — CYANOCOBALAMIN 1000 MCG/ML IJ SOLN
1000.0000 ug | Freq: Once | INTRAMUSCULAR | Status: AC
  Administered 2014-05-18: 1000 ug via INTRAMUSCULAR

## 2014-05-18 NOTE — Assessment & Plan Note (Addendum)
Right facial pain most prominent at site of dental bridge. Question underlying abscess. She will follow up with her dentist for evaluation.

## 2014-05-18 NOTE — Assessment & Plan Note (Signed)
UA normal today. Pt has completed Cipro. Will monitor for any recurrent symptoms.

## 2014-05-18 NOTE — Assessment & Plan Note (Signed)
Will check A1c with labs today. Continue Metformin. 

## 2014-05-18 NOTE — Progress Notes (Signed)
Pre visit review using our clinic review tool, if applicable. No additional management support is needed unless otherwise documented below in the visit note. 

## 2014-05-18 NOTE — Progress Notes (Signed)
 Subjective:    Patient ID: Brenda [name], female    DOB: 07/25/1931, 80 y.o.   MRN: [id number]  HPI 80YO female presents for follow up.  Seen 8/29 at urgent care with UTI. Treated with Cipro. Took this for 7 days.  No dysuria, fever.  Also concerned about right sided facial pain x 1 week. No congestion, fever, cough, ear pain. Pain most prominent around teeth in upper mouth. No fever, chills. Not taking anything for pain.  DM - BG has been well controlled at home. 81 today. Compliant with meds.  Review of Systems  Constitutional: Negative for fever, chills, appetite change, fatigue and unexpected weight change.  HENT: Positive for dental problem. Negative for congestion, ear pain, facial swelling, sore throat, tinnitus, trouble swallowing and voice change.   Eyes: Negative for pain, discharge and visual disturbance.  Respiratory: Negative for shortness of breath.   Cardiovascular: Negative for chest pain and leg swelling.  Gastrointestinal: Negative for abdominal pain.  Genitourinary: Negative for dysuria, urgency, frequency, hematuria and pelvic pain.  Musculoskeletal: Positive for arthralgias and myalgias.  Skin: Negative for color change and rash.  Neurological: Positive for headaches. Negative for dizziness, facial asymmetry and light-headedness.  Hematological: Negative for adenopathy. Does not bruise/bleed easily.  Psychiatric/Behavioral: Negative for dysphoric mood. The patient is not nervous/anxious.        Objective:    BP 132/76  Pulse 88  Temp(Src) 98.5 F (36.9 C) (Oral)  Ht 5' 5" (1.651 m)  Wt 211 lb 4 oz (95.822 kg)  BMI 35.15 kg/m2  SpO2 97% Physical Exam  Constitutional: She is oriented to person, place, and time. She appears well-developed and well-nourished. No distress.  HENT:  Head: Normocephalic and atraumatic.  Right Ear: External ear normal.  Left Ear: External ear normal.  Nose: Nose normal.  Mouth/Throat: Oropharynx is clear and moist. No oral  lesions. Normal dentition. No oropharyngeal exudate.    Eyes: Conjunctivae are normal. Pupils are equal, round, and reactive to light. Right eye exhibits no discharge. Left eye exhibits no discharge. No scleral icterus.  Neck: Normal range of motion. Neck supple. No tracheal deviation present. No thyromegaly present.  Cardiovascular: Normal rate, regular rhythm, normal heart sounds and intact distal pulses.  Exam reveals no gallop and no friction rub.   No murmur heard. Pulmonary/Chest: Effort normal and breath sounds normal. No accessory muscle usage. Not tachypneic. No respiratory distress. She has no decreased breath sounds. She has no wheezes. She has no rhonchi. She has no rales. She exhibits no tenderness.  Musculoskeletal: Normal range of motion. She exhibits no edema and no tenderness.  Lymphadenopathy:    She has no cervical adenopathy.  Neurological: She is alert and oriented to person, place, and time. No cranial nerve deficit. She exhibits normal muscle tone. Coordination normal.  Skin: Skin is warm and dry. No rash noted. She is not diaphoretic. No erythema. No pallor.  Psychiatric: She has a normal mood and affect. Her behavior is normal. Judgment and thought content normal.          Assessment & Plan:   Problem List Items Addressed This Visit     Unprioritized   Acute cystitis without hematuria - Primary     UA normal today. Pt has completed Cipro. Will monitor for any recurrent symptoms.    Relevant Orders      POCT Urinalysis Dipstick (Completed)      CULTURE, URINE COMPREHENSIVE   Diabetes mellitus type 2 with complications       Will check A1c with labs today. Continue Metformin.    Relevant Orders      Comp Met (CMET)      Hemoglobin A1c      Lipid panel   Essential hypertension, benign      BP Readings from Last 3 Encounters:  05/18/14 132/76  01/01/14 124/76  07/31/13 126/70   BP well controlled on current medications. Will check renal function with  labs.    Right facial pain     Right facial pain most prominent at site of dental bridge. Question underlying abscess. She will follow up with her dentist for evaluation.      Other Visit Diagnoses   B12 deficiency        Relevant Medications       cyanocobalamin ((VITAMIN B-12)) injection 1,000 mcg (Completed)        Return in about 4 weeks (around 06/15/2014) for Recheck.

## 2014-05-18 NOTE — Patient Instructions (Addendum)
Labs today.  Please schedule a follow up with your dentist.  Follow up in 4 weeks or sooner as needed.

## 2014-05-18 NOTE — Assessment & Plan Note (Signed)
BP Readings from Last 3 Encounters:  05/18/14 132/76  01/01/14 124/76  07/31/13 126/70   BP well controlled on current medications. Will check renal function with labs.

## 2014-05-19 ENCOUNTER — Ambulatory Visit: Payer: Self-pay | Admitting: Internal Medicine

## 2014-05-19 DIAGNOSIS — Z1231 Encounter for screening mammogram for malignant neoplasm of breast: Secondary | ICD-10-CM | POA: Diagnosis not present

## 2014-05-19 LAB — HM MAMMOGRAPHY

## 2014-05-20 ENCOUNTER — Other Ambulatory Visit: Payer: Self-pay | Admitting: *Deleted

## 2014-05-20 MED ORDER — CIPROFLOXACIN HCL 500 MG PO TABS: 500.0000 mg | ORAL_TABLET | Freq: Two times a day (BID) | ORAL | Status: DC

## 2014-05-21 LAB — CULTURE, URINE COMPREHENSIVE: Colony Count: >=100000

## 2014-05-24 ENCOUNTER — Encounter: Payer: Self-pay | Admitting: Internal Medicine

## 2014-05-25 DIAGNOSIS — E785 Hyperlipidemia, unspecified: Secondary | ICD-10-CM | POA: Diagnosis not present

## 2014-05-25 DIAGNOSIS — Z79899 Other long term (current) drug therapy: Secondary | ICD-10-CM | POA: Diagnosis not present

## 2014-05-25 DIAGNOSIS — E119 Type 2 diabetes mellitus without complications: Secondary | ICD-10-CM | POA: Diagnosis not present

## 2014-05-25 DIAGNOSIS — N3941 Urge incontinence: Secondary | ICD-10-CM | POA: Diagnosis not present

## 2014-05-25 DIAGNOSIS — I10 Essential (primary) hypertension: Secondary | ICD-10-CM | POA: Diagnosis not present

## 2014-05-25 DIAGNOSIS — N3090 Cystitis, unspecified without hematuria: Secondary | ICD-10-CM | POA: Diagnosis not present

## 2014-05-25 DIAGNOSIS — Z8542 Personal history of malignant neoplasm of other parts of uterus: Secondary | ICD-10-CM | POA: Diagnosis not present

## 2014-05-25 DIAGNOSIS — R82998 Other abnormal findings in urine: Secondary | ICD-10-CM | POA: Diagnosis not present

## 2014-05-31 DIAGNOSIS — R5381 Other malaise: Secondary | ICD-10-CM | POA: Diagnosis not present

## 2014-05-31 DIAGNOSIS — N3941 Urge incontinence: Secondary | ICD-10-CM | POA: Diagnosis not present

## 2014-05-31 DIAGNOSIS — IMO0002 Reserved for concepts with insufficient information to code with codable children: Secondary | ICD-10-CM | POA: Diagnosis not present

## 2014-05-31 DIAGNOSIS — N183 Chronic kidney disease, stage 3 unspecified: Secondary | ICD-10-CM | POA: Diagnosis not present

## 2014-05-31 DIAGNOSIS — R5383 Other fatigue: Secondary | ICD-10-CM | POA: Diagnosis not present

## 2014-05-31 DIAGNOSIS — N3020 Other chronic cystitis without hematuria: Secondary | ICD-10-CM | POA: Diagnosis not present

## 2014-06-10 DIAGNOSIS — R809 Proteinuria, unspecified: Secondary | ICD-10-CM | POA: Diagnosis not present

## 2014-06-10 DIAGNOSIS — E1142 Type 2 diabetes mellitus with diabetic polyneuropathy: Secondary | ICD-10-CM | POA: Diagnosis not present

## 2014-06-10 DIAGNOSIS — E538 Deficiency of other specified B group vitamins: Secondary | ICD-10-CM | POA: Diagnosis not present

## 2014-06-10 DIAGNOSIS — R0609 Other forms of dyspnea: Secondary | ICD-10-CM | POA: Diagnosis not present

## 2014-06-28 ENCOUNTER — Other Ambulatory Visit: Payer: Self-pay | Admitting: Internal Medicine

## 2014-06-30 ENCOUNTER — Ambulatory Visit (INDEPENDENT_AMBULATORY_CARE_PROVIDER_SITE_OTHER): Payer: Medicare Other | Admitting: Cardiovascular Disease

## 2014-06-30 ENCOUNTER — Encounter (INDEPENDENT_AMBULATORY_CARE_PROVIDER_SITE_OTHER): Payer: Self-pay

## 2014-06-30 ENCOUNTER — Encounter: Payer: Self-pay | Admitting: Cardiovascular Disease

## 2014-06-30 VITALS — BP 120/78 | HR 78 | Ht 64.0 in | Wt 207.5 lb

## 2014-06-30 DIAGNOSIS — R29898 Other symptoms and signs involving the musculoskeletal system: Secondary | ICD-10-CM

## 2014-06-30 DIAGNOSIS — I10 Essential (primary) hypertension: Secondary | ICD-10-CM | POA: Diagnosis not present

## 2014-06-30 DIAGNOSIS — R0602 Shortness of breath: Secondary | ICD-10-CM | POA: Diagnosis not present

## 2014-06-30 DIAGNOSIS — E669 Obesity, unspecified: Secondary | ICD-10-CM | POA: Diagnosis not present

## 2014-06-30 DIAGNOSIS — R5383 Other fatigue: Secondary | ICD-10-CM

## 2014-06-30 DIAGNOSIS — E118 Type 2 diabetes mellitus with unspecified complications: Secondary | ICD-10-CM

## 2014-06-30 NOTE — Assessment & Plan Note (Signed)
Etiology of her shortness of breath is unclear. Echocardiogram has been ordered for worsening symptoms. We will also schedule her for a pharmacologic Myoview. She is unable to treadmill given her leg weakness, significant shortness of breath with any exertion. If these does look relatively normal, symptoms likely secondary to sedentary life stop and obesity

## 2014-06-30 NOTE — Assessment & Plan Note (Signed)
She is working with Dr. Eddie Dibbles. Recommended continued weight loss, daily walking

## 2014-06-30 NOTE — Assessment & Plan Note (Signed)
Blood pressure is well controlled on today's visit. No changes made to the medications. 

## 2014-06-30 NOTE — Assessment & Plan Note (Signed)
Leg weakness has been progressive. Possibly from inactivity and obesity. If her cardiac testing shows no significant abnormality, would recommend a regular exercise program.

## 2014-06-30 NOTE — Progress Notes (Signed)
Patient ID: Brenda [name], female    DOB: 15-Jan-1931, 80 y.o.   MRN: [id number]  HPI Comments: Brenda [name] is a 80 year old woman with obesity, diabetes type 2, hypertension,  patient of Dr. Gilford Rile and Dr. Eddie Dibbles,  history of incontinence, prior upper GI bleed seen by Dr. Tiffany Kocher, who presents for routine followup for shortness of breath, fatigue.  She reports that for the past year or so she has had worsening shortness of breath. She has part-time jobs that she does and she has had a difficult time doing knees. Previously  was mowing and watching a swimming pool. She states that people started taking care of her rather than her taking care of them. Weight has been relatively stable. She does not do any regular exercise. She denies any lower extremity edema. Weight on her prior clinic visit was 217 pounds now down to 207 pounds She does have problems with her sleep but has had this for many years Recent lab work showing total cholesterol 152, LDL 68, hemoglobin A1c 8.0  She denies any significant chest pain. No lightheadedness or dizziness.  She continues to have problems with  incontinence and poor gait. History of prior falls.  EKG shows normal sinus rhythm with rate 78 beats per minute, no significant ST or T wave changes      Outpatient Encounter Prescriptions as of 06/30/2014  Medication Sig  . ferrous sulfate 324 (65 FE) MG TBEC Take 1 tablet by mouth 3 (three) times daily.  . fesoterodine (TOVIAZ) 8 MG TB24 Take 8 mg by mouth daily.  Marland Kitchen gabapentin (NEURONTIN) 100 MG capsule Take 100 mg by mouth as needed. Take once a day when needed for tingling in her feet.  Marland Kitchen glipiZIDE (GLUCOTROL XL) 10 MG 24 hr tablet TAKE 1 TABLET BY MOUTH TWICE DAILY  . lisinopril (PRINIVIL,ZESTRIL) 10 MG tablet TAKE 1 TABLET BY MOUTH EVERY DAY  . metFORMIN (GLUCOPHAGE) 1000 MG tablet TAKE 1 TABLET BY MOUTH TWICE DAILY WITH A MEAL  . pantoprazole (PROTONIX) 40 MG tablet Take 40 mg by mouth 2 (two) times daily.  .  sucralfate (CARAFATE) 1 G tablet Take 1 g by mouth 2 (two) times daily.  Marland Kitchen VITAMIN B1-B12 IJ Inject 1,000 mcg as directed every 30 (thirty) days.   Review of Systems  Constitutional: Positive for fatigue.  HENT: Negative.   Eyes: Negative.   Respiratory: Positive for shortness of breath.   Cardiovascular: Negative.   Gastrointestinal: Negative.   Endocrine: Negative.   Musculoskeletal: Positive for gait problem.  Skin: Negative.   Allergic/Immunologic: Negative.   Neurological: Positive for weakness.  Hematological: Negative.   Psychiatric/Behavioral: Negative.   All other systems reviewed and are negative.   BP 120/78  Pulse 78  Ht 5\' 4"  (1.626 m)  Wt 207 lb 8 oz (94.121 kg)  BMI 35.60 kg/m2  Physical Exam  Nursing note and vitals reviewed. Constitutional: She is oriented to person, place, and time. She appears well-developed and well-nourished.  HENT:  Head: Normocephalic.  Nose: Nose normal.  Mouth/Throat: Oropharynx is clear and moist.  Eyes: Conjunctivae are normal. Pupils are equal, round, and reactive to light.  Neck: Normal range of motion. Neck supple. No JVD present.  Cardiovascular: Normal rate, regular rhythm, S1 normal, S2 normal, normal heart sounds and intact distal pulses.  Exam reveals no gallop and no friction rub.   No murmur heard. Pulmonary/Chest: Effort normal and breath sounds normal. No respiratory distress. She has no wheezes. She has no rales. She  exhibits no tenderness.  Abdominal: Soft. Bowel sounds are normal. She exhibits no distension. There is no tenderness.  Musculoskeletal: Normal range of motion. She exhibits no edema and no tenderness.  Lymphadenopathy:    She has no cervical adenopathy.  Neurological: She is alert and oriented to person, place, and time. Coordination normal.  Skin: Skin is warm and dry. No rash noted. No erythema.  Psychiatric: She has a normal mood and affect. Her behavior is normal. Judgment and thought content  normal.    Assessment and Plan

## 2014-06-30 NOTE — Patient Instructions (Addendum)
You are doing well. No medication changes were made.  We will schedule a stress test on October 27th, for shortness of breath, fatigue  We will also schedule a echocardiogram for shortness of breath, fatigure  Please call us if you have new issues that need to be addressed before your next appt.   Your next appointment will be scheduled in our new office located at :  [facility]  [address] [place], [place] [address]  Victoria caregiver has ordered a Stress Test with nuclear imaging. The purpose of this test is to evaluate the blood supply to your heart muscle. This procedure is referred to as a "Non-Invasive Stress Test." This is because other than having an IV started in your vein, nothing is inserted or "invades" your body. Cardiac stress tests are done to find areas of poor blood flow to the heart by determining the extent of coronary artery disease (CAD). Some patients exercise on a treadmill, which naturally increases the blood flow to your heart, while others who are  unable to walk on a treadmill due to physical limitations have a pharmacologic/chemical stress agent called Lexiscan . This medicine will mimic walking on a treadmill by temporarily increasing your coronary blood flow.   Please note: these test may take anywhere between 2-4 hours to complete  PLEASE REPORT TO [facility] AT THE FIRST DESK WILL DIRECT YOU WHERE TO GO  Date of Procedure:________Tuesday, October 27____  Arrival Time for Procedure:______7:45am____________  Instructions regarding medication:   __X__ : Hold diabetes medication morning of procedure  PLEASE NOTIFY THE OFFICE AT LEAST 24 HOURS IN ADVANCE IF YOU ARE UNABLE TO KEEP YOUR APPOINTMENT.  [phone] AND  PLEASE NOTIFY NUCLEAR MEDICINE AT [facility] AT LEAST 24 HOURS IN ADVANCE IF YOU ARE UNABLE TO KEEP YOUR APPOINTMENT. [phone]  How to prepare for your Myoview  test:  1. Do not eat or drink after midnight 2. No caffeine for 24 hours prior to test 3. No smoking 24 hours prior to test. 4. Your medication may be taken with water.  If your doctor stopped a medication because of this test, do not take that medication. 5. Ladies, please do not wear dresses.  Skirts or pants are appropriate. Please wear a short sleeve shirt. 6. No perfume, cologne or lotion. 7. Wear comfortable walking shoes. No heels!

## 2014-06-30 NOTE — Assessment & Plan Note (Signed)
We have encouraged continued exercise, careful diet management in an effort to lose weight. 

## 2014-07-06 ENCOUNTER — Ambulatory Visit: Payer: Self-pay | Admitting: Cardiovascular Disease

## 2014-07-06 DIAGNOSIS — R5383 Other fatigue: Secondary | ICD-10-CM | POA: Diagnosis not present

## 2014-07-06 DIAGNOSIS — R0602 Shortness of breath: Secondary | ICD-10-CM | POA: Diagnosis not present

## 2014-07-07 ENCOUNTER — Other Ambulatory Visit: Payer: Self-pay

## 2014-07-07 DIAGNOSIS — R0602 Shortness of breath: Secondary | ICD-10-CM

## 2014-07-07 DIAGNOSIS — R5383 Other fatigue: Secondary | ICD-10-CM

## 2014-07-09 ENCOUNTER — Encounter: Payer: Self-pay | Admitting: Internal Medicine

## 2014-07-09 ENCOUNTER — Ambulatory Visit (INDEPENDENT_AMBULATORY_CARE_PROVIDER_SITE_OTHER): Payer: Medicare Other | Admitting: Internal Medicine

## 2014-07-09 VITALS — BP 102/66 | HR 73 | Temp 98.5°F | Ht 65.0 in | Wt 205.8 lb

## 2014-07-09 DIAGNOSIS — R0602 Shortness of breath: Secondary | ICD-10-CM | POA: Diagnosis not present

## 2014-07-09 DIAGNOSIS — E118 Type 2 diabetes mellitus with unspecified complications: Secondary | ICD-10-CM

## 2014-07-09 DIAGNOSIS — Z23 Encounter for immunization: Secondary | ICD-10-CM

## 2014-07-09 DIAGNOSIS — M545 Low back pain, unspecified: Secondary | ICD-10-CM | POA: Insufficient documentation

## 2014-07-09 DIAGNOSIS — E538 Deficiency of other specified B group vitamins: Secondary | ICD-10-CM | POA: Diagnosis not present

## 2014-07-09 DIAGNOSIS — I10 Essential (primary) hypertension: Secondary | ICD-10-CM | POA: Diagnosis not present

## 2014-07-09 LAB — CBC WITH DIFFERENTIAL/PLATELET
Basophils Absolute: 0 10*3/uL (ref 0.0–0.1)
Basophils Relative: 0.5 % (ref 0.0–3.0)
Eosinophils Absolute: 0.3 10*3/uL (ref 0.0–0.7)
Eosinophils Relative: 4.7 % (ref 0.0–5.0)
HCT: 37.4 % (ref 36.0–46.0)
HEMOGLOBIN: 12.4 g/dL (ref 12.0–15.0)
Lymphocytes Relative: 26.2 % (ref 12.0–46.0)
Lymphs Abs: 1.7 10*3/uL (ref 0.7–4.0)
MCHC: 33.2 g/dL (ref 30.0–36.0)
MCV: 103.9 fl — ABNORMAL HIGH (ref 78.0–100.0)
Monocytes Absolute: 0.5 10*3/uL (ref 0.1–1.0)
Monocytes Relative: 7.1 % (ref 3.0–12.0)
NEUTROS ABS: 4.1 10*3/uL (ref 1.4–7.7)
Neutrophils Relative %: 61.5 % (ref 43.0–77.0)
Platelets: 281 10*3/uL (ref 150.0–400.0)
RBC: 3.6 Mil/uL — ABNORMAL LOW (ref 3.87–5.11)
RDW: 12.8 % (ref 11.5–15.5)
WBC: 6.6 10*3/uL (ref 4.0–10.5)

## 2014-07-09 LAB — COMPREHENSIVE METABOLIC PANEL
ALBUMIN: 3.5 g/dL (ref 3.5–5.2)
ALK PHOS: 72 U/L (ref 39–117)
ALT: 12 U/L (ref 0–35)
AST: 22 U/L (ref 0–37)
BUN: 13 mg/dL (ref 6–23)
CALCIUM: 9.9 mg/dL (ref 8.4–10.5)
CO2: 22 mEq/L (ref 19–32)
Chloride: 102 mEq/L (ref 96–112)
Creatinine, Ser: 0.9 mg/dL (ref 0.4–1.2)
GFR: 61.48 mL/min (ref >60.00)
GLUCOSE: 161 mg/dL — AB (ref 70–99)
POTASSIUM: 4.8 meq/L (ref 3.5–5.1)
Sodium: 134 mEq/L — ABNORMAL LOW (ref 135–145)
TOTAL PROTEIN: 6.9 g/dL (ref 6.0–8.3)
Total Bilirubin: 0.3 mg/dL (ref 0.2–1.2)

## 2014-07-09 MED ORDER — CYANOCOBALAMIN 1000 MCG/ML IJ SOLN
1000.0000 ug | Freq: Once | INTRAMUSCULAR | Status: AC
  Administered 2014-07-09: 1000 ug via INTRAMUSCULAR

## 2014-07-09 MED ORDER — CYCLOBENZAPRINE HCL 10 MG PO TABS: 10.0000 mg | ORAL_TABLET | Freq: Three times a day (TID) | ORAL | Status: DC | PRN

## 2014-07-09 NOTE — Assessment & Plan Note (Signed)
BP Readings from Last 3 Encounters:  07/09/14 102/66  06/30/14 120/78  05/18/14 132/76   BP well controlled on current medication. Will continue.

## 2014-07-09 NOTE — Assessment & Plan Note (Signed)
Secondary to DJD and OA. Will try adding Flexeril at night to help with pain. Discussed risks of this medication.

## 2014-07-09 NOTE — Addendum Note (Signed)
Addended by: Vernetta Honey on: 07/09/2014 03:47 PM   Modules accepted: Orders

## 2014-07-09 NOTE — Assessment & Plan Note (Signed)
Reviewed notes from Dr. Eddie Dibbles at [facility]. Continue Metformin and Glipizide. Next A1c in 09/2014.

## 2014-07-09 NOTE — Assessment & Plan Note (Signed)
Recent worsening dyspnea with exertion. Exam normal today. Reviewed recent stress test which was normal. Encouraged her to consider starting cardiac rehab to help with exercise tolerance. Will also check CBC and CMP today.

## 2014-07-09 NOTE — Progress Notes (Signed)
Subjective:    Patient ID: Brenda [name], female    DOB: Jan 30, 1931, 80 y.o.   MRN: [id number]  HPI 80YO female with diabetes, obesity, GERD, osteoarthritis presents for followup.  Recently having some shortness of breath. Seen by Cardiology. Stress test was normal. Continues to have shortness of breath with minimal exertion. No chest pain, palpitations.  DM - BG running 70-80 at night. Dose of Glipizide was recently lowered by Dr. Eddie Dibbles. Scheduled for follow up in January 2016.  Continues to have severe lower back pain, worse in the mornings, and bilateral knee pain. This has increased with cooler weather. Not currently taking anything for this.  Review of Systems  Constitutional: Positive for fatigue. Negative for fever, chills, appetite change and unexpected weight change.  Eyes: Negative for visual disturbance.  Respiratory: Positive for shortness of breath. Negative for cough and wheezing.   Cardiovascular: Negative for chest pain and leg swelling.  Gastrointestinal: Negative for nausea, vomiting, abdominal pain, diarrhea and constipation.  Musculoskeletal: Negative for arthralgias and myalgias.  Skin: Negative for color change and rash.  Hematological: Negative for adenopathy. Does not bruise/bleed easily.  Psychiatric/Behavioral: Negative for dysphoric mood. The patient is not nervous/anxious.        Objective:    BP 102/66  Pulse 73  Temp(Src) 98.5 F (36.9 C) (Oral)  Ht 5\' 5"  (1.651 m)  Wt 205 lb 12 oz (93.328 kg)  BMI 34.24 kg/m2  SpO2 96% Physical Exam  Constitutional: She is oriented to person, place, and time. She appears well-developed and well-nourished. No distress.  HENT:  Head: Normocephalic and atraumatic.  Right Ear: External ear normal.  Left Ear: External ear normal.  Nose: Nose normal.  Mouth/Throat: Oropharynx is clear and moist. No oropharyngeal exudate.  Eyes: Conjunctivae are normal. Pupils are equal, round, and reactive to light. Right eye  exhibits no discharge. Left eye exhibits no discharge. No scleral icterus.  Neck: Normal range of motion. Neck supple. No tracheal deviation present. No thyromegaly present.  Cardiovascular: Normal rate, regular rhythm, normal heart sounds and intact distal pulses.  Exam reveals no gallop and no friction rub.   No murmur heard. Pulmonary/Chest: Effort normal and breath sounds normal. No accessory muscle usage. Not tachypneic. No respiratory distress. She has no decreased breath sounds. She has no wheezes. She has no rhonchi. She has no rales. She exhibits no tenderness.  Musculoskeletal: She exhibits no edema and no tenderness.       Lumbar back: She exhibits decreased range of motion and pain. She exhibits no tenderness.  Lymphadenopathy:    She has no cervical adenopathy.  Neurological: She is alert and oriented to person, place, and time. No cranial nerve deficit. She exhibits normal muscle tone. Coordination normal.  Skin: Skin is warm and dry. No rash noted. She is not diaphoretic. No erythema. No pallor.  Psychiatric: She has a normal mood and affect. Her behavior is normal. Judgment and thought content normal.          Assessment & Plan:   Problem List Items Addressed This Visit     Unprioritized   Diabetes mellitus type 2 with complications     Reviewed notes from Dr. Eddie Dibbles at [facility]. Continue Metformin and Glipizide. Next A1c in 09/2014.    Essential hypertension, benign      BP Readings from Last 3 Encounters:  07/09/14 102/66  06/30/14 120/78  05/18/14 132/76   BP well controlled on current medication. Will continue.    Lumbago  Secondary to DJD and OA. Will try adding Flexeril at night to help with pain. Discussed risks of this medication.     Relevant Medications      cyclobenzaprine (FLEXERIL) tablet   Shortness of breath - Primary     Recent worsening dyspnea with exertion. Exam normal today. Reviewed recent stress test which was normal. Encouraged her to  consider starting cardiac rehab to help with exercise tolerance. Will also check CBC and CMP today.    Relevant Orders      CBC with Differential      Comprehensive metabolic panel       Return in about 6 months (around 01/08/2015) for Wellness Visit.

## 2014-07-09 NOTE — Progress Notes (Signed)
Pre visit review using our clinic review tool, if applicable. No additional management support is needed unless otherwise documented below in the visit note. 

## 2014-07-09 NOTE — Patient Instructions (Addendum)
Consider starting cardiac rehab to help increase exercise tolerance.  Follow up with Dr. Eddie Dibbles in 09/2014.  Start Flexeril 5mg  at bedtime as needed for pain. Do not drive while taking this medication.

## 2014-07-12 ENCOUNTER — Encounter: Payer: Self-pay | Admitting: *Deleted

## 2014-07-15 ENCOUNTER — Other Ambulatory Visit: Payer: Self-pay

## 2014-07-15 ENCOUNTER — Other Ambulatory Visit (INDEPENDENT_AMBULATORY_CARE_PROVIDER_SITE_OTHER): Payer: Medicare Other

## 2014-07-15 DIAGNOSIS — R0602 Shortness of breath: Secondary | ICD-10-CM | POA: Diagnosis not present

## 2014-07-15 DIAGNOSIS — R5383 Other fatigue: Secondary | ICD-10-CM

## 2014-08-18 ENCOUNTER — Ambulatory Visit (INDEPENDENT_AMBULATORY_CARE_PROVIDER_SITE_OTHER): Payer: Medicare Other | Admitting: *Deleted

## 2014-08-18 DIAGNOSIS — E538 Deficiency of other specified B group vitamins: Secondary | ICD-10-CM

## 2014-08-18 MED ORDER — CYANOCOBALAMIN 1000 MCG/ML IJ SOLN
1000.0000 ug | Freq: Once | INTRAMUSCULAR | Status: AC
  Administered 2014-08-18: 1000 ug via INTRAMUSCULAR

## 2014-09-23 ENCOUNTER — Other Ambulatory Visit: Payer: Self-pay | Admitting: Internal Medicine

## 2014-10-08 ENCOUNTER — Other Ambulatory Visit: Payer: Self-pay | Admitting: Internal Medicine

## 2014-10-26 ENCOUNTER — Other Ambulatory Visit: Payer: Self-pay | Admitting: *Deleted

## 2014-10-26 MED ORDER — GABAPENTIN 100 MG PO CAPS: 100.0000 mg | ORAL_CAPSULE | ORAL | Status: DC | PRN

## 2014-10-28 ENCOUNTER — Ambulatory Visit (INDEPENDENT_AMBULATORY_CARE_PROVIDER_SITE_OTHER): Payer: Medicare Other | Admitting: *Deleted

## 2014-10-28 DIAGNOSIS — E538 Deficiency of other specified B group vitamins: Secondary | ICD-10-CM

## 2014-10-28 MED ORDER — CYANOCOBALAMIN 1000 MCG/ML IJ SOLN
1000.0000 ug | Freq: Once | INTRAMUSCULAR | Status: AC
  Administered 2014-10-28: 1000 ug via INTRAMUSCULAR

## 2014-11-08 ENCOUNTER — Ambulatory Visit: Payer: Self-pay | Admitting: Registered Nurse

## 2014-11-08 DIAGNOSIS — Z90710 Acquired absence of both cervix and uterus: Secondary | ICD-10-CM | POA: Diagnosis not present

## 2014-11-08 DIAGNOSIS — M4800 Spinal stenosis, site unspecified: Secondary | ICD-10-CM | POA: Diagnosis not present

## 2014-11-08 DIAGNOSIS — Z79899 Other long term (current) drug therapy: Secondary | ICD-10-CM | POA: Diagnosis not present

## 2014-11-08 DIAGNOSIS — N390 Urinary tract infection, site not specified: Secondary | ICD-10-CM | POA: Diagnosis not present

## 2014-11-08 DIAGNOSIS — N809 Endometriosis, unspecified: Secondary | ICD-10-CM | POA: Diagnosis not present

## 2014-11-08 DIAGNOSIS — J329 Chronic sinusitis, unspecified: Secondary | ICD-10-CM | POA: Diagnosis not present

## 2014-11-08 DIAGNOSIS — I10 Essential (primary) hypertension: Secondary | ICD-10-CM | POA: Diagnosis not present

## 2014-11-08 DIAGNOSIS — E118 Type 2 diabetes mellitus with unspecified complications: Secondary | ICD-10-CM | POA: Diagnosis not present

## 2014-11-15 ENCOUNTER — Telehealth: Payer: Self-pay | Admitting: Internal Medicine

## 2014-11-15 NOTE — Telephone Encounter (Signed)
The patient wanting an appointment this week . She can't sleep , can't hold her urine.

## 2014-11-16 ENCOUNTER — Other Ambulatory Visit: Payer: Self-pay | Admitting: *Deleted

## 2014-11-16 ENCOUNTER — Encounter: Payer: Self-pay | Admitting: Internal Medicine

## 2014-11-16 ENCOUNTER — Ambulatory Visit (INDEPENDENT_AMBULATORY_CARE_PROVIDER_SITE_OTHER): Payer: Medicare Other | Admitting: Internal Medicine

## 2014-11-16 VITALS — BP 121/77 | HR 89 | Temp 98.3°F | Ht 65.0 in | Wt 209.2 lb

## 2014-11-16 DIAGNOSIS — I10 Essential (primary) hypertension: Secondary | ICD-10-CM | POA: Diagnosis not present

## 2014-11-16 DIAGNOSIS — J020 Streptococcal pharyngitis: Secondary | ICD-10-CM | POA: Diagnosis not present

## 2014-11-16 DIAGNOSIS — G4700 Insomnia, unspecified: Secondary | ICD-10-CM | POA: Diagnosis not present

## 2014-11-16 DIAGNOSIS — E118 Type 2 diabetes mellitus with unspecified complications: Secondary | ICD-10-CM

## 2014-11-16 DIAGNOSIS — Z139 Encounter for screening, unspecified: Secondary | ICD-10-CM | POA: Diagnosis not present

## 2014-11-16 LAB — POCT URINALYSIS DIPSTICK
Bilirubin, UA: NEGATIVE
Glucose, UA: NEGATIVE
Leukocytes, UA: NEGATIVE
Nitrite, UA: NEGATIVE
PROTEIN UA: NEGATIVE
RBC UA: NEGATIVE
SPEC GRAV UA: 1.02
Urobilinogen, UA: 0.2
pH, UA: 5

## 2014-11-16 LAB — POCT RAPID STREP A (OFFICE): Rapid Strep A Screen: POSITIVE — AB

## 2014-11-16 MED ORDER — TRAZODONE HCL 50 MG PO TABS: 25.0000 mg | ORAL_TABLET | Freq: Every evening | ORAL | Status: DC | PRN

## 2014-11-16 MED ORDER — AMOXICILLIN-POT CLAVULANATE 875-125 MG PO TABS: 1.0000 | ORAL_TABLET | Freq: Two times a day (BID) | ORAL | Status: DC

## 2014-11-16 NOTE — Assessment & Plan Note (Signed)
BP Readings from Last 3 Encounters:  11/16/14 121/77  07/09/14 102/66  06/30/14 120/78   BP well controlled. Will check renal function with labs in 2 weeks.

## 2014-11-16 NOTE — Assessment & Plan Note (Signed)
Chronic insomnia. No improvement with OTC meds. Will start Trazodone 25-50mg  daily at bedtime. Follow up in 2 weeks and prn.

## 2014-11-16 NOTE — Progress Notes (Signed)
Pre visit review using our clinic review tool, if applicable. No additional management support is needed unless otherwise documented below in the visit note. 

## 2014-11-16 NOTE — Patient Instructions (Addendum)
Stop Cefuroxime.  Start Augmentin twice daily for 10 days.  Please take a probiotic yogurt daily while on this medication.  Start Trazodone 25mg  - 50mg  one hour before bedtime to help with sleep.  Follow up in 2 weeks or sooner as needed.

## 2014-11-16 NOTE — Progress Notes (Signed)
Subjective:    Patient ID: Brenda [name], female    DOB: 04-22-1931, 81 y.o.   MRN: [id number]  HPI 81YO female presents for follow up.  Recently seen at urgent care and treated with Cefuroxime for strep throat, UTI and sinus infection.  Continues to have sore throat pain. Feels that throat is swollen. Has been taking Cefuroxime with no improvement. No fever, chills. Feels fatigued. Has some urinary urgency, but no dysuria. No hematuria. Had some foul smell to urine a few weeks ago, but this has resolved.  Having trouble falling asleep. Some times goes, 2-3 days without sleeping. Tried using Tylenol PM with no improvement.  Past medical, surgical, family and social history per today's encounter.  Review of Systems  Constitutional: Negative for fever, chills, appetite change, fatigue and unexpected weight change.  HENT: Positive for postnasal drip and sore throat. Negative for congestion, rhinorrhea, sinus pressure, trouble swallowing and voice change.   Eyes: Negative for visual disturbance.  Respiratory: Negative for cough and shortness of breath.   Cardiovascular: Negative for chest pain and leg swelling.  Gastrointestinal: Negative for nausea, vomiting, abdominal pain, diarrhea and constipation.  Skin: Negative for color change and rash.  Hematological: Negative for adenopathy. Does not bruise/bleed easily.  Psychiatric/Behavioral: Positive for sleep disturbance. Negative for dysphoric mood. The patient is not nervous/anxious.        Objective:    BP 121/77 mmHg  Pulse 89  Temp(Src) 98.3 F (36.8 C) (Oral)  Ht 5\' 5"  (1.651 m)  Wt 209 lb 4 oz (94.915 kg)  BMI 34.82 kg/m2  SpO2 96% Physical Exam  Constitutional: She is oriented to person, place, and time. She appears well-developed and well-nourished. No distress.  HENT:  Head: Normocephalic and atraumatic.  Right Ear: External ear normal.  Left Ear: External ear normal.  Nose: Nose normal.  Mouth/Throat:  Posterior oropharyngeal erythema present. No oropharyngeal exudate.  Eyes: Conjunctivae are normal. Pupils are equal, round, and reactive to light. Right eye exhibits no discharge. Left eye exhibits no discharge. No scleral icterus.  Neck: Normal range of motion. Neck supple. No tracheal deviation present. No thyromegaly present.  Cardiovascular: Normal rate, regular rhythm, normal heart sounds and intact distal pulses.  Exam reveals no gallop and no friction rub.   No murmur heard. Pulmonary/Chest: Effort normal and breath sounds normal. No respiratory distress. She has no wheezes. She has no rales. She exhibits no tenderness.  Musculoskeletal: Normal range of motion. She exhibits no edema or tenderness.  Lymphadenopathy:    She has no cervical adenopathy.  Neurological: She is alert and oriented to person, place, and time. No cranial nerve deficit. She exhibits normal muscle tone. Coordination normal.  Skin: Skin is warm and dry. No rash noted. She is not diaphoretic. No erythema. No pallor.  Psychiatric: She has a normal mood and affect. Her behavior is normal. Judgment and thought content normal.          Assessment & Plan:   Problem List Items Addressed This Visit      Unprioritized   Diabetes mellitus type 2 with complications    Will plan to repeat A1c with labs at follow up in 2 weeks.      Essential hypertension, benign    BP Readings from Last 3 Encounters:  11/16/14 121/77  07/09/14 102/66  06/30/14 120/78   BP well controlled. Will check renal function with labs in 2 weeks.      Insomnia    Chronic insomnia. No  improvement with OTC meds. Will start Trazodone 25-50mg  daily at bedtime. Follow up in 2 weeks and prn.      Streptococcal sore throat - Primary    Symptoms and exam c/w strep throat. Will change to Augmentin, as this gave better clearance in adults in studies. Encouraged her to take probiotic yogurt while on Augmentin. Follow up recheck in 2 weeks and  prn.      Relevant Medications   AMOXICILLIN-POT CLAVULANATE 875-125 MG PO TABS   Other Relevant Orders   POCT urinalysis dipstick (Completed)    Other Visit Diagnoses    Screening        Relevant Orders    POCT urinalysis dipstick (Completed)        Return in about 2 weeks (around 11/30/2014) for Recheck of Diabetes.

## 2014-11-16 NOTE — Assessment & Plan Note (Signed)
Symptoms and exam c/w strep throat. Will change to Augmentin, as this gave better clearance in adults in studies. Encouraged her to take probiotic yogurt while on Augmentin. Follow up recheck in 2 weeks and prn.

## 2014-11-16 NOTE — Addendum Note (Signed)
Addended by: Vernetta Honey on: 11/16/2014 02:36 PM   Modules accepted: Orders

## 2014-11-16 NOTE — Assessment & Plan Note (Signed)
Will plan to repeat A1c with labs at follow up in 2 weeks.

## 2014-11-29 DIAGNOSIS — N3090 Cystitis, unspecified without hematuria: Secondary | ICD-10-CM | POA: Diagnosis not present

## 2014-11-29 DIAGNOSIS — R32 Unspecified urinary incontinence: Secondary | ICD-10-CM | POA: Diagnosis not present

## 2014-12-02 ENCOUNTER — Encounter: Payer: Self-pay | Admitting: Internal Medicine

## 2014-12-02 ENCOUNTER — Ambulatory Visit (INDEPENDENT_AMBULATORY_CARE_PROVIDER_SITE_OTHER): Payer: Medicare Other | Admitting: Internal Medicine

## 2014-12-02 VITALS — BP 132/80 | HR 78 | Temp 98.2°F | Ht 65.0 in | Wt 205.4 lb

## 2014-12-02 DIAGNOSIS — E118 Type 2 diabetes mellitus with unspecified complications: Secondary | ICD-10-CM | POA: Diagnosis not present

## 2014-12-02 DIAGNOSIS — I10 Essential (primary) hypertension: Secondary | ICD-10-CM

## 2014-12-02 DIAGNOSIS — G4700 Insomnia, unspecified: Secondary | ICD-10-CM

## 2014-12-02 DIAGNOSIS — E538 Deficiency of other specified B group vitamins: Secondary | ICD-10-CM | POA: Diagnosis not present

## 2014-12-02 LAB — COMPREHENSIVE METABOLIC PANEL
ALBUMIN: 4 g/dL (ref 3.5–5.2)
ALK PHOS: 78 U/L (ref 39–117)
ALT: 14 U/L (ref 0–35)
AST: 18 U/L (ref 0–37)
BILIRUBIN TOTAL: 0.5 mg/dL (ref 0.2–1.2)
BUN: 19 mg/dL (ref 6–23)
CO2: 26 mEq/L (ref 19–32)
CREATININE: 0.96 mg/dL (ref 0.40–1.20)
Calcium: 10 mg/dL (ref 8.4–10.5)
Chloride: 104 mEq/L (ref 96–112)
GFR: 59.21 mL/min — ABNORMAL LOW (ref >60.00)
GLUCOSE: 138 mg/dL — AB (ref 70–99)
Potassium: 4.2 mEq/L (ref 3.5–5.1)
Sodium: 137 mEq/L (ref 135–145)
Total Protein: 6.7 g/dL (ref 6.0–8.3)

## 2014-12-02 LAB — LIPID PANEL
CHOL/HDL RATIO: 3
Cholesterol: 158 mg/dL (ref 0–200)
HDL: 56 mg/dL (ref >39.00)
LDL Cholesterol: 84 mg/dL (ref 0–99)
NONHDL: 102
Triglycerides: 90 mg/dL (ref 0.0–149.0)
VLDL: 18 mg/dL (ref 0.0–40.0)

## 2014-12-02 LAB — VITAMIN B12: Vitamin B-12: 598 pg/mL (ref 211–911)

## 2014-12-02 LAB — CBC WITH DIFFERENTIAL/PLATELET
BASOS ABS: 0 10*3/uL (ref 0.0–0.1)
Basophils Relative: 0.8 % (ref 0.0–3.0)
Eosinophils Absolute: 0.3 10*3/uL (ref 0.0–0.7)
Eosinophils Relative: 4.3 % (ref 0.0–5.0)
HCT: 35.8 % — ABNORMAL LOW (ref 36.0–46.0)
Hemoglobin: 12.1 g/dL (ref 12.0–15.0)
LYMPHS ABS: 1.5 10*3/uL (ref 0.7–4.0)
Lymphocytes Relative: 24.4 % (ref 12.0–46.0)
MCHC: 33.7 g/dL (ref 30.0–36.0)
MCV: 100.9 fl — ABNORMAL HIGH (ref 78.0–100.0)
MONO ABS: 0.4 10*3/uL (ref 0.1–1.0)
MONOS PCT: 7.2 % (ref 3.0–12.0)
NEUTROS ABS: 3.8 10*3/uL (ref 1.4–7.7)
Neutrophils Relative %: 63.3 % (ref 43.0–77.0)
Platelets: 276 10*3/uL (ref 150.0–400.0)
RBC: 3.55 Mil/uL — ABNORMAL LOW (ref 3.87–5.11)
RDW: 12.5 % (ref 11.5–15.5)
WBC: 6.1 10*3/uL (ref 4.0–10.5)

## 2014-12-02 LAB — MICROALBUMIN / CREATININE URINE RATIO
CREATININE, U: 193.9 mg/dL
Microalb Creat Ratio: 1 mg/g (ref 0.0–30.0)
Microalb, Ur: 2 mg/dL — ABNORMAL HIGH (ref 0.0–1.9)

## 2014-12-02 LAB — HEMOGLOBIN A1C: Hgb A1c MFr Bld: 8 % — ABNORMAL HIGH (ref 4.6–6.5)

## 2014-12-02 NOTE — Assessment & Plan Note (Signed)
BP Readings from Last 3 Encounters:  12/02/14 132/80  11/16/14 121/77  07/09/14 102/66   BP well controlled. Renal function with labs today. Continue Lisinopril

## 2014-12-02 NOTE — Assessment & Plan Note (Signed)
Symptoms improved somewhat on Trazodone. Discussed good sleep hygiene including avoiding caffeine before bedtime. Follow up prn.

## 2014-12-02 NOTE — Assessment & Plan Note (Signed)
Will check A1c with labs today. Continue Metformin and Glipizide. 

## 2014-12-02 NOTE — Progress Notes (Signed)
Pre visit review using our clinic review tool, if applicable. No additional management support is needed unless otherwise documented below in the visit note. 

## 2014-12-02 NOTE — Progress Notes (Signed)
Subjective:    Patient ID: Brenda [name], female    DOB: 18-Jul-1931, 81 y.o.   MRN: [id number]  HPI  81YO female presents for follow up.  Last seen 3/8 for strep throat and insomnia. Started on Trazodone.  Strep throat - Symptoms have improved. No further fever, chills. No sore throat. Completed antibiotics.  Insomnia - Initially had some improvement, then developed drowsiness during the day. Seems to be variable how well it works. Would like to keep using the mediation.  DM - Blood sugars have been up and down, 81 this morning. Occasional blood sugar over 200 in the afternoon.  Past medical, surgical, family and social history per today's encounter.  Review of Systems  Constitutional: Positive for fatigue. Negative for fever, chills, appetite change and unexpected weight change.  Eyes: Negative for visual disturbance.  Respiratory: Negative for shortness of breath.   Cardiovascular: Negative for chest pain and leg swelling.  Gastrointestinal: Negative for nausea, vomiting, abdominal pain, diarrhea and constipation.  Musculoskeletal: Negative for myalgias and arthralgias.  Skin: Negative for color change and rash.  Hematological: Negative for adenopathy. Does not bruise/bleed easily.  Psychiatric/Behavioral: Positive for sleep disturbance. Negative for dysphoric mood. The patient is not nervous/anxious.        Objective:    BP 132/80 mmHg  Pulse 78  Temp(Src) 98.2 F (36.8 C) (Oral)  Ht 5\' 5"  (1.651 m)  Wt 205 lb 6 oz (93.157 kg)  BMI 34.18 kg/m2  SpO2 96% Physical Exam  Constitutional: She is oriented to person, place, and time. She appears well-developed and well-nourished. No distress.  HENT:  Head: Normocephalic and atraumatic.  Right Ear: External ear normal.  Left Ear: External ear normal.  Nose: Nose normal.  Mouth/Throat: Oropharynx is clear and moist. No oropharyngeal exudate.  Eyes: Conjunctivae are normal. Pupils are equal, round, and reactive to light.  Right eye exhibits no discharge. Left eye exhibits no discharge. No scleral icterus.  Neck: Normal range of motion. Neck supple. No tracheal deviation present. No thyromegaly present.  Cardiovascular: Normal rate, regular rhythm, normal heart sounds and intact distal pulses.  Exam reveals no gallop and no friction rub.   No murmur heard. Pulmonary/Chest: Effort normal and breath sounds normal. No respiratory distress. She has no wheezes. She has no rales. She exhibits no tenderness.  Musculoskeletal: Normal range of motion. She exhibits no edema or tenderness.  Lymphadenopathy:    She has no cervical adenopathy.  Neurological: She is alert and oriented to person, place, and time. No cranial nerve deficit. She exhibits normal muscle tone. Coordination normal.  Skin: Skin is warm and dry. No rash noted. She is not diaphoretic. No erythema. No pallor.  Psychiatric: She has a normal mood and affect. Her behavior is normal. Judgment and thought content normal.          Assessment & Plan:   Problem List Items Addressed This Visit      Unprioritized   B12 deficiency    Recheck CBC and B12 with labs.      Relevant Orders   CBC with Differential/Platelet   B12   Diabetes mellitus type 2 with complications    Will check A1c with labs today. Continue Metformin and Glipizide.      Relevant Orders   Hemoglobin A1c   Lipid panel   Microalbumin / creatinine urine ratio   Essential hypertension, benign    BP Readings from Last 3 Encounters:  12/02/14 132/80  11/16/14 121/77  07/09/14 102/66  BP well controlled. Renal function with labs today. Continue Lisinopril      Relevant Orders   Comprehensive metabolic panel   Insomnia - Primary    Symptoms improved somewhat on Trazodone. Discussed good sleep hygiene including avoiding caffeine before bedtime. Follow up prn.          Return in about 3 months (around 03/04/2015) for Recheck of Diabetes.

## 2014-12-02 NOTE — Assessment & Plan Note (Signed)
Recheck CBC and B12 with labs.

## 2014-12-02 NOTE — Patient Instructions (Signed)
Labs today.  Follow up in 3 months or sooner as needed. 

## 2014-12-30 DIAGNOSIS — E538 Deficiency of other specified B group vitamins: Secondary | ICD-10-CM | POA: Diagnosis not present

## 2015-01-06 ENCOUNTER — Other Ambulatory Visit: Payer: Self-pay | Admitting: Internal Medicine

## 2015-01-10 ENCOUNTER — Ambulatory Visit (INDEPENDENT_AMBULATORY_CARE_PROVIDER_SITE_OTHER): Payer: Medicare Other | Admitting: Internal Medicine

## 2015-01-10 ENCOUNTER — Encounter: Payer: Self-pay | Admitting: Internal Medicine

## 2015-01-10 VITALS — BP 119/73 | HR 80 | Temp 98.2°F | Ht 63.0 in | Wt 205.5 lb

## 2015-01-10 DIAGNOSIS — J209 Acute bronchitis, unspecified: Secondary | ICD-10-CM | POA: Diagnosis not present

## 2015-01-10 MED ORDER — HYDROCOD POLST-CPM POLST ER 10-8 MG/5ML PO SUER: 5.0000 mL | Freq: Two times a day (BID) | ORAL | Status: DC | PRN

## 2015-01-10 MED ORDER — AZITHROMYCIN 250 MG PO TABS: ORAL_TABLET | ORAL | Status: DC

## 2015-01-10 NOTE — Progress Notes (Signed)
Pre visit review using our clinic review tool, if applicable. No additional management support is needed unless otherwise documented below in the visit note. 

## 2015-01-10 NOTE — Assessment & Plan Note (Signed)
Symptoms and exam most consistent with early acute bronchitis. Will start Azithromycin. Start Tussionex as needed for cough. Discussed that this medication may make her drowsy. Follow up if symptoms are not improving over next few days.

## 2015-01-10 NOTE — Patient Instructions (Addendum)
Start Azithromycin to help clear respiratory infection.  Use Hydrocodone twice daily as needed for cough.  Follow up in 4 weeks.

## 2015-01-10 NOTE — Progress Notes (Signed)
   Subjective:    Patient ID: Brenda [name], female    DOB: 09-27-1930, 81 y.o.   MRN: [id number]  HPI  81YO female presents for acute visit.  Last week, felt poorly with sinus congestion, cough. This week, continues to cough and cough is productive at times. No dyspnea. No chest pain. Taking some Tylenol but no other medications. No known sick contacts. No fever.  Past medical, surgical, family and social history per today's encounter.  Review of Systems  Constitutional: Positive for fatigue. Negative for fever, chills and unexpected weight change.  HENT: Negative for congestion, ear discharge, ear pain, facial swelling, hearing loss, mouth sores, nosebleeds, postnasal drip, rhinorrhea, sinus pressure, sneezing, sore throat, tinnitus, trouble swallowing and voice change.   Eyes: Negative for pain, discharge, redness and visual disturbance.  Respiratory: Positive for cough. Negative for chest tightness, shortness of breath, wheezing and stridor.   Cardiovascular: Negative for chest pain, palpitations and leg swelling.  Musculoskeletal: Negative for myalgias, arthralgias, neck pain and neck stiffness.  Skin: Negative for color change and rash.  Neurological: Negative for dizziness, weakness, light-headedness and headaches.  Hematological: Negative for adenopathy.       Objective:    BP 119/73 mmHg  Pulse 80  Temp(Src) 98.2 F (36.8 C) (Oral)  Ht 5\' 3"  (1.6 m)  Wt 205 lb 8 oz (93.214 kg)  BMI 36.41 kg/m2  SpO2 94% Physical Exam  Constitutional: She is oriented to person, place, and time. She appears well-developed and well-nourished. No distress.  HENT:  Head: Normocephalic and atraumatic.  Right Ear: External ear normal.  Left Ear: External ear normal.  Nose: Nose normal.  Mouth/Throat: Oropharynx is clear and moist. No oropharyngeal exudate.  Eyes: Conjunctivae are normal. Pupils are equal, round, and reactive to light. Right eye exhibits no discharge. Left eye exhibits no  discharge. No scleral icterus.  Neck: Normal range of motion. Neck supple. No tracheal deviation present. No thyromegaly present.  Cardiovascular: Normal rate, regular rhythm, normal heart sounds and intact distal pulses.  Exam reveals no gallop and no friction rub.   No murmur heard. Pulmonary/Chest: Effort normal. No accessory muscle usage. No tachypnea. No respiratory distress. She has no decreased breath sounds. She has no wheezes. She has rhonchi (scattered). She has no rales. She exhibits no tenderness.  Musculoskeletal: Normal range of motion. She exhibits no edema or tenderness.  Lymphadenopathy:    She has no cervical adenopathy.  Neurological: She is alert and oriented to person, place, and time. No cranial nerve deficit. She exhibits normal muscle tone. Coordination normal.  Skin: Skin is warm and dry. No rash noted. She is not diaphoretic. No erythema. No pallor.  Psychiatric: She has a normal mood and affect. Her behavior is normal. Judgment and thought content normal.          Assessment & Plan:   Problem List Items Addressed This Visit      Unprioritized   Acute bronchitis - Primary    Symptoms and exam most consistent with early acute bronchitis. Will start Azithromycin. Start Tussionex as needed for cough. Discussed that this medication may make her drowsy. Follow up if symptoms are not improving over next few days.      Relevant Medications   azithromycin (ZITHROMAX Z-PAK) 250 MG tablet   chlorpheniramine-HYDROcodone (TUSSIONEX PENNKINETIC ER) 10-8 MG/5ML SUER       Return in about 4 weeks (around 02/07/2015) for Recheck.

## 2015-01-16 ENCOUNTER — Ambulatory Visit
Admission: EM | Admit: 2015-01-16 | Discharge: 2015-01-16 | Disposition: A | Discharge status: Home / Self Care | Payer: Medicare Other | Attending: Internal Medicine | Admitting: Internal Medicine

## 2015-01-16 DIAGNOSIS — N390 Urinary tract infection, site not specified: Secondary | ICD-10-CM | POA: Diagnosis not present

## 2015-01-16 DIAGNOSIS — N3090 Cystitis, unspecified without hematuria: Secondary | ICD-10-CM | POA: Diagnosis present

## 2015-01-16 LAB — URINALYSIS COMPLETE WITH MICROSCOPIC (ARMC ONLY)
BACTERIA UA: NONE SEEN — AB
Bilirubin Urine: NEGATIVE
Glucose, UA: NEGATIVE mg/dL
Hgb urine dipstick: NEGATIVE
Ketones, ur: NEGATIVE mg/dL
Nitrite: NEGATIVE
Protein, ur: NEGATIVE mg/dL
RBC / HPF: NONE SEEN RBC/hpf (ref <3)
Specific Gravity, Urine: 1.02 (ref 1.005–1.030)
pH: 5 (ref 5.0–8.0)

## 2015-01-16 MED ORDER — PHENAZOPYRIDINE HCL 200 MG PO TABS: 200.0000 mg | ORAL_TABLET | Freq: Three times a day (TID) | ORAL | Status: DC

## 2015-01-16 MED ORDER — NITROFURANTOIN MONOHYD MACRO 100 MG PO CAPS: 100.0000 mg | ORAL_CAPSULE | Freq: Two times a day (BID) | ORAL | Status: DC

## 2015-01-16 NOTE — ED Notes (Signed)
Started yesterday with voiding only x 1. When went to bed, states "I peed all night long". Denies any burning or pain. Still no c/o burning. Hx of UTI's

## 2015-01-16 NOTE — Discharge Instructions (Signed)

## 2015-01-16 NOTE — ED Provider Notes (Addendum)
CSN: [id number]     Arrival date & time 01/16/15  6606 History   None    Chief Complaint  Patient presents with  . Cystitis   (Consider location/radiation/quality/duration/timing/severity/associated sxs/prior Treatment) The history is provided by the patient.   81y/o caucasian female with new onset last night frequent urination all night, wears diapers and couldn't make it in time to bathroom if urge hit.  Urine a little darker yellow.  Denied fever, chills, headache, foul smell to urine.  History chronic back pain not worsening.   Past Medical History  Diagnosis Date  . Arthritis   . Blood in stool   . Cancer   . Chicken pox   . Ulcer   . Hypertension   . Urinary incontinence   . Ovarian cancer 1991    s/p hysterectomy at [facility]  . Diabetes mellitus 2000  . Pernicious anemia   . GERD (gastroesophageal reflux disease)   . Hiatal hernia   . Kidney infection 2014  . Spinal stenosis    Past Surgical History  Procedure Laterality Date  . Shoulder surgery  2013    fracture right shoulder  . Foot surgery  2000  . Abdominal hysterectomy     Family History  Problem Relation Age of Onset  . Arthritis Mother   . Cancer Mother     colon  . Arthritis Father   . Cancer Father     colon  . Cancer Other     lung  . Cancer Sister     lung   History  Substance Use Topics  . Smoking status: Never Smoker   . Smokeless tobacco: Not on file  . Alcohol Use: No   OB History    No data available     Review of Systems  Constitutional: Negative.   HENT: Positive for rhinorrhea.   Eyes: Negative.   Respiratory: Positive for cough.   Cardiovascular: Negative.   Gastrointestinal: Negative for nausea, abdominal pain, diarrhea, constipation, blood in stool and abdominal distention.  Genitourinary: Positive for dysuria, urgency and frequency.  Musculoskeletal: Positive for back pain.  Skin: Negative.   Allergic/Immunologic: Negative.   Neurological: Negative.   Hematological:  Negative.   Psychiatric/Behavioral: Negative.     Allergies  Aspirin and Sulfa antibiotics  Home Medications   Prior to Admission medications   Medication Sig Start Date End Date Taking? Authorizing Provider  ferrous sulfate 324 (65 FE) MG TBEC Take 1 tablet by mouth daily.    Yes Historical Provider, MD  fesoterodine (TOVIAZ) 8 MG TB24 Take 4 mg by mouth daily.    Yes Historical Provider, MD  glipiZIDE (GLUCOTROL XL) 10 MG 24 hr tablet TAKE 1 TABLET BY MOUTH TWICE DAILY 09/24/14  Yes Jackolyn Confer, MD  lisinopril (PRINIVIL,ZESTRIL) 10 MG tablet TAKE 1 TABLET BY MOUTH EVERY DAY 01/06/15  Yes Jackolyn Confer, MD  metFORMIN (GLUCOPHAGE) 1000 MG tablet TAKE 1 TABLET BY MOUTH TWICE DAILY WITH A MEAL 10/08/14  Yes Jackolyn Confer, MD  traZODone (DESYREL) 50 MG tablet Take 0.5-1 tablets (25-50 mg total) by mouth at bedtime as needed for sleep. 11/16/14  Yes Jackolyn Confer, MD  VITAMIN B1-B12 IJ Inject 1,000 mcg as directed every 30 (thirty) days.   Yes Historical Provider, MD  azithromycin (ZITHROMAX Z-PAK) 250 MG tablet Take 2 pills day 1, then 1 pill daily days 2-5 01/10/15   Jackolyn Confer, MD  chlorpheniramine-HYDROcodone [facility] PENNKINETIC ER) 10-8 MG/5ML SUER Take 5 mLs by mouth  every 12 (twelve) hours as needed for cough. 01/10/15   Jackolyn Confer, MD  fluticasone Asencion Islam) 50 MCG/ACT nasal spray  11/08/14   Historical Provider, MD  gabapentin (NEURONTIN) 100 MG capsule Take 1 capsule (100 mg total) by mouth as needed. Take one capsule by mouth every night at bedtime. 10/26/14   Jackolyn Confer, MD  nitrofurantoin, macrocrystal-monohydrate, (MACROBID) 100 MG capsule Take 1 capsule (100 mg total) by mouth 2 (two) times daily. 01/16/15   Olen Cordial, NP  pantoprazole (PROTONIX) 40 MG tablet Take 40 mg by mouth 2 (two) times daily.    Historical Provider, MD  phenazopyridine (PYRIDIUM) 200 MG tablet Take 1 tablet (200 mg total) by mouth 3 (three) times daily. 01/16/15   Olen Cordial, NP  sucralfate (CARAFATE) 1 G tablet Take 1 g by mouth 2 (two) times daily.    Historical Provider, MD   BP 141/77 mmHg  Pulse 90  Temp(Src) 96.5 F (35.8 C) (Tympanic)  Resp 16  Ht 5\' 5"  (1.651 m)  Wt 205 lb (92.987 kg)  BMI 34.11 kg/m2  SpO2 98% Physical Exam  Constitutional: She is oriented to person, place, and time. Vital signs are normal. She appears well-developed and well-nourished.  HENT:  Head: Normocephalic and atraumatic.  Right Ear: External ear normal.  Left Ear: External ear normal.  Mouth/Throat: Oropharynx is clear and moist. No oropharyngeal exudate.  Cobblestoning posterior pharynx; bilateral TMs with air fluid level clear  Eyes: Conjunctivae, EOM and lids are normal. Pupils are equal, round, and reactive to light.  Neck: Trachea normal and normal range of motion. Neck supple. No JVD present. No tracheal deviation present. No thyromegaly present.  Cardiovascular: Normal rate, regular rhythm, normal heart sounds and intact distal pulses.  Exam reveals no gallop and no friction rub.   No murmur heard. Pulmonary/Chest: Effort normal and breath sounds normal. No respiratory distress. She has no wheezes. She has no rales. She exhibits no tenderness.  Abdominal: Soft. Bowel sounds are normal. She exhibits no distension and no mass. There is no tenderness. There is no rebound and no guarding.  Dull to percussion x 4 quads; normoactive bowel sounds x 4 quads  Musculoskeletal: Normal range of motion. She exhibits no edema or tenderness.  Lymphadenopathy:    She has no cervical adenopathy.  Neurological: She is alert and oriented to person, place, and time.  Skin: Skin is warm, dry and intact. No rash noted. No erythema. No pallor.  Psychiatric: She has a normal mood and affect. Her speech is normal and behavior is normal. Judgment and thought content normal. Cognition and memory are normal.    ED Course  Procedures (including critical care time) Labs  Review Labs Reviewed  URINALYSIS COMPLETEWITH MICROSCOPIC [facility])  - Abnormal; Notable for the following:    Leukocytes, UA TRACE (*)    Bacteria, UA NONE SEEN (*)    Squamous Epithelial / LPF 0-5 (*)    All other components within normal limits  URINE CULTURE    Imaging Review No results found.  Had UTI 08 Nov 2014 treated with ceftin.  Patient given copy of urinalysis results and discussed them with her.  Notified urine culture results will take 48 hours and will call her with results once available.  Discussed pyridium will cause urine color to change to orange.  Will change to macrobid 100mg  po BID this course.  serum creatinine and BUN normal labs Mar 2016.  Medications as directed.  Patient is  also to push fluids and may use Pyridium 100mg  po TID as needed. Call or return to clinic/see PCM as needed if these symptoms worsen or fail to improve as anticipated.  Exitcare handout on cystitis given to patient Patient verbalized agreement and understanding of treatment plan and had no further questions at this time. P2:  Hydrate and cranberry juice  MDM   1. UTI (lower urinary tract infection)    19 Jan 2015 at 0748 patient contacted via telephone and notified urine culture was contaminated.  Patient reported her symptoms are improving, able to make it to bathroom with urge no incontinence denied fever, chills, back pain.  Instructed patient to follow up with [place] or PCM if symptoms do not resolve 100% for follow up evaluation.  Patient verbalized understanding of information/instructions, agreed with plan of care and had no further questions at this time.    Olen Cordial, NP 01/16/15 [place], NP 01/16/15 [place], NP 01/19/15 [phone]

## 2015-01-18 LAB — URINE CULTURE

## 2015-01-19 ENCOUNTER — Ambulatory Visit: Payer: Medicare Other

## 2015-02-15 ENCOUNTER — Ambulatory Visit: Payer: Medicare Other | Admitting: Internal Medicine

## 2015-02-16 ENCOUNTER — Encounter: Payer: Self-pay | Admitting: Internal Medicine

## 2015-02-16 ENCOUNTER — Ambulatory Visit (INDEPENDENT_AMBULATORY_CARE_PROVIDER_SITE_OTHER): Payer: Medicare Other | Admitting: Internal Medicine

## 2015-02-16 VITALS — BP 137/78 | HR 65 | Temp 97.9°F | Ht 63.0 in | Wt 205.2 lb

## 2015-02-16 DIAGNOSIS — E118 Type 2 diabetes mellitus with unspecified complications: Secondary | ICD-10-CM

## 2015-02-16 DIAGNOSIS — Z0000 Encounter for general adult medical examination without abnormal findings: Secondary | ICD-10-CM | POA: Diagnosis not present

## 2015-02-16 DIAGNOSIS — E538 Deficiency of other specified B group vitamins: Secondary | ICD-10-CM

## 2015-02-16 DIAGNOSIS — I10 Essential (primary) hypertension: Secondary | ICD-10-CM

## 2015-02-16 LAB — LIPID PANEL
Cholesterol: 175 mg/dL (ref 0–200)
HDL: 63.2 mg/dL (ref >39.00)
LDL Cholesterol: 92 mg/dL (ref 0–99)
NonHDL: 111.8
TRIGLYCERIDES: 98 mg/dL (ref 0.0–149.0)
Total CHOL/HDL Ratio: 3
VLDL: 19.6 mg/dL (ref 0.0–40.0)

## 2015-02-16 LAB — CBC WITH DIFFERENTIAL/PLATELET
BASOS ABS: 0 10*3/uL (ref 0.0–0.1)
BASOS PCT: 0.8 % (ref 0.0–3.0)
Eosinophils Absolute: 0.3 10*3/uL (ref 0.0–0.7)
Eosinophils Relative: 4.8 % (ref 0.0–5.0)
HCT: 35.2 % — ABNORMAL LOW (ref 36.0–46.0)
Hemoglobin: 11.6 g/dL — ABNORMAL LOW (ref 12.0–15.0)
Lymphocytes Relative: 26.6 % (ref 12.0–46.0)
Lymphs Abs: 1.5 10*3/uL (ref 0.7–4.0)
MCHC: 33 g/dL (ref 30.0–36.0)
MCV: 102.2 fl — ABNORMAL HIGH (ref 78.0–100.0)
Monocytes Absolute: 0.3 10*3/uL (ref 0.1–1.0)
Monocytes Relative: 6.1 % (ref 3.0–12.0)
NEUTROS PCT: 61.7 % (ref 43.0–77.0)
Neutro Abs: 3.5 10*3/uL (ref 1.4–7.7)
PLATELETS: 243 10*3/uL (ref 150.0–400.0)
RBC: 3.44 Mil/uL — ABNORMAL LOW (ref 3.87–5.11)
RDW: 13 % (ref 11.5–15.5)
WBC: 5.7 10*3/uL (ref 4.0–10.5)

## 2015-02-16 LAB — HM DIABETES FOOT EXAM: HM DIABETIC FOOT EXAM: NORMAL

## 2015-02-16 LAB — MICROALBUMIN / CREATININE URINE RATIO
Creatinine,U: 111.6 mg/dL
MICROALB/CREAT RATIO: 0.8 mg/g (ref 0.0–30.0)
Microalb, Ur: 0.9 mg/dL (ref 0.0–1.9)

## 2015-02-16 LAB — COMPREHENSIVE METABOLIC PANEL
ALT: 13 U/L (ref 0–35)
AST: 17 U/L (ref 0–37)
Albumin: 3.9 g/dL (ref 3.5–5.2)
Alkaline Phosphatase: 72 U/L (ref 39–117)
BILIRUBIN TOTAL: 0.4 mg/dL (ref 0.2–1.2)
BUN: 14 mg/dL (ref 6–23)
CO2: 27 mEq/L (ref 19–32)
Calcium: 9.7 mg/dL (ref 8.4–10.5)
Chloride: 102 mEq/L (ref 96–112)
Creatinine, Ser: 0.91 mg/dL (ref 0.40–1.20)
GFR: 62.95 mL/min (ref >60.00)
Glucose, Bld: 166 mg/dL — ABNORMAL HIGH (ref 70–99)
Potassium: 4.2 mEq/L (ref 3.5–5.1)
Sodium: 135 mEq/L (ref 135–145)
Total Protein: 6.6 g/dL (ref 6.0–8.3)

## 2015-02-16 LAB — VITAMIN D 25 HYDROXY (VIT D DEFICIENCY, FRACTURES): VITD: 15.8 ng/mL — ABNORMAL LOW (ref 30.00–100.00)

## 2015-02-16 LAB — HEMOGLOBIN A1C: Hgb A1c MFr Bld: 8.1 % — ABNORMAL HIGH (ref 4.6–6.5)

## 2015-02-16 MED ORDER — CYANOCOBALAMIN 1000 MCG/ML IJ SOLN
1000.0000 ug | Freq: Once | INTRAMUSCULAR | Status: AC
  Administered 2015-02-16: 1000 ug via INTRAMUSCULAR

## 2015-02-16 NOTE — Assessment & Plan Note (Signed)
BP Readings from Last 3 Encounters:  02/16/15 137/78  01/16/15 141/77  01/10/15 119/73   BP well controlled. Renal function with labs. Continue current medications.

## 2015-02-16 NOTE — Progress Notes (Signed)
Pre visit review using our clinic review tool, if applicable. No additional management support is needed unless otherwise documented below in the visit note. 

## 2015-02-16 NOTE — Assessment & Plan Note (Signed)
Will check A1c with labs. Follow up with Dr. Eddie Dibbles later this month. Continue current medications.

## 2015-02-16 NOTE — Progress Notes (Signed)
Subjective:    Patient ID: Brenda [name], female    DOB: 1931-02-07, 81 y.o.   MRN: [id number]  HPI  The patient is here for annual Medicare wellness examination and management of other chronic and acute problems.   The risk factors are reflected in the social history.  The roster of all physicians providing medical care to patient - is listed in the Snapshot section of the chart.  Activities of daily living:  The patient is 100% independent in all ADLs: dressing, toileting, feeding as well as independent mobility. Lives alone. Husband passed away 10 years ago. No pets. Home is one story home.  Home safety : The patient does not have smoke detectors in the home. They wear seatbelts.  Has a med alert to wear at home. There are no firearms at home. There is no violence in the home.   There is no risks for hepatitis, STDs or HIV. There is a history of blood transfusion when she had bleeding ulcers. They have no travel history to infectious disease endemic areas of the world.  The patient has not seen their dentist in the last six month. Limited because of cost. (Dr. Claris Pong) They have seen their eye doctor in the last year. [facility]) She has hearing aids but does not generally wear them.  They have deferred audiologic testing in the last year.   They do not  have excessive sun exposure. Discussed the need for sun protection: hats, long sleeves and use of sunscreen if there is significant sun exposure. (Derm- Dr. Koleen Nimrod) Endocrine - Dr. Eddie Dibbles GI - Dr. Tiffany Kocher Rheumatology - Dr. Jefm Bryant  Diet: the importance of a healthy diet is discussed. They do have a healthy diet.  The benefits of regular aerobic exercise were discussed. She does not exercise. Encouraged exercise.  Depression screen: there are no signs or vegative symptoms of depression- irritability, change in appetite, anhedonia, sadness/tearfullness.   Cognitive assessment: the patient manages all their financial  and personal affairs and is actively engaged. They could relate day,date,year and events.  HCPOA - Daughter, [name]  The following portions of the patient's history were reviewed and updated as appropriate: allergies, current medications, past family history, past medical history,  past surgical history, past social history  and problem list.  Visual acuity was not assessed per patient preference since she has regular follow up with her ophthalmologist. Hearing and body mass index were assessed and reviewed.   During the course of the visit the patient was educated and counseled about appropriate screening and preventive services including : fall prevention , diabetes screening, nutrition counseling, colorectal cancer screening, and recommended immunizations.       Past medical, surgical, family and social history per today's encounter.  Review of Systems  Constitutional: Negative for fever, chills, appetite change, fatigue and unexpected weight change.  Eyes: Negative for visual disturbance.  Respiratory: Negative for shortness of breath.   Cardiovascular: Negative for chest pain and leg swelling.  Gastrointestinal: Negative for nausea, vomiting, abdominal pain, diarrhea and constipation.  Musculoskeletal: Negative for myalgias and arthralgias.  Skin: Negative for color change and rash.  Hematological: Negative for adenopathy. Does not bruise/bleed easily.  Psychiatric/Behavioral: Negative for suicidal ideas, sleep disturbance and dysphoric mood. The patient is not nervous/anxious.        Objective:    BP 137/78 mmHg  Pulse 65  Temp(Src) 97.9 F (36.6 C) (Oral)  Ht 5\' 3"  (1.6 m)  Wt 205 lb 4 oz (  93.101 kg)  BMI 36.37 kg/m2  SpO2 96% Physical Exam  Constitutional: She is oriented to person, place, and time. She appears well-developed and well-nourished. No distress.  HENT:  Head: Normocephalic and atraumatic.  Right Ear: External ear normal.  Left Ear: External ear  normal.  Nose: Nose normal.  Mouth/Throat: Oropharynx is clear and moist. No oropharyngeal exudate.  Eyes: Conjunctivae are normal. Pupils are equal, round, and reactive to light. Right eye exhibits no discharge. Left eye exhibits no discharge. No scleral icterus.  Neck: Normal range of motion. Neck supple. No tracheal deviation present. No thyromegaly present.  Cardiovascular: Normal rate, regular rhythm, normal heart sounds and intact distal pulses.  Exam reveals no gallop and no friction rub.   No murmur heard. Pulmonary/Chest: Effort normal and breath sounds normal. No accessory muscle usage. No tachypnea. No respiratory distress. She has no decreased breath sounds. She has no wheezes. She has no rales. She exhibits no tenderness. Right breast exhibits no inverted nipple, no mass, no nipple discharge, no skin change and no tenderness. Left breast exhibits no inverted nipple, no mass, no nipple discharge, no skin change and no tenderness. Breasts are symmetrical.  Abdominal: Soft. Bowel sounds are normal. She exhibits no distension and no mass. There is no tenderness. There is no rebound and no guarding.  Musculoskeletal: Normal range of motion. She exhibits no edema or tenderness.  Lymphadenopathy:    She has no cervical adenopathy.  Neurological: She is alert and oriented to person, place, and time. No cranial nerve deficit. She exhibits normal muscle tone. Coordination normal.  Skin: Skin is warm and dry. No rash noted. She is not diaphoretic. No erythema. No pallor.  Psychiatric: She has a normal mood and affect. Her behavior is normal. Judgment and thought content normal.          Assessment & Plan:   Problem List Items Addressed This Visit      Unprioritized   Diabetes mellitus type 2 with complications    Will check A1c with labs. Follow up with Dr. Eddie Dibbles later this month. Continue current medications.      Relevant Orders   Comprehensive metabolic panel   Hemoglobin A1c    Essential hypertension, benign    BP Readings from Last 3 Encounters:  02/16/15 137/78  01/16/15 141/77  01/10/15 119/73   BP well controlled. Renal function with labs. Continue current medications.      Medicare annual wellness visit, subsequent - Primary    General medical exam including breast exam normal today. Mammogram UTD and reviewed. Colonoscopy UTD. Encouraged her to get her eye exam. Labs as ordered. Encouraged healthy diet and exercise. Immunizations are UTD.  Patient was given a handout regarding current recommendations for health maintenance and preventative care on the AVS.        Relevant Orders   CBC with Differential/Platelet   Lipid panel   Microalbumin / creatinine urine ratio   Vit D  25 hydroxy (rtn osteoporosis monitoring)       Return in about 6 months (around 08/18/2015) for Recheck of Diabetes.

## 2015-02-16 NOTE — Patient Instructions (Signed)

## 2015-02-16 NOTE — Addendum Note (Signed)
Addended by: Vernetta Honey on: 02/16/2015 10:23 AM   Modules accepted: Orders

## 2015-02-16 NOTE — Assessment & Plan Note (Signed)
General medical exam including breast exam normal today. Mammogram UTD and reviewed. Colonoscopy UTD. Encouraged her to get her eye exam. Labs as ordered. Encouraged healthy diet and exercise. Immunizations are UTD.  Patient was given a handout regarding current recommendations for health maintenance and preventative care on the AVS.

## 2015-02-21 ENCOUNTER — Ambulatory Visit
Admission: EM | Admit: 2015-02-21 | Discharge: 2015-02-21 | Disposition: A | Discharge status: Home / Self Care | Payer: Medicare Other | Attending: Internal Medicine | Admitting: Internal Medicine

## 2015-02-21 DIAGNOSIS — Z8543 Personal history of malignant neoplasm of ovary: Secondary | ICD-10-CM | POA: Insufficient documentation

## 2015-02-21 DIAGNOSIS — K219 Gastro-esophageal reflux disease without esophagitis: Secondary | ICD-10-CM | POA: Insufficient documentation

## 2015-02-21 DIAGNOSIS — K449 Diaphragmatic hernia without obstruction or gangrene: Secondary | ICD-10-CM | POA: Diagnosis not present

## 2015-02-21 DIAGNOSIS — I10 Essential (primary) hypertension: Secondary | ICD-10-CM | POA: Diagnosis not present

## 2015-02-21 DIAGNOSIS — R531 Weakness: Secondary | ICD-10-CM | POA: Diagnosis not present

## 2015-02-21 DIAGNOSIS — M2550 Pain in unspecified joint: Secondary | ICD-10-CM | POA: Diagnosis not present

## 2015-02-21 DIAGNOSIS — E119 Type 2 diabetes mellitus without complications: Secondary | ICD-10-CM | POA: Diagnosis not present

## 2015-02-21 DIAGNOSIS — D510 Vitamin B12 deficiency anemia due to intrinsic factor deficiency: Secondary | ICD-10-CM | POA: Diagnosis not present

## 2015-02-21 LAB — URINALYSIS COMPLETE WITH MICROSCOPIC (ARMC ONLY)
BACTERIA UA: NONE SEEN — AB
Bilirubin Urine: NEGATIVE
GLUCOSE, UA: NEGATIVE mg/dL
Hgb urine dipstick: NEGATIVE
NITRITE: NEGATIVE
PROTEIN: NEGATIVE mg/dL
RBC / HPF: NONE SEEN RBC/hpf (ref <3)
Specific Gravity, Urine: 1.025 (ref 1.005–1.030)
pH: 5 (ref 5.0–8.0)

## 2015-02-21 NOTE — Discharge Instructions (Signed)
Fatigue Fatigue is a feeling of tiredness, lack of energy, lack of motivation, or feeling tired all the time. Having enough rest, good nutrition, and reducing stress will normally reduce fatigue. Consult your caregiver if it persists. The nature of your fatigue will help your caregiver to find out its cause. The treatment is based on the cause.  CAUSES  There are many causes for fatigue. Most of the time, fatigue can be traced to one or more of your habits or routines. Most causes fit into one or more of three general areas. They are: Lifestyle problems  Sleep disturbances.  Overwork.  Physical exertion.  Unhealthy habits.  Poor eating habits or eating disorders.  Alcohol and/or drug use .  Lack of proper nutrition (malnutrition). Psychological problems  Stress and/or anxiety problems.  Depression.  Grief.  Boredom. Medical Problems or Conditions  Anemia.  Pregnancy.  Thyroid gland problems.  Recovery from major surgery.  Continuous pain.  Emphysema or asthma that is not well controlled  Allergic conditions.  Diabetes.  Infections (such as mononucleosis).  Obesity.  Sleep disorders, such as sleep apnea.  Heart failure or other heart-related problems.  Cancer.  Kidney disease.  Liver disease.  Effects of certain medicines such as antihistamines, cough and cold remedies, prescription pain medicines, heart and blood pressure medicines, drugs used for treatment of cancer, and some antidepressants. SYMPTOMS  The symptoms of fatigue include:   Lack of energy.  Lack of drive (motivation).  Drowsiness.  Feeling of indifference to the surroundings. DIAGNOSIS  The details of how you feel help guide your caregiver in finding out what is causing the fatigue. You will be asked about your present and past health condition. It is important to review all medicines that you take, including prescription and non-prescription items. A thorough exam will be done.  You will be questioned about your feelings, habits, and normal lifestyle. Your caregiver may suggest blood tests, urine tests, or other tests to look for common medical causes of fatigue.  TREATMENT  Fatigue is treated by correcting the underlying cause. For example, if you have continuous pain or depression, treating these causes will improve how you feel. Similarly, adjusting the dose of certain medicines will help in reducing fatigue.  HOME CARE INSTRUCTIONS   Try to get the required amount of good sleep every night.  Eat a healthy and nutritious diet, and drink enough water throughout the day.  Practice ways of relaxing (including yoga or meditation).  Exercise regularly.  Make plans to change situations that cause stress. Act on those plans so that stresses decrease over time. Keep your work and personal routine reasonable.  Avoid street drugs and minimize use of alcohol.  Start taking a daily multivitamin after consulting your caregiver. SEEK MEDICAL CARE IF:   You have persistent tiredness, which cannot be accounted for.  You have fever.  You have unintentional weight loss.  You have headaches.  You have disturbed sleep throughout the night.  You are feeling sad.  You have constipation.  You have dry skin.  You have gained weight.  You are taking any new or different medicines that you suspect are causing fatigue.  You are unable to sleep at night.  You develop any unusual swelling of your legs or other parts of your body. SEEK IMMEDIATE MEDICAL CARE IF:   You are feeling confused.  Your vision is blurred.  You feel faint or pass out.  You develop severe headache.  You develop severe abdominal, pelvic, or   back pain.  You develop chest pain, shortness of breath, or an irregular or fast heartbeat.  You are unable to pass a normal amount of urine.  You develop abnormal bleeding such as bleeding from the rectum or you vomit blood.  You have thoughts  about harming yourself or committing suicide.  You are worried that you might harm someone else. MAKE SURE YOU:   Understand these instructions.  Will watch your condition.  Will get help right away if you are not doing well or get worse. Document Released: 06/24/2007 Document Revised: 11/19/2011 Document Reviewed: 12/29/2013 Exit[employer] Patient Information 2015 [employer]. This information is not intended to replace advice given to you by your health care provider. Make sure you discuss any questions you have with your health care provider.  Lyme Disease You may have been bitten by a tick and are to watch for the development of Lyme Disease. Lyme Disease is an infection that is caused by a bacteria The bacteria causing this disease is named Borreilia burgdorferi. If a tick is infected with this bacteria and then bites you, then Lyme Disease may occur. These ticks are carried by deer and rodents such as rabbits and mice and infest grassy as well as forested areas. Fortunately most tick bites do not cause Lyme Disease.  Lyme Disease is easier to prevent than to treat. First, covering your legs with clothing when walking in areas where ticks are possibly abundant will prevent their attachment because ticks tend to stay within inches of the ground. Second, using insecticides containing DEET can be applied on skin or clothing. Last, because it takes about 12 to 24 hours for the tick to transmit the disease after attachment to the human host, you should inspect your body for ticks twice a day when you are in areas where Lyme Disease is common. You must look thoroughly when searching for ticks. The Ixodes tick that carries Lyme Disease is very small. It is around the size of a sesame seed (picture of tick is not actual size). Removal is best done by grasping the tick by the head and pulling it out. Do not to squeeze the body of the tick. This could inject the infecting bacteria into the bite site. Wash  the area of the bite with an antiseptic solution after removal.  Lyme Disease is a disease that may affect many body systems. Because of the small size of the biting tick, most people do not notice being bitten. The first sign of an infection is usually a round red rash that extends out from the center of the tick bite. The center of the lesion may be blood colored (hemorrhagic) or have tiny blisters (vesicular). Most lesions have bright red outer borders and partial central clearing. This rash may extend out many inches in diameter, and multiple lesions may be present. Other symptoms such as fatigue, headaches, chills and fever, general achiness and swelling of lymph glands may also occur. If this first stage of the disease is left untreated, these symptoms may gradually resolve by themselves, or progressive symptoms may occur because of spread of infection to other areas of the body.  Follow up with your caregiver to have testing and treatment if you have a tick bite and you develop any of the above complaints. Your caregiver may recommend preventative (prophylactic) medications which kill bacteria (antibiotics). Once a diagnosis of Lyme Disease is made, antibiotic treatment is highly likely to cure the disease. Effective treatment of late stage Lyme Disease   may require longer courses of antibiotic therapy.  MAKE SURE YOU:   Understand these instructions.  Will watch your condition.  Will get help right away if you are not doing well or get worse. Document Released: 12/03/2000 Document Revised: 11/19/2011 Document Reviewed: 02/04/2009 Exit[employer] Patient Information 2015 [employer]. This information is not intended to replace advice given to you by your health care provider. Make sure you discuss any questions you have with your health care provider.  

## 2015-02-21 NOTE — ED Notes (Signed)
Pt states "I have been sick all winter, getting weaker, and bad joint pain. I was bitten by a tick last summer and now I think I might Lyme's disease."

## 2015-02-21 NOTE — ED Provider Notes (Signed)
CSN: [id number]     Arrival date & time 02/21/15  0914 History   First MD Initiated Contact with Patient 02/21/15 1000     Chief Complaint  Patient presents with  . Weakness   (Consider location/radiation/quality/duration/timing/severity/associated sxs/prior Treatment) HPI Comments: Elderly caucasian female with worsening back/joint pain was talking with ladies at church yesterday and they asked her if she recently had tick bite.  She stated here for lyme and tick rash testing as unable to talk with PCM this am.  Ellyn Hack time going from sitting to standing, weak.  Saw PCM last week but didn't remember to tell PCM about tick bite with red ring around it for a couple weeks last fall.  Patient is a 81 y.o. female presenting with weakness. The history is provided by the patient.  Weakness This is a chronic problem. The current episode started more than 1 week ago. The problem occurs constantly. The problem has been gradually worsening. Pertinent negatives include no chest pain, no abdominal pain, no headaches and no shortness of breath. The symptoms are aggravated by exertion, standing and walking. Nothing relieves the symptoms. She has tried rest, food and water for the symptoms. The treatment provided no relief.    Past Medical History  Diagnosis Date  . Arthritis   . Blood in stool   . Cancer   . Chicken pox   . Ulcer   . Hypertension   . Urinary incontinence   . Ovarian cancer 1991    s/p hysterectomy at [facility]  . Diabetes mellitus 2000  . Pernicious anemia   . GERD (gastroesophageal reflux disease)   . Hiatal hernia   . Kidney infection 2014  . Spinal stenosis    Past Surgical History  Procedure Laterality Date  . Shoulder surgery  2013    fracture right shoulder  . Foot surgery  2000  . Abdominal hysterectomy     Family History  Problem Relation Age of Onset  . Arthritis Mother   . Cancer Mother     colon  . Arthritis Father   . Cancer Father     colon  . Cancer Other      lung  . Cancer Sister     lung   History  Substance Use Topics  . Smoking status: Never Smoker   . Smokeless tobacco: Not on file  . Alcohol Use: No   OB History    No data available     Review of Systems  Constitutional: Positive for fatigue. Negative for fever, chills, diaphoresis, activity change and appetite change.  HENT: Negative for congestion, dental problem, drooling, ear discharge, ear pain, facial swelling, hearing loss, mouth sores, nosebleeds, postnasal drip, rhinorrhea, sinus pressure, sneezing, sore throat, tinnitus, trouble swallowing and voice change.   Eyes: Negative for photophobia, pain, discharge, redness, itching and visual disturbance.  Respiratory: Negative for apnea, cough, choking, chest tightness, shortness of breath, wheezing and stridor.   Cardiovascular: Negative for chest pain, palpitations and leg swelling.  Gastrointestinal: Negative for nausea, vomiting, abdominal pain, diarrhea, constipation, blood in stool, abdominal distention, anal bleeding and rectal pain.  Endocrine: Negative for cold intolerance and heat intolerance.  Genitourinary: Negative for dysuria and difficulty urinating.  Musculoskeletal: Positive for myalgias, back pain and arthralgias. Negative for joint swelling, gait problem, neck pain and neck stiffness.  Skin: Negative for color change, pallor, rash and wound.  Allergic/Immunologic: Negative for environmental allergies and food allergies.  Neurological: Positive for weakness. Negative for dizziness, tremors, seizures, syncope,  facial asymmetry, speech difficulty, light-headedness, numbness and headaches.  Psychiatric/Behavioral: Negative for behavioral problems, confusion, sleep disturbance and agitation.    Allergies  Aspirin and Sulfa antibiotics  Home Medications   Prior to Admission medications   Medication Sig Start Date End Date Taking? Authorizing Provider  cholecalciferol (VITAMIN D) 1000 UNITS tablet Take 2,000  Units by mouth daily.   Yes Historical Provider, MD  ferrous sulfate 324 (65 FE) MG TBEC Take 1 tablet by mouth daily.    Yes Historical Provider, MD  gabapentin (NEURONTIN) 100 MG capsule Take 1 capsule (100 mg total) by mouth as needed. Take one capsule by mouth every night at bedtime. 10/26/14  Yes Jackolyn Confer, MD  glipiZIDE (GLUCOTROL XL) 10 MG 24 hr tablet TAKE 1 TABLET BY MOUTH TWICE DAILY 09/24/14  Yes Jackolyn Confer, MD  lisinopril (PRINIVIL,ZESTRIL) 10 MG tablet TAKE 1 TABLET BY MOUTH EVERY DAY 01/06/15  Yes Jackolyn Confer, MD  metFORMIN (GLUCOPHAGE) 1000 MG tablet TAKE 1 TABLET BY MOUTH TWICE DAILY WITH A MEAL 10/08/14  Yes Jackolyn Confer, MD  pantoprazole (PROTONIX) 40 MG tablet Take 40 mg by mouth 2 (two) times daily.   Yes Historical Provider, MD  sucralfate (CARAFATE) 1 G tablet Take 1 g by mouth 2 (two) times daily.   Yes Historical Provider, MD  traZODone (DESYREL) 50 MG tablet Take 0.5-1 tablets (25-50 mg total) by mouth at bedtime as needed for sleep. 11/16/14  Yes Jackolyn Confer, MD  VITAMIN B1-B12 IJ Inject 1,000 mcg as directed every 30 (thirty) days.   Yes Historical Provider, MD  fesoterodine (TOVIAZ) 8 MG TB24 Take 4 mg by mouth daily.     Historical Provider, MD   BP 144/71 mmHg  Pulse 80  Temp(Src) 97.6 F (36.4 C) (Tympanic)  Resp 16  Ht 5\' 5"  (1.651 m)  Wt 205 lb (92.987 kg)  BMI 34.11 kg/m2  SpO2 99% Physical Exam  Constitutional: She is oriented to person, place, and time. Vital signs are normal. She appears well-developed and well-nourished. No distress.  HENT:  Head: Normocephalic and atraumatic.  Right Ear: External ear normal.  Left Ear: External ear normal.  Nose: Nose normal.  Mouth/Throat: Oropharynx is clear and moist. No oropharyngeal exudate.  Eyes: Conjunctivae, EOM and lids are normal. Pupils are equal, round, and reactive to light. Right eye exhibits no discharge. Left eye exhibits no discharge. No scleral icterus.  Neck: Trachea  normal and normal range of motion. Neck supple. No tracheal deviation present. No thyromegaly present.  Cardiovascular: Normal rate, regular rhythm, normal heart sounds and intact distal pulses.  Exam reveals no gallop.   No murmur heard. Pulmonary/Chest: Effort normal and breath sounds normal. No stridor. No respiratory distress. She has no wheezes. She has no rales. She exhibits no tenderness.  Abdominal: Soft. Bowel sounds are normal. She exhibits no distension and no mass. There is no tenderness. There is no rebound and no guarding.  Musculoskeletal: She exhibits edema. She exhibits no tenderness.       Lumbar back: She exhibits decreased range of motion and pain. She exhibits no tenderness, no bony tenderness, no swelling, no edema, no deformity, no laceration, no spasm and normal pulse.       Left foot: There is swelling. There is normal range of motion, no tenderness, no bony tenderness, normal capillary refill, no crepitus, no deformity and no laceration.  Nonpitting edema left ankle  Lymphadenopathy:    She has no cervical adenopathy.  Neurological: She is alert and oriented to person, place, and time. She displays normal reflexes. She exhibits normal muscle tone. Coordination normal.  Skin: Skin is warm, dry and intact. No rash noted. She is not diaphoretic. No erythema. No pallor.  Psychiatric: She has a normal mood and affect. Her speech is normal and behavior is normal. Judgment and thought content normal. Cognition and memory are normal.  Nursing note and vitals reviewed.   ED Course  Procedures (including critical care time) Labs Review Labs Reviewed  URINALYSIS COMPLETEWITH MICROSCOPIC ([facility] ONLY) - Abnormal; Notable for the following:    Ketones, ur TRACE (*)    Leukocytes, UA TRACE (*)    Bacteria, UA NONE SEEN (*)    Squamous Epithelial / LPF 0-5 (*)    All other components within normal limits  URINE CULTURE  B. BURGDORFI ANTIBODIES  ROCKY MTN SPOTTED FVR ABS  PNL(IGG+IGM)    Imaging Review No results found.  1220 telephone message left for patient urinalysis looks better than previous.  Does not appear to have UTI at this time.  Urine culture results pending in 48 hours.  Patient to contact clinic if she has further questions or new symptoms.  I am here until 1400 today and will return on Wednesday 0700.  MDM   1. Arthralgia   2. Weakness generalized    [facility] Fever/Lyme testing pending.  Discussed with patient probably 4 days to get results as ship out.  Hydrate, rest, gentle range of motion. Heat packs.  Recently saw PCM HgbA1c worsened and Vitamin B12 low may be contributing to her fatigue and aches.  Discussed if tests positive will start doxycycline but did not want to start now.  Urinalysis results were pending when patient asked [place] to call her once results available and leave message if she did not answer.  Will hold on antibiotics at this time as patient has had two UTIs and treatment over this winter/spring.  Patient to continue to monitor for tick bites if bullseye rash develops to follow up with PCM or [place] for re-evaluation.  Patient verbalized understanding of information/instructions, agreed with plan of care and had no further questions at this time.    Olen Cordial, NP 02/21/15 1233

## 2015-02-22 DIAGNOSIS — E1142 Type 2 diabetes mellitus with diabetic polyneuropathy: Secondary | ICD-10-CM | POA: Diagnosis not present

## 2015-02-23 LAB — URINE CULTURE: SPECIAL REQUESTS: NORMAL

## 2015-02-23 LAB — ROCKY MTN SPOTTED FVR ABS PNL(IGG+IGM)
RMSF IgG: NEGATIVE
RMSF IgM: 0.41 index (ref 0.00–0.89)

## 2015-02-23 LAB — B. BURGDORFI ANTIBODIES

## 2015-03-01 DIAGNOSIS — E538 Deficiency of other specified B group vitamins: Secondary | ICD-10-CM | POA: Diagnosis not present

## 2015-03-01 DIAGNOSIS — E1165 Type 2 diabetes mellitus with hyperglycemia: Secondary | ICD-10-CM | POA: Diagnosis not present

## 2015-03-01 DIAGNOSIS — E1142 Type 2 diabetes mellitus with diabetic polyneuropathy: Secondary | ICD-10-CM | POA: Diagnosis not present

## 2015-03-01 DIAGNOSIS — E1129 Type 2 diabetes mellitus with other diabetic kidney complication: Secondary | ICD-10-CM | POA: Diagnosis not present

## 2015-03-22 ENCOUNTER — Ambulatory Visit (INDEPENDENT_AMBULATORY_CARE_PROVIDER_SITE_OTHER): Payer: Medicare Other

## 2015-03-22 DIAGNOSIS — E538 Deficiency of other specified B group vitamins: Secondary | ICD-10-CM | POA: Diagnosis not present

## 2015-03-22 MED ORDER — CYANOCOBALAMIN 1000 MCG/ML IJ SOLN: 1000.0000 ug | Freq: Once | INTRAMUSCULAR | Status: DC

## 2015-04-03 IMAGING — CR DG CHEST 2V
1 series · 2 of 2 positions shown · non-contrast
Comparison: 06/24/2012

CLINICAL DATA: Shortness of breath

EXAM:
CHEST  2 VIEW

[Series 1: lat · 0.17mm/px · 2 of 2 slices shown]
[im 1/2]
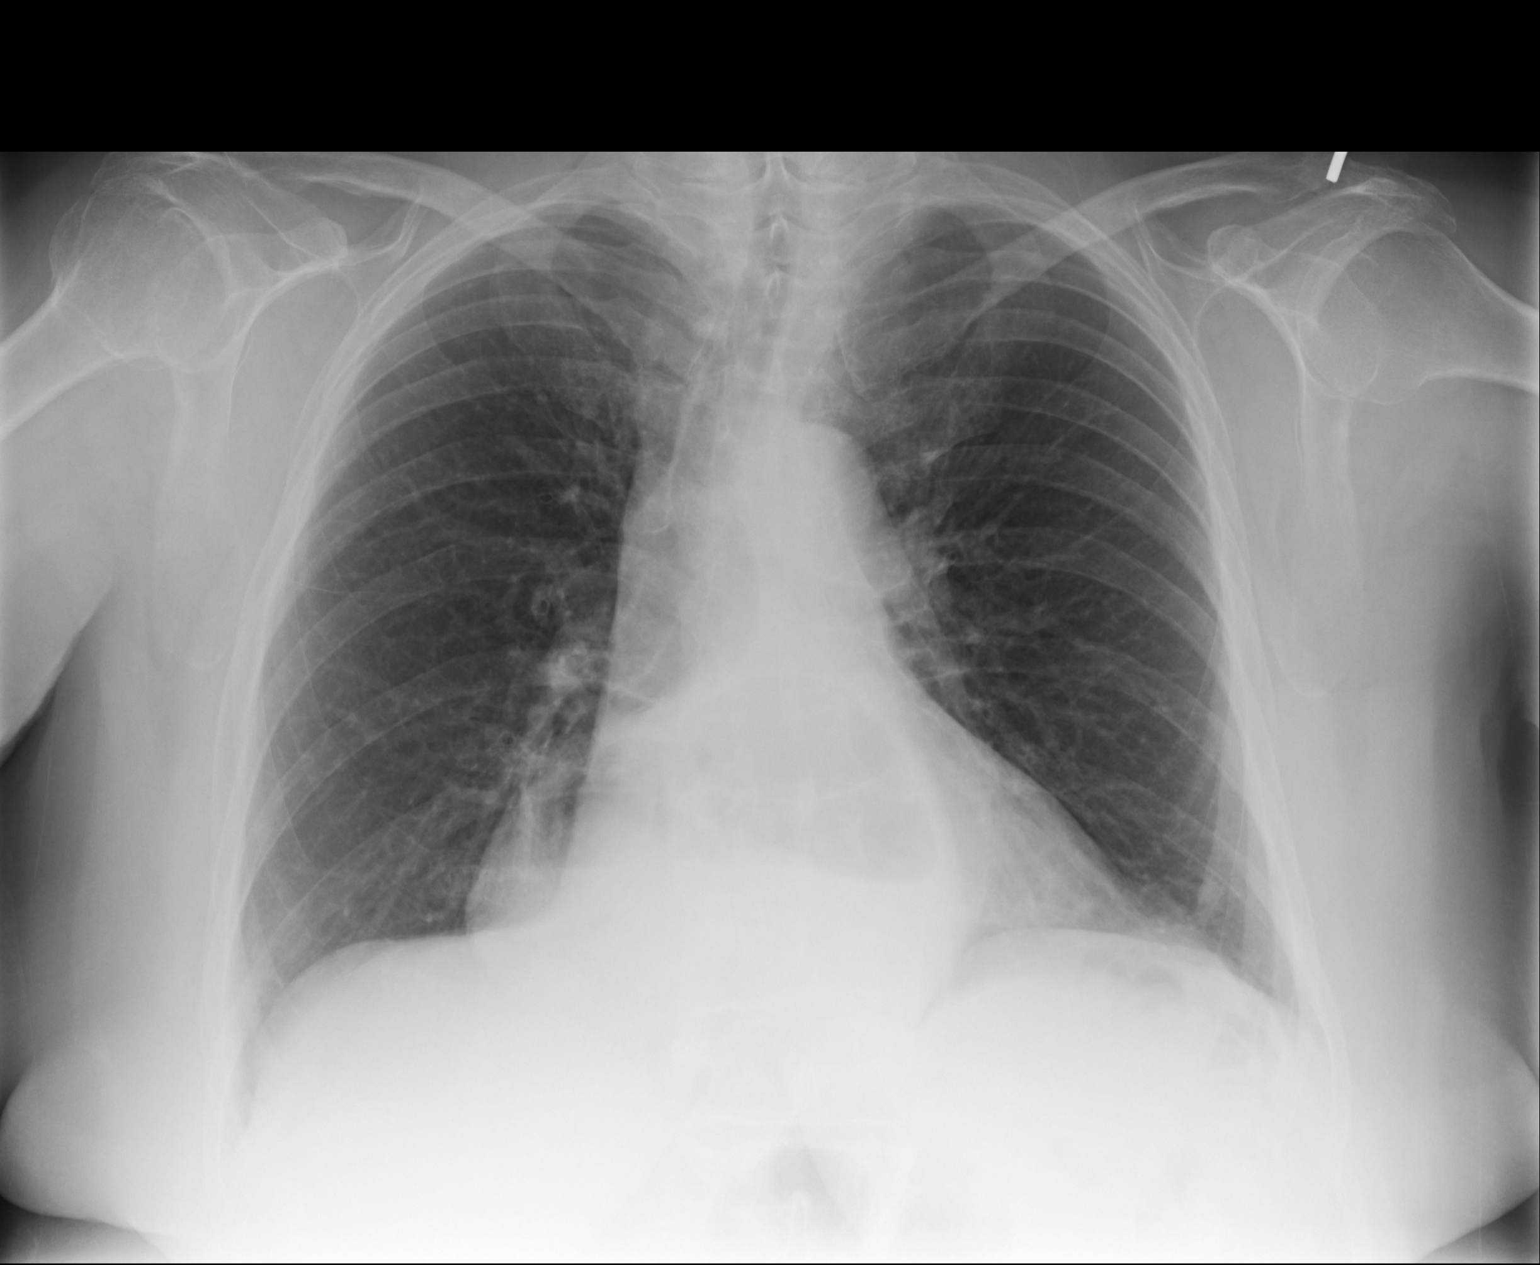
[im 2/2]
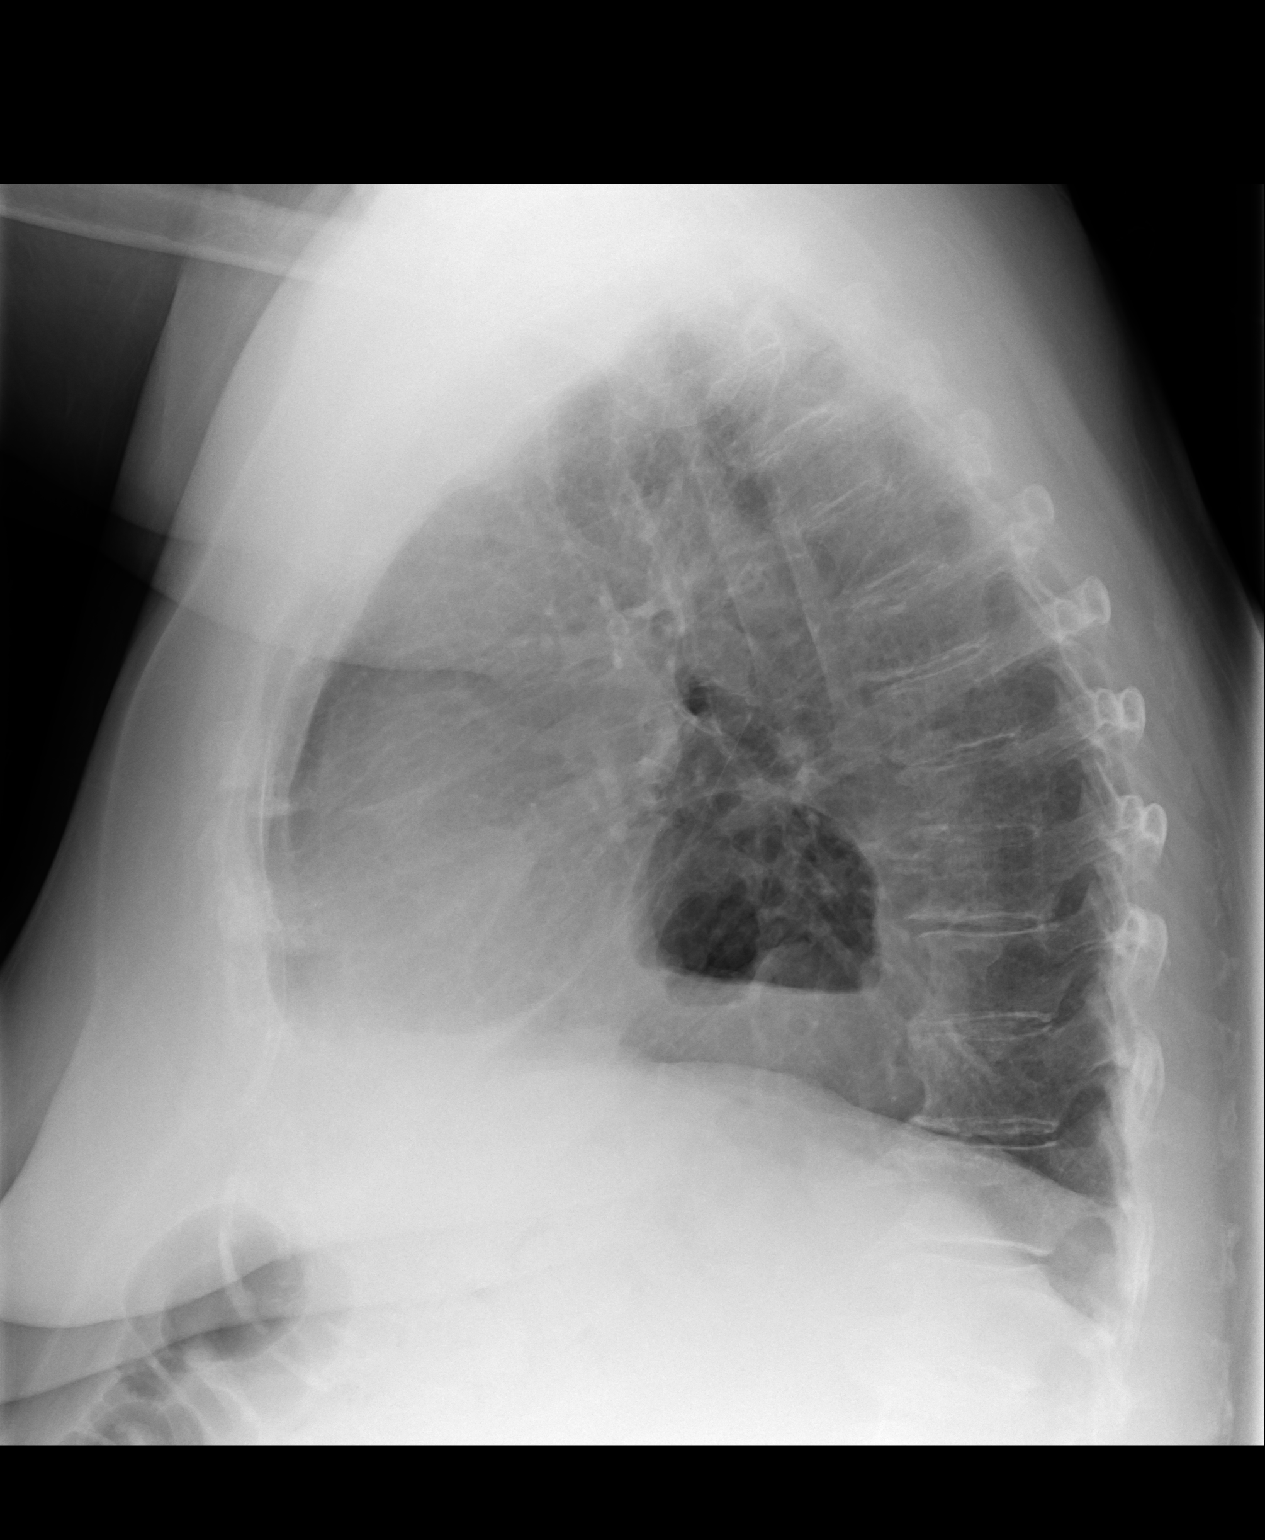

[2 of 2 positions shown; findings below may reference images not displayed]

FINDINGS: Borderline enlargement of cardiac silhouette.

Very large hiatal hernia with air-fluid level.

Tortuous thoracic aorta.

Pulmonary vascularity normal.

Lungs clear.

No pleural effusion or pneumothorax.

Bones demineralized.
IMPRESSION: Very large hiatal hernia.

Borderline enlargement of cardiac silhouette.

## 2015-05-02 ENCOUNTER — Ambulatory Visit
Admission: EM | Admit: 2015-05-02 | Discharge: 2015-05-02 | Disposition: A | Discharge status: Home / Self Care | Payer: Medicare Other | Attending: Family Medicine | Admitting: Family Medicine

## 2015-05-02 ENCOUNTER — Encounter: Payer: Self-pay | Admitting: Emergency Medicine

## 2015-05-02 DIAGNOSIS — K219 Gastro-esophageal reflux disease without esophagitis: Secondary | ICD-10-CM | POA: Diagnosis not present

## 2015-05-02 DIAGNOSIS — M2550 Pain in unspecified joint: Secondary | ICD-10-CM | POA: Diagnosis present

## 2015-05-02 DIAGNOSIS — Z90710 Acquired absence of both cervix and uterus: Secondary | ICD-10-CM | POA: Insufficient documentation

## 2015-05-02 DIAGNOSIS — L02419 Cutaneous abscess of limb, unspecified: Secondary | ICD-10-CM | POA: Diagnosis not present

## 2015-05-02 DIAGNOSIS — M1990 Unspecified osteoarthritis, unspecified site: Secondary | ICD-10-CM | POA: Insufficient documentation

## 2015-05-02 DIAGNOSIS — L02416 Cutaneous abscess of left lower limb: Secondary | ICD-10-CM | POA: Diagnosis not present

## 2015-05-02 DIAGNOSIS — R32 Unspecified urinary incontinence: Secondary | ICD-10-CM | POA: Insufficient documentation

## 2015-05-02 DIAGNOSIS — E119 Type 2 diabetes mellitus without complications: Secondary | ICD-10-CM | POA: Diagnosis not present

## 2015-05-02 DIAGNOSIS — K449 Diaphragmatic hernia without obstruction or gangrene: Secondary | ICD-10-CM | POA: Insufficient documentation

## 2015-05-02 DIAGNOSIS — Z79899 Other long term (current) drug therapy: Secondary | ICD-10-CM | POA: Insufficient documentation

## 2015-05-02 DIAGNOSIS — M7989 Other specified soft tissue disorders: Secondary | ICD-10-CM | POA: Diagnosis present

## 2015-05-02 DIAGNOSIS — Z8719 Personal history of other diseases of the digestive system: Secondary | ICD-10-CM

## 2015-05-02 DIAGNOSIS — Z8543 Personal history of malignant neoplasm of ovary: Secondary | ICD-10-CM | POA: Insufficient documentation

## 2015-05-02 DIAGNOSIS — L03119 Cellulitis of unspecified part of limb: Secondary | ICD-10-CM | POA: Diagnosis not present

## 2015-05-02 DIAGNOSIS — I10 Essential (primary) hypertension: Secondary | ICD-10-CM | POA: Diagnosis not present

## 2015-05-02 DIAGNOSIS — D649 Anemia, unspecified: Secondary | ICD-10-CM | POA: Diagnosis not present

## 2015-05-02 DIAGNOSIS — Z8711 Personal history of peptic ulcer disease: Secondary | ICD-10-CM

## 2015-05-02 DIAGNOSIS — R5383 Other fatigue: Secondary | ICD-10-CM

## 2015-05-02 LAB — CBC WITH DIFFERENTIAL/PLATELET
Basophils Absolute: 0 10*3/uL (ref 0–0.1)
Basophils Relative: 1 %
Eosinophils Absolute: 0.3 10*3/uL (ref 0–0.7)
Eosinophils Relative: 4 %
HEMATOCRIT: 33.4 % — AB (ref 35.0–47.0)
Hemoglobin: 11.3 g/dL — ABNORMAL LOW (ref 12.0–16.0)
LYMPHS PCT: 24 %
Lymphs Abs: 1.5 10*3/uL (ref 1.0–3.6)
MCH: 34.7 pg — ABNORMAL HIGH (ref 26.0–34.0)
MCHC: 33.8 g/dL (ref 32.0–36.0)
MCV: 102.8 fL — ABNORMAL HIGH (ref 80.0–100.0)
MONO ABS: 0.5 10*3/uL (ref 0.2–0.9)
MONOS PCT: 8 %
NEUTROS ABS: 4.1 10*3/uL (ref 1.4–6.5)
Neutrophils Relative %: 63 %
Platelets: 263 10*3/uL (ref 150–440)
RBC: 3.25 MIL/uL — ABNORMAL LOW (ref 3.80–5.20)
RDW: 13.3 % (ref 11.5–14.5)
WBC: 6.4 10*3/uL (ref 3.6–11.0)

## 2015-05-02 LAB — BASIC METABOLIC PANEL
ANION GAP: 10 (ref 5–15)
BUN: 19 mg/dL (ref 6–20)
CALCIUM: 8.8 mg/dL — AB (ref 8.9–10.3)
CHLORIDE: 100 mmol/L — AB (ref 101–111)
CO2: 25 mmol/L (ref 22–32)
Creatinine, Ser: 1 mg/dL (ref 0.44–1.00)
GFR calc Af Amer: 60 mL/min — ABNORMAL LOW (ref >60)
GFR calc non Af Amer: 51 mL/min — ABNORMAL LOW (ref >60)
GLUCOSE: 181 mg/dL — AB (ref 65–99)
Potassium: 4.1 mmol/L (ref 3.5–5.1)
Sodium: 135 mmol/L (ref 135–145)

## 2015-05-02 LAB — URINALYSIS COMPLETE WITH MICROSCOPIC (ARMC ONLY)
Bacteria, UA: NONE SEEN — AB
Bilirubin Urine: NEGATIVE
Glucose, UA: NEGATIVE mg/dL
Hgb urine dipstick: NEGATIVE
KETONES UR: NEGATIVE mg/dL
Leukocytes, UA: NEGATIVE
Nitrite: NEGATIVE
PH: 5.5 (ref 5.0–8.0)
SPECIFIC GRAVITY, URINE: 1.03 (ref 1.005–1.030)

## 2015-05-02 MED ORDER — CEPHALEXIN 500 MG PO CAPS: 500.0000 mg | ORAL_CAPSULE | Freq: Three times a day (TID) | ORAL | Status: DC

## 2015-05-02 NOTE — Discharge Instructions (Signed)
Vitamin D Deficiency Vitamin D is an important vitamin that your body needs. Having too little of it in your body is called a deficiency. A very bad deficiency can make your bones soft and can cause a condition called rickets.  Vitamin D is important to your body for different reasons, such as:   It helps your body absorb 2 minerals called calcium and phosphorus.  It helps make your bones healthy.  It may prevent some diseases, such as diabetes and multiple sclerosis.  It helps your muscles and heart. You can get vitamin D in several ways. It is a natural part of some foods. The vitamin is also added to some dairy products and cereals. Some people take vitamin D supplements. Also, your body makes vitamin D when you are in the sun. It changes the sun's rays into a form of the vitamin that your body can use. CAUSES   Not eating enough foods that contain vitamin D.  Not getting enough sunlight.  Having certain digestive system diseases that make it hard to absorb vitamin D. These diseases include Crohn's disease, chronic pancreatitis, and cystic fibrosis.  Having a surgery in which part of the stomach or small intestine is removed.  Being obese. Fat cells pull vitamin D out of your blood. That means that obese people may not have enough vitamin D left in their blood and in other body tissues.  Having chronic kidney or liver disease. RISK FACTORS Risk factors are things that make you more likely to develop a vitamin D deficiency. They include:  Being older.  Not being able to get outside very much.  Living in a nursing home.  Having had broken bones.  Having weak or thin bones (osteoporosis).  Having a disease or condition that changes how your body absorbs vitamin D.  Having dark skin.  Some medicines such as seizure medicines or steroids.  Being overweight or obese. SYMPTOMS Mild cases of vitamin D deficiency may not have any symptoms. If you have a very bad case, symptoms  may include:  Bone pain.  Muscle pain.  Falling often.  Broken bones caused by a minor injury, due to osteoporosis. DIAGNOSIS A blood test is the best way to tell if you have a vitamin D deficiency. TREATMENT Vitamin D deficiency can be treated in different ways. Treatment for vitamin D deficiency depends on what is causing it. Options include:  Taking vitamin D supplements.  Taking a calcium supplement. Your caregiver will suggest what dose is best for you. HOME CARE INSTRUCTIONS  Take any supplements that your caregiver prescribes. Follow the directions carefully. Take only the suggested amount.  Have your blood tested 2 months after you start taking supplements.  Eat foods that contain vitamin D. Healthy choices include:  Fortified dairy products, cereals, or juices. Fortified means vitamin D has been added to the food. Check the label on the package to be sure.  Fatty fish like salmon or trout.  Eggs.  Oysters.  Do not use a tanning bed.  Keep your weight at a healthy level. Lose weight if you need to.  Keep all follow-up appointments. Your caregiver will need to perform blood tests to make sure your vitamin D deficiency is going away. SEEK MEDICAL CARE IF:  You have any questions about your treatment.  You continue to have symptoms of vitamin D deficiency.  You have nausea or vomiting.  You are constipated.  You feel confused.  You have severe abdominal or back pain. MAKE  SURE YOU:  Understand these instructions.  Will watch your condition.  Will get help right away if you are not doing well or get worse. Document Released: 11/19/2011 Document Revised: 12/22/2012 Document Reviewed: 11/19/2011 [facility] Patient Information 2015 [place], [place]. This information is not intended to replace advice given to you by your health care provider. Make sure you discuss any questions you have with your health care provider. Vitamin B12 Deficiency Not having enough  vitamin B12 is called a deficiency. Your body needs this vitamin for important body functions. HOME CARE  Take all vitamins, herbs, or nutrition drinks (supplements) as told by your doctor.  Get shots (injections) as told. Do not miss your doctor visit.  Eat foods than contain vitamin B12. This includes:  Meat.  Chicken, [place], or other birds (poultry).  Fish.  Eggs.  Cereals or milk with added vitamin B12. Check the label.  Do not drink too much (abuse) alcohol.  Keep all doctor visits as told. GET HELP IF:  You have questions.  Your problems come back. MAKE SURE YOU:  Understand these instructions.  Will watch your condition.  Will get help right away if you are not doing well or get worse. Document Released: 08/16/2011 Document Revised: 11/19/2011 Document Reviewed: 08/16/2011 [facility] Patient Information 2015 [place], [place]. This information is not intended to replace advice given to you by your health care provider. Make sure you discuss any questions you have with your health care provider. Iron Deficiency Anemia Anemia is when you have a low number of healthy red blood cells. It is often caused by too little iron. This is called iron deficiency anemia. It may make you tired and short of breath. HOME CARE   Take iron as told by your doctor.  Take vitamins as told by your doctor.  Eat foods that have iron in them. This includes liver, lean beef, whole-grain bread, eggs, dried fruit, and dark green leafy vegetables. GET HELP RIGHT AWAY IF:  You pass out (faint).  You have chest pain.  You feel sick to your stomach (nauseous) or throw up (vomit).  You get very short of breath with activity.  You are weak.  You have a fast heartbeat.  You start to sweat for no reason.  You become light-headed when getting up from a chair or bed. MAKE SURE YOU:  Understand these instructions.  Will watch your condition.  Will get help right away if you are not  doing well or get worse. Document Released: 09/29/2010 Document Revised: 09/01/2013 Document Reviewed: 05/04/2013 [facility] Patient Information 2015 [place], [place]. This information is not intended to replace advice given to you by your health care provider. Make sure you discuss any questions you have with your health care provider. Cellulitis Cellulitis is an infection of the skin and the tissue beneath it. The infected area is usually red and tender. Cellulitis occurs most often in the arms and lower legs.  CAUSES  Cellulitis is caused by bacteria that enter the skin through cracks or cuts in the skin. The most common types of bacteria that cause cellulitis are staphylococci and streptococci. SIGNS AND SYMPTOMS   Redness and warmth.  Swelling.  Tenderness or pain.  Fever. DIAGNOSIS  Your health care provider can usually determine what is wrong based on a physical exam. Blood tests may also be done. TREATMENT  Treatment usually involves taking an antibiotic medicine. HOME CARE INSTRUCTIONS   Take your antibiotic medicine as directed by your health care provider. Finish the antibiotic  even if you start to feel better.  Keep the infected arm or leg elevated to reduce swelling.  Apply a warm cloth to the affected area up to 4 times per day to relieve pain.  Take medicines only as directed by your health care provider.  Keep all follow-up visits as directed by your health care provider. SEEK MEDICAL CARE IF:   You notice red streaks coming from the infected area.  Your red area gets larger or turns dark in color.  Your bone or joint underneath the infected area becomes painful after the skin has healed.  Your infection returns in the same area or another area.  You notice a swollen bump in the infected area.  You develop new symptoms.  You have a fever. SEEK IMMEDIATE MEDICAL CARE IF:   You feel very sleepy.  You develop vomiting or diarrhea.  You have a general ill  feeling (malaise) with muscle aches and pains. MAKE SURE YOU:   Understand these instructions.  Will watch your condition.  Will get help right away if you are not doing well or get worse. Document Released: 06/06/2005 Document Revised: 01/11/2014 Document Reviewed: 11/12/2011 [facility] Patient Information 2015 [place], [place]. This information is not intended to replace advice given to you by your health care provider. Make sure you discuss any questions you have with your health care provider. Fatigue Fatigue is a feeling of tiredness, lack of energy, lack of motivation, or feeling tired all the time. Having enough rest, good nutrition, and reducing stress will normally reduce fatigue. Consult your caregiver if it persists. The nature of your fatigue will help your caregiver to find out its cause. The treatment is based on the cause.  CAUSES  There are many causes for fatigue. Most of the time, fatigue can be traced to one or more of your habits or routines. Most causes fit into one or more of three general areas. They are: Lifestyle problems  Sleep disturbances.  Overwork.  Physical exertion.  Unhealthy habits.  Poor eating habits or eating disorders.  Alcohol and/or drug use .  Lack of proper nutrition (malnutrition). Psychological problems  Stress and/or anxiety problems.  Depression.  Grief.  Boredom. Medical Problems or Conditions  Anemia.  Pregnancy.  Thyroid gland problems.  Recovery from major surgery.  Continuous pain.  Emphysema or asthma that is not well controlled  Allergic conditions.  Diabetes.  Infections (such as mononucleosis).  Obesity.  Sleep disorders, such as sleep apnea.  Heart failure or other heart-related problems.  Cancer.  Kidney disease.  Liver disease.  Effects of certain medicines such as antihistamines, cough and cold remedies, prescription pain medicines, heart and blood pressure medicines, drugs used for treatment  of cancer, and some antidepressants. SYMPTOMS  The symptoms of fatigue include:   Lack of energy.  Lack of drive (motivation).  Drowsiness.  Feeling of indifference to the surroundings. DIAGNOSIS  The details of how you feel help guide your caregiver in finding out what is causing the fatigue. You will be asked about your present and past health condition. It is important to review all medicines that you take, including prescription and non-prescription items. A thorough exam will be done. You will be questioned about your feelings, habits, and normal lifestyle. Your caregiver may suggest blood tests, urine tests, or other tests to look for common medical causes of fatigue.  TREATMENT  Fatigue is treated by correcting the underlying cause. For example, if you have continuous pain or depression, treating these  causes will improve how you feel. Similarly, adjusting the dose of certain medicines will help in reducing fatigue.  HOME CARE INSTRUCTIONS   Try to get the required amount of good sleep every night.  Eat a healthy and nutritious diet, and drink enough water throughout the day.  Practice ways of relaxing (including yoga or meditation).  Exercise regularly.  Make plans to change situations that cause stress. Act on those plans so that stresses decrease over time. Keep your work and personal routine reasonable.  Avoid street drugs and minimize use of alcohol.  Start taking a daily multivitamin after consulting your caregiver. SEEK MEDICAL CARE IF:   You have persistent tiredness, which cannot be accounted for.  You have fever.  You have unintentional weight loss.  You have headaches.  You have disturbed sleep throughout the night.  You are feeling sad.  You have constipation.  You have dry skin.  You have gained weight.  You are taking any new or different medicines that you suspect are causing fatigue.  You are unable to sleep at night.  You develop any  unusual swelling of your legs or other parts of your body. SEEK IMMEDIATE MEDICAL CARE IF:   You are feeling confused.  Your vision is blurred.  You feel faint or pass out.  You develop severe headache.  You develop severe abdominal, pelvic, or back pain.  You develop chest pain, shortness of breath, or an irregular or fast heartbeat.  You are unable to pass a normal amount of urine.  You develop abnormal bleeding such as bleeding from the rectum or you vomit blood.  You have thoughts about harming yourself or committing suicide.  You are worried that you might harm someone else. MAKE SURE YOU:   Understand these instructions.  Will watch your condition.  Will get help right away if you are not doing well or get worse. Document Released: 06/24/2007 Document Revised: 11/19/2011 Document Reviewed: 12/29/2013 [facility] Patient Information 2015 [place], [place]. This information is not intended to replace advice given to you by your health care provider. Make sure you discuss any questions you have with your health care provider. Gastrointestinal Bleeding Gastrointestinal (GI) bleeding means there is bleeding somewhere along the digestive tract, between the mouth and anus. CAUSES  There are many different problems that can cause GI bleeding. Possible causes include:  Esophagitis. This is inflammation, irritation, or swelling of the esophagus.  Hemorrhoids.These are veins that are full of blood (engorged) in the rectum. They cause pain, inflammation, and may bleed.  Anal fissures.These are areas of painful tearing which may bleed. They are often caused by passing hard stool.  Diverticulosis.These are pouches that form on the colon over time, with age, and may bleed significantly.  Diverticulitis.This is inflammation in areas with diverticulosis. It can cause pain, fever, and bloody stools, although bleeding is rare.  Polyps and cancer. Colon cancer often starts out as  precancerous polyps.  Gastritis and ulcers.Bleeding from the upper gastrointestinal tract (near the stomach) may travel through the intestines and produce black, sometimes tarry, often bad smelling stools. In certain cases, if the bleeding is fast enough, the stools may not be black, but red. This condition may be life-threatening. SYMPTOMS   Vomiting bright red blood or material that looks like coffee grounds.  Bloody, black, or tarry stools. DIAGNOSIS  Your caregiver may diagnose your condition by taking your history and performing a physical exam. More tests may be needed, including:  X-rays and other imaging  tests.  Esophagogastroduodenoscopy (EGD). This test uses a flexible, lighted tube to look at your esophagus, stomach, and small intestine.  Colonoscopy. This test uses a flexible, lighted tube to look at your colon. TREATMENT  Treatment depends on the cause of your bleeding.   For bleeding from the esophagus, stomach, small intestine, or colon, the caregiver doing your EGD or colonoscopy may be able to stop the bleeding as part of the procedure.  Inflammation or infection of the colon can be treated with medicines.  Many rectal problems can be treated with creams, suppositories, or warm baths.  Surgery is sometimes needed.  Blood transfusions are sometimes needed if you have lost a lot of blood. If bleeding is slow, you may be allowed to go home. If there is a lot of bleeding, you will need to stay in the hospital for observation. HOME CARE INSTRUCTIONS   Take any medicines exactly as prescribed.  Keep your stools soft by eating foods that are high in fiber. These foods include whole grains, legumes, fruits, and vegetables. Prunes (1 to 3 a day) work well for many people.  Drink enough fluids to keep your urine clear or pale yellow. SEEK IMMEDIATE MEDICAL CARE IF:   Your bleeding increases.  You feel lightheaded, weak, or you faint.  You have severe cramps in your  back or abdomen.  You pass large blood clots in your stool.  Your problems are getting worse. MAKE SURE YOU:   Understand these instructions.  Will watch your condition.  Will get help right away if you are not doing well or get worse. Document Released: 08/24/2000 Document Revised: 08/13/2012 Document Reviewed: 08/06/2011 [facility] Patient Information 2015 [place], [place]. This information is not intended to replace advice given to you by your health care provider. Make sure you discuss any questions you have with your health care provider. Urinary Incontinence Urinary incontinence is the involuntary loss of urine from your bladder. CAUSES  There are many causes of urinary incontinence. They include:  Medicines.  Infections.  Prostatic enlargement, leading to overflow of urine from your bladder.  Surgery.  Neurological diseases.  Emotional factors. SIGNS AND SYMPTOMS Urinary Incontinence can be divided into four types:  Urge incontinence. Urge incontinence is the involuntary loss of urine before you have the opportunity to go to the bathroom. There is a sudden urge to void but not enough time to reach a bathroom.  Stress incontinence. Stress incontinence is the sudden loss of urine with any activity that forces urine to pass. It is commonly caused by anatomical changes to the pelvis and sphincter areas of your body.  Overflow incontinence. Overflow incontinence is the loss of urine from an obstructed opening to your bladder. This results in a backup of urine and a resultant buildup of pressure within the bladder. When the pressure within the bladder exceeds the closing pressure of the sphincter, the urine overflows, which causes incontinence, similar to water overflowing a dam.  Total incontinence. Total incontinence is the loss of urine as a result of the inability to store urine within your bladder. DIAGNOSIS  Evaluating the cause of incontinence may require:  A thorough  and complete medical and obstetric history.  A complete physical exam.  Laboratory tests such as a urine culture and sensitivities. When additional tests are indicated, they can include:  An ultrasound exam.  Kidney and bladder X-rays.  Cystoscopy. This is an exam of the bladder using a narrow scope.  Urodynamic testing to test the nerve function  to the bladder and sphincter areas. TREATMENT  Treatment for urinary incontinence depends on the cause:  For urge incontinence caused by a bacterial infection, antibiotics will be prescribed. If the urge incontinence is related to medicines you take, your health care provider may have you change the medicine.  For stress incontinence, surgery to re-establish anatomical support to the bladder or sphincter, or both, will often correct the condition.  For overflow incontinence caused by an enlarged prostate, an operation to open the channel through the enlarged prostate will allow the flow of urine out of the bladder. In women with fibroids, a hysterectomy may be recommended.  For total incontinence, surgery on your urinary sphincter may help. An artificial urinary sphincter (an inflatable cuff placed around the urethra) may be required. In women who have developed a hole-like passage between their bladder and vagina (vesicovaginal fistula), surgery to close the fistula often is required. HOME CARE INSTRUCTIONS  Normal daily hygiene and the use of pads or adult diapers that are changed regularly will help prevent odors and skin damage.  Avoid caffeine. It can overstimulate your bladder.  Use the bathroom regularly. Try about every 2-3 hours to go to the bathroom, even if you do not feel the need to do so. Take time to empty your bladder completely. After urinating, wait a minute. Then try to urinate again.  For causes involving nerve dysfunction, keep a log of the medicines you take and a journal of the times you go to the bathroom. SEEK  MEDICAL CARE IF:  You experience worsening of pain instead of improvement in pain after your procedure.  Your incontinence becomes worse instead of better. SEE IMMEDIATE MEDICAL CARE IF:  You experience fever or shaking chills.  You are unable to pass your urine.  You have redness spreading into your groin or down into your thighs. MAKE SURE YOU:   Understand these instructions.   Will watch your condition.  Will get help right away if you are not doing well or get worse. Document Released: 10/04/2004 Document Revised: 06/17/2013 Document Reviewed: 02/03/2013 [facility] Patient Information 2015 [place], [place]. This information is not intended to replace advice given to you by your health care provider. Make sure you discuss any questions you have with your health care provider. Kegel Exercises The goal of Kegel exercises is to isolate and exercise your pelvic floor muscles. These muscles act as a hammock that supports the rectum, vagina, small intestine, and uterus. As the muscles weaken, the hammock sags and these organs are displaced from their normal positions. Kegel exercises can strengthen your pelvic floor muscles and help you to improve bladder and bowel control, improve sexual response, and help reduce many problems and some discomfort during pregnancy. Kegel exercises can be done anywhere and at any time. HOW TO PERFORM [place] your pelvic floor muscles. To do this, squeeze (contract) the muscles that you use when you try to stop the flow of urine. You will feel a tightness in the vaginal area (women) and a tight lift in the rectal area (men and women).  When you begin, contract your pelvic muscles tight for 2-5 seconds, then relax them for 2-5 seconds. This is one set. Do 4-5 sets with a short pause in between.  Contract your pelvic muscles for 8-10 seconds, then relax them for 8-10 seconds. Do 4-5 sets. If you cannot contract your pelvic muscles for 8-10  seconds, try 5-7 seconds and work your way up to 8-10 seconds. Your  goal is 4-5 sets of 10 contractions each day. Keep your stomach, buttocks, and legs relaxed during the exercises. Perform sets of both short and long contractions. Vary your positions. Perform these contractions 3-4 times per day. Perform sets while you are:   Lying in bed in the morning.  Standing at lunch.  Sitting in the late afternoon.  Lying in bed at night. You should do 40-50 contractions per day. Do not perform more Kegel exercises per day than recommended. Overexercising can cause muscle fatigue. Continue these exercises for for at least 15-20 weeks or as directed by your caregiver. Document Released: 08/13/2012 Document Reviewed: 08/13/2012 [facility] Patient Information 2015 [place]. This information is not intended to replace advice given to you by your health care provider. Make sure you discuss any questions you have with your health care provider.

## 2015-05-02 NOTE — ED Provider Notes (Addendum)
CSN: [id number]     Arrival date & time 05/02/15  0258 History   First MD Initiated Contact with Patient 05/02/15 0900     Chief Complaint  Patient presents with  . Insect Bite   (Consider location/radiation/quality/duration/timing/severity/associated sxs/prior Treatment) HPI Comments: Elderly caucasian female with worsening back/joint pain, fatigue, swollen red area left shin.  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  Noticed bleeding left leg yesterday afternoon today red tender spot.  Has seen PCM and specialist recently and was told Vitamin B12 and Vitamin D low and was started on 50,000 units po weekly raised from 2000 units daily.  Patient feels like left shin increased redness and size of knot.  Worsening urinary incontinence wearing diapers.    Black stools x 2 days this past week.  None yesterday or today.  Patient reported she ate chips this week and they typically upset her stomach.   The history is provided by the patient.    Past Medical History  Diagnosis Date  . Arthritis   . Blood in stool   . Cancer   . Chicken pox   . Ulcer   . Hypertension   . Urinary incontinence   . Ovarian cancer 1991    s/p hysterectomy at [facility]  . Diabetes mellitus 2000  . Pernicious anemia   . GERD (gastroesophageal reflux disease)   . Hiatal hernia   . Kidney infection 2014  . Spinal stenosis    Past Surgical History  Procedure Laterality Date  . Shoulder surgery  2013    fracture right shoulder  . Foot surgery  2000  . Abdominal hysterectomy     Family History  Problem Relation Age of Onset  . Arthritis Mother   . Cancer Mother     colon  . Arthritis Father   . Cancer Father     colon  . Cancer Other     lung  . Cancer Sister     lung   Social History  Substance Use Topics  . Smoking status: Never Smoker   . Smokeless tobacco: None  . Alcohol Use: No   OB History    No data available     Review of Systems  Constitutional: Positive for  activity change and fatigue. Negative for fever, chills, diaphoresis and appetite change.  HENT: Negative for congestion, dental problem, drooling, ear discharge, ear pain, facial swelling, hearing loss, mouth sores, nosebleeds, postnasal drip, rhinorrhea, sinus pressure, sneezing, sore throat, tinnitus, trouble swallowing and voice change.   Eyes: Negative for photophobia, pain, discharge, redness, itching and visual disturbance.  Respiratory: Negative for cough, choking, chest tightness, shortness of breath, wheezing and stridor.   Cardiovascular: Negative for chest pain and palpitations.  Gastrointestinal: Negative for nausea, vomiting, abdominal pain, diarrhea, constipation, blood in stool, abdominal distention, anal bleeding and rectal pain.  Endocrine: Negative for cold intolerance and heat intolerance.  Genitourinary: Positive for dysuria, urgency and frequency. Negative for hematuria, decreased urine volume, vaginal bleeding, vaginal discharge, difficulty urinating, genital sores, menstrual problem and pelvic pain.  Musculoskeletal: Positive for myalgias, back pain and arthralgias. Negative for joint swelling, gait problem, neck pain and neck stiffness.  Skin: Positive for color change and rash. Negative for pallor and wound.  Allergic/Immunologic: Positive for immunocompromised state. Negative for environmental allergies and food allergies.  Neurological: Positive for weakness. Negative for dizziness, tremors, seizures, syncope, facial asymmetry, speech difficulty, light-headedness, numbness and headaches.  Hematological: Negative for adenopathy. Does not bruise/bleed  easily.  Psychiatric/Behavioral: Negative for behavioral problems, confusion, sleep disturbance and agitation.    Allergies  Aspirin and Sulfa antibiotics  Home Medications   Prior to Admission medications   Medication Sig Start Date End Date Taking? Authorizing Provider  vitamin B-12 (CYANOCOBALAMIN) 1000 MCG tablet  Take 1,000 mcg by mouth daily.   Yes Historical Provider, MD  Vitamin D, Ergocalciferol, (DRISDOL) 50000 UNITS CAPS capsule Take 50,000 Units by mouth every 7 (seven) days.   Yes Historical Provider, MD  cephALEXin (KEFLEX) 500 MG capsule Take 1 capsule (500 mg total) by mouth 3 (three) times daily. 05/02/15   Olen Cordial, NP  ferrous sulfate 324 (65 FE) MG TBEC Take 1 tablet by mouth daily.     Historical Provider, MD  fesoterodine (TOVIAZ) 8 MG TB24 Take 4 mg by mouth daily.     Historical Provider, MD  gabapentin (NEURONTIN) 100 MG capsule Take 1 capsule (100 mg total) by mouth as needed. Take one capsule by mouth every night at bedtime. 10/26/14   Jackolyn Confer, MD  glipiZIDE (GLUCOTROL XL) 10 MG 24 hr tablet TAKE 1 TABLET BY MOUTH TWICE DAILY 09/24/14   Jackolyn Confer, MD  lisinopril (PRINIVIL,ZESTRIL) 10 MG tablet TAKE 1 TABLET BY MOUTH EVERY DAY 01/06/15   Jackolyn Confer, MD  metFORMIN (GLUCOPHAGE) 1000 MG tablet TAKE 1 TABLET BY MOUTH TWICE DAILY WITH A MEAL 10/08/14   Jackolyn Confer, MD  pantoprazole (PROTONIX) 40 MG tablet Take 40 mg by mouth 2 (two) times daily.    Historical Provider, MD  sucralfate (CARAFATE) 1 G tablet Take 1 g by mouth 2 (two) times daily.    Historical Provider, MD  traZODone (DESYREL) 50 MG tablet Take 0.5-1 tablets (25-50 mg total) by mouth at bedtime as needed for sleep. 11/16/14   Jackolyn Confer, MD  VITAMIN B1-B12 IJ Inject 1,000 mcg as directed every 30 (thirty) days.    Historical Provider, MD   BP 145/65 mmHg  Pulse 72  Temp(Src) 96 F (35.6 C) (Tympanic)  Resp 16  Ht 5\' 3"  (1.6 m)  Wt 205 lb (92.987 kg)  BMI 36.32 kg/m2  SpO2 98% Physical Exam  Constitutional: She is oriented to person, place, and time. Vital signs are normal. She appears well-developed and well-nourished. No distress.  HENT:  Head: Normocephalic and atraumatic.  Right Ear: External ear normal.  Left Ear: External ear normal.  Nose: Nose normal.  Mouth/Throat:  Oropharynx is clear and moist. No oropharyngeal exudate.  Eyes: Conjunctivae, EOM and lids are normal. Pupils are equal, round, and reactive to light. Right eye exhibits no discharge. Left eye exhibits no discharge. No scleral icterus.  Neck: Trachea normal and normal range of motion. Neck supple. No tracheal deviation present. No thyromegaly present.  Cardiovascular: Normal rate, regular rhythm, normal heart sounds and intact distal pulses.  Exam reveals no gallop and no friction rub.   No murmur heard. Pulmonary/Chest: Effort normal and breath sounds normal. No stridor. No respiratory distress. She has no wheezes. She has no rales. She exhibits no tenderness.  Abdominal: Soft. Bowel sounds are normal. She exhibits no distension and no mass. There is no tenderness. There is no rebound and no guarding.  Musculoskeletal: Normal range of motion. She exhibits edema and tenderness.  Lymphadenopathy:    She has no cervical adenopathy.  Neurological: She is alert and oriented to person, place, and time. She exhibits normal muscle tone. Coordination normal.  Skin: Skin is warm, dry and  intact. Rash noted. No abrasion, no bruising, no burn, no ecchymosis, no laceration, no lesion, no petechiae and no purpura noted. Rash is macular. Rash is not papular, not maculopapular, not nodular, not pustular, not vesicular and not urticarial. She is not diaphoretic. There is erythema. No cyanosis. No pallor. Nails show no clubbing.     Psychiatric: She has a normal mood and affect. Her speech is normal and behavior is normal. Judgment and thought content normal. Cognition and memory are normal.  Nursing note and vitals reviewed.   ED Course  Procedures (including critical care time) Labs Review Labs Reviewed  URINALYSIS COMPLETEWITH MICROSCOPIC ([facility] ONLY) - Abnormal; Notable for the following:    Protein, ur TRACE (*)    Bacteria, UA NONE SEEN (*)    Squamous Epithelial / LPF 0-5 (*)    All other components  within normal limits  CBC WITH DIFFERENTIAL/PLATELET - Abnormal; Notable for the following:    RBC 3.25 (*)    Hemoglobin 11.3 (*)    HCT 33.4 (*)    MCV 102.8 (*)    MCH 34.7 (*)    All other components within normal limits  BASIC METABOLIC PANEL - Abnormal; Notable for the following:    Chloride 100 (*)    Glucose, Bld 181 (*)    Calcium 8.8 (*)    GFR calc non Af Amer 51 (*)    GFR calc Af Amer 60 (*)    All other components within normal limits  URINE CULTURE    Imaging Review No results found. 1000 ambulated slowly gait steady from exam room to bathroom.  Required assist on/off exam table patient afraid she would lose balance on step.  1030 discussed urinalysis results with patient and given copy of results.  Still awaiting results of serum tests.  Discussed kegel exercises with patient and given exitcare handout.  Will call once urine culture results available later this week.  Patient to follow up with PCM if worsening of symptoms despite kegel exercises or new cloudy, bloody, foul smelling urine/fever.  Patient verbalized understanding of information/instructions, agreed with plan of care and had no further questions at this time.  1050 discussed CBC results with patient slight worsening of Hct/hgb (given copy of BMP and CBC resulst) and she stated she would go to Dr Ronnald Ramp office to schedule her B12 shot now and contact her GI provider for her due annual exam.  Avoid foods that worsen her GI symptoms or known to cause gastric ulcer flare and follow up with a provider (ER, [place], PCM or GI ) same day if bright red blood in stool or black stools resume.  Patient verbalized understanding of information/instructions, agreed with plan of care and had no further questions at this time. MDM   1. Cellulitis and abscess of leg   2. Other fatigue   3. Urinary incontinence in female   4. Anemia, unspecified anemia type   5. History of bleeding ulcers    Possible horse fly bite left lower  leg with subsequent infection.  Will treat for cellulitis possible bug bite to left lower back. Sulfa allergy will rx keflex 500mg  po TID x 7 days.  Exitcare handout on skin infection given to patient.  RTC if worsening erythema, pain, purulent discharge, fever.  Wash towels, washcloths, sheets in hot water with bleach every couple of days until infection resolved.  Patient verbalized understanding, agreed with plan of care and had no further questions at this time.  History of urinary incontinence worsening this past week.  Urinalysis normal.  Urine culture pending will contact patient with results once available typically 48 hours.  Patient to start kegel exercises given exitcare handout on urinary incontinence and kegal exercises and follow up with [facility] for re-evaluation especially if worsening of symptoms despite kegel exercises building up to 3 sets of 10 daily.  Patient verbalized understanding of information/instructions, agreed with plan of care and had no further questions at this time.  Fatigue history of B12/iron deficiency anemia, bleeding ulcers.  Patient recently changed Vitamin D supplement to 50000 units po weekly from 2000 IUs po daily per PCM.  Due to follow up with PCM in a couple of weeks.  Taking B12 shots and pills.  Shot due patient will contact Dr Ronnald Ramp office to schedule this week.  Worsening anemia per CBC patient to follow up with GI and PCM if tarry or bloody stools restarts this week to follow up same day as symptoms for re-evaluation.  Avoid foods that worsen GI symptoms.  GI had decided with patient to stop scopes due to age but if symptoms reoccuring patient needs re-evaluation with provider and due for annual appt.  Continue to takeExitcare handout on anemia, B12 deficiency, Vitamin D deficiency, fatigue, bleeding ulcers given to patient.  Patient verbalized understanding of information/instructions, agreed with plan of care and had no further questions at this time.   Olen Cordial, NP 05/02/15 1252  Patient returned call notified urine culture results contaminated.  Patient stated leg rash resolved with two days of antibiotics.  Patient denied headache, back pain, hematuria, cloudy or smelly urine, reoccurance of black stools.  Still having urinary frequency but her normal amount no worsening.  Denied incontinence.  Discussed with patient to follow up with GI and her PCM regarding slightly worsening H/H and black stools last week.  Patient stated she would contact their offices and schedule appt.  Patient verbalized understanding of information/instructions and had no further questions at this time.  Olen Cordial, NP 05/09/15 [phone]

## 2015-05-02 NOTE — ED Notes (Signed)
Patient c/o possible insect bite on her left lower leg since Thursday.  Patient denies pain.

## 2015-05-04 LAB — URINE CULTURE

## 2015-05-05 ENCOUNTER — Ambulatory Visit (INDEPENDENT_AMBULATORY_CARE_PROVIDER_SITE_OTHER): Payer: Medicare Other

## 2015-05-05 DIAGNOSIS — E538 Deficiency of other specified B group vitamins: Secondary | ICD-10-CM | POA: Diagnosis not present

## 2015-05-05 MED ORDER — CYANOCOBALAMIN 1000 MCG/ML IJ SOLN
1000.0000 ug | Freq: Once | INTRAMUSCULAR | Status: AC
  Administered 2015-05-05: 1000 ug via INTRAMUSCULAR

## 2015-05-20 ENCOUNTER — Other Ambulatory Visit: Payer: Self-pay | Admitting: Internal Medicine

## 2015-05-26 ENCOUNTER — Ambulatory Visit (INDEPENDENT_AMBULATORY_CARE_PROVIDER_SITE_OTHER): Payer: Medicare Other | Admitting: Family Medicine

## 2015-05-26 ENCOUNTER — Encounter: Payer: Self-pay | Admitting: Family Medicine

## 2015-05-26 VITALS — BP 120/68 | HR 60 | Ht 64.0 in | Wt 206.0 lb

## 2015-05-26 DIAGNOSIS — R32 Unspecified urinary incontinence: Secondary | ICD-10-CM | POA: Diagnosis not present

## 2015-05-26 DIAGNOSIS — R350 Frequency of micturition: Secondary | ICD-10-CM

## 2015-05-26 DIAGNOSIS — N3090 Cystitis, unspecified without hematuria: Secondary | ICD-10-CM | POA: Diagnosis not present

## 2015-05-26 LAB — POCT URINALYSIS DIPSTICK
Bilirubin, UA: NEGATIVE
Glucose, UA: NEGATIVE
KETONES UA: NEGATIVE
LEUKOCYTES UA: NEGATIVE
Nitrite, UA: POSITIVE
PH UA: 6
Protein, UA: NEGATIVE
Spec Grav, UA: 1.01
Urobilinogen, UA: 0.2

## 2015-05-26 MED ORDER — CIPROFLOXACIN HCL 250 MG PO TABS: 250.0000 mg | ORAL_TABLET | Freq: Two times a day (BID) | ORAL | Status: DC

## 2015-05-26 MED ORDER — OXYBUTYNIN CHLORIDE 5 MG PO TABS: 5.0000 mg | ORAL_TABLET | Freq: Three times a day (TID) | ORAL | Status: DC

## 2015-05-26 NOTE — Progress Notes (Signed)
Name: Brenda [name]   MRN: [id number]    DOB: 04-23-1931   Date:05/26/2015       Progress Note  Subjective  Chief Complaint  No chief complaint on file.   Urinary Frequency  This is a recurrent problem. The current episode started in the past 7 days. The problem occurs intermittently. The quality of the pain is described as burning. Associated symptoms include frequency. Pertinent negatives include no chills, discharge, flank pain, hematuria, hesitancy, nausea or urgency. She has tried nothing for the symptoms. The treatment provided mild relief.    No problem-specific assessment & plan notes found for this encounter.   Past Medical History  Diagnosis Date  . Arthritis   . Blood in stool   . Cancer   . Chicken pox   . Ulcer   . Hypertension   . Urinary incontinence   . Ovarian cancer 1991    s/p hysterectomy at [facility]  . Diabetes mellitus 2000  . Pernicious anemia   . GERD (gastroesophageal reflux disease)   . Hiatal hernia   . Kidney infection 2014  . Spinal stenosis     Past Surgical History  Procedure Laterality Date  . Shoulder surgery  2013    fracture right shoulder  . Foot surgery  2000  . Abdominal hysterectomy      Family History  Problem Relation Age of Onset  . Arthritis Mother   . Cancer Mother     colon  . Arthritis Father   . Cancer Father     colon  . Cancer Other     lung  . Cancer Sister     lung    Social History   Social History  . Marital Status: Married    Spouse Name: N/A  . Number of Children: N/A  . Years of Education: N/A   Occupational History  . Not on file.   Social History Main Topics  . Smoking status: Never Smoker   . Smokeless tobacco: Not on file  . Alcohol Use: No  . Drug Use: No  . Sexual Activity: No   Other Topics Concern  . Not on file   Social History Narrative   Lives alone in [place]. 1 daughter living in [place]. Son died lung cancer.    Allergies  Allergen Reactions  . Aspirin Other (See  Comments)    bleeding  . Sulfa Antibiotics Swelling     Review of Systems  Constitutional: Negative for fever, chills, weight loss and malaise/fatigue.  HENT: Negative for ear discharge, ear pain and sore throat.   Eyes: Negative for blurred vision.  Respiratory: Negative for cough, sputum production, shortness of breath and wheezing.   Cardiovascular: Negative for chest pain, palpitations and leg swelling.  Gastrointestinal: Negative for heartburn, nausea, abdominal pain, diarrhea, constipation, blood in stool and melena.  Genitourinary: Positive for frequency. Negative for dysuria, hesitancy, urgency, hematuria and flank pain.  Musculoskeletal: Negative for myalgias, back pain, joint pain and neck pain.  Skin: Negative for rash.  Neurological: Negative for dizziness, tingling, sensory change, focal weakness and headaches.  Endo/Heme/Allergies: Negative for environmental allergies and polydipsia. Does not bruise/bleed easily.  Psychiatric/Behavioral: Negative for depression and suicidal ideas. The patient is not nervous/anxious and does not have insomnia.      Objective  Filed Vitals:   05/26/15 1008  BP: 120/68  Pulse: 60  Height: 5\' 4"  (1.626 m)  Weight: 206 lb (93.441 kg)    Physical Exam  Constitutional: She is well-developed,  well-nourished, and in no distress. No distress.  HENT:  Head: Normocephalic and atraumatic.  Right Ear: External ear normal.  Left Ear: External ear normal.  Nose: Nose normal.  Mouth/Throat: Oropharynx is clear and moist.  Eyes: Conjunctivae and EOM are normal. Pupils are equal, round, and reactive to light. Right eye exhibits no discharge. Left eye exhibits no discharge.  Neck: Normal range of motion. Neck supple. No JVD present. No thyromegaly present.  Cardiovascular: Normal rate, regular rhythm, normal heart sounds and intact distal pulses.  Exam reveals no gallop and no friction rub.   No murmur heard. Pulmonary/Chest: Effort normal and  breath sounds normal.  Abdominal: Soft. Bowel sounds are normal. She exhibits no mass. There is no tenderness. There is no guarding.  Musculoskeletal: Normal range of motion. She exhibits no edema.  Lymphadenopathy:    She has no cervical adenopathy.  Neurological: She is alert. She has normal reflexes.  Skin: Skin is warm and dry. She is not diaphoretic.  Psychiatric: Mood and affect normal.      Assessment & Plan  Problem List Items Addressed This Visit    None    Visit Diagnoses    Urinary frequency    -  Primary    Relevant Medications    ciprofloxacin (CIPRO) 250 MG tablet    Other Relevant Orders    POCT Urinalysis Dipstick (Completed)    Cystitis        Relevant Medications    ciprofloxacin (CIPRO) 250 MG tablet    Urinary incontinence in female        Relevant Medications    oxybutynin (DITROPAN) 5 MG tablet         Dr. Deanna Jones [facility]  05/26/2015

## 2015-05-31 DIAGNOSIS — E1165 Type 2 diabetes mellitus with hyperglycemia: Secondary | ICD-10-CM | POA: Diagnosis not present

## 2015-05-31 DIAGNOSIS — E559 Vitamin D deficiency, unspecified: Secondary | ICD-10-CM | POA: Diagnosis not present

## 2015-05-31 DIAGNOSIS — E538 Deficiency of other specified B group vitamins: Secondary | ICD-10-CM | POA: Diagnosis not present

## 2015-06-02 DIAGNOSIS — E559 Vitamin D deficiency, unspecified: Secondary | ICD-10-CM | POA: Diagnosis not present

## 2015-06-02 DIAGNOSIS — E538 Deficiency of other specified B group vitamins: Secondary | ICD-10-CM | POA: Diagnosis not present

## 2015-06-02 DIAGNOSIS — E1165 Type 2 diabetes mellitus with hyperglycemia: Secondary | ICD-10-CM | POA: Diagnosis not present

## 2015-06-02 DIAGNOSIS — R809 Proteinuria, unspecified: Secondary | ICD-10-CM | POA: Diagnosis not present

## 2015-06-02 DIAGNOSIS — E1129 Type 2 diabetes mellitus with other diabetic kidney complication: Secondary | ICD-10-CM | POA: Diagnosis not present

## 2015-06-02 DIAGNOSIS — E1142 Type 2 diabetes mellitus with diabetic polyneuropathy: Secondary | ICD-10-CM | POA: Diagnosis not present

## 2015-06-16 ENCOUNTER — Ambulatory Visit (INDEPENDENT_AMBULATORY_CARE_PROVIDER_SITE_OTHER): Payer: Medicare Other

## 2015-06-16 DIAGNOSIS — D519 Vitamin B12 deficiency anemia, unspecified: Secondary | ICD-10-CM

## 2015-06-16 MED ORDER — CYANOCOBALAMIN 1000 MCG/ML IJ SOLN
1000.0000 ug | Freq: Once | INTRAMUSCULAR | Status: AC
  Administered 2015-06-16: 1000 ug via INTRAMUSCULAR

## 2015-06-23 DIAGNOSIS — N3941 Urge incontinence: Secondary | ICD-10-CM | POA: Diagnosis not present

## 2015-06-23 DIAGNOSIS — R350 Frequency of micturition: Secondary | ICD-10-CM | POA: Diagnosis not present

## 2015-06-23 DIAGNOSIS — Z6837 Body mass index (BMI) 37.0-37.9, adult: Secondary | ICD-10-CM | POA: Diagnosis not present

## 2015-07-21 DIAGNOSIS — Z23 Encounter for immunization: Secondary | ICD-10-CM | POA: Diagnosis not present

## 2015-07-28 ENCOUNTER — Ambulatory Visit (INDEPENDENT_AMBULATORY_CARE_PROVIDER_SITE_OTHER): Payer: Medicare Other

## 2015-07-28 DIAGNOSIS — E538 Deficiency of other specified B group vitamins: Secondary | ICD-10-CM | POA: Diagnosis not present

## 2015-07-28 MED ORDER — CYANOCOBALAMIN 1000 MCG/ML IJ SOLN
1000.0000 ug | Freq: Once | INTRAMUSCULAR | Status: AC
  Administered 2015-07-28: 1000 ug via INTRAMUSCULAR

## 2015-08-18 ENCOUNTER — Encounter: Payer: Self-pay | Admitting: Internal Medicine

## 2015-08-18 ENCOUNTER — Ambulatory Visit (INDEPENDENT_AMBULATORY_CARE_PROVIDER_SITE_OTHER): Payer: Medicare Other | Admitting: Internal Medicine

## 2015-08-18 VITALS — BP 135/75 | HR 68 | Temp 98.4°F | Ht 64.0 in | Wt 207.4 lb

## 2015-08-18 DIAGNOSIS — R0602 Shortness of breath: Secondary | ICD-10-CM | POA: Diagnosis not present

## 2015-08-18 DIAGNOSIS — E118 Type 2 diabetes mellitus with unspecified complications: Secondary | ICD-10-CM | POA: Diagnosis not present

## 2015-08-18 DIAGNOSIS — I10 Essential (primary) hypertension: Secondary | ICD-10-CM | POA: Diagnosis not present

## 2015-08-18 DIAGNOSIS — Z1239 Encounter for other screening for malignant neoplasm of breast: Secondary | ICD-10-CM | POA: Diagnosis not present

## 2015-08-18 LAB — COMPREHENSIVE METABOLIC PANEL
ALBUMIN: 4 g/dL (ref 3.5–5.2)
ALK PHOS: 78 U/L (ref 39–117)
ALT: 14 U/L (ref 0–35)
AST: 18 U/L (ref 0–37)
BUN: 21 mg/dL (ref 6–23)
CALCIUM: 10.3 mg/dL (ref 8.4–10.5)
CO2: 26 mEq/L (ref 19–32)
Chloride: 104 mEq/L (ref 96–112)
Creatinine, Ser: 0.92 mg/dL (ref 0.40–1.20)
GFR: 62.08 mL/min (ref >60.00)
Glucose, Bld: 109 mg/dL — ABNORMAL HIGH (ref 70–99)
POTASSIUM: 4.3 meq/L (ref 3.5–5.1)
Sodium: 138 mEq/L (ref 135–145)
TOTAL PROTEIN: 6.6 g/dL (ref 6.0–8.3)
Total Bilirubin: 0.4 mg/dL (ref 0.2–1.2)

## 2015-08-18 LAB — LIPID PANEL
CHOLESTEROL: 149 mg/dL (ref 0–200)
HDL: 56.9 mg/dL (ref >39.00)
LDL Cholesterol: 72 mg/dL (ref 0–99)
NonHDL: 91.81
TRIGLYCERIDES: 99 mg/dL (ref 0.0–149.0)
Total CHOL/HDL Ratio: 3
VLDL: 19.8 mg/dL (ref 0.0–40.0)

## 2015-08-18 LAB — HEMOGLOBIN A1C: Hgb A1c MFr Bld: 8.1 % — ABNORMAL HIGH (ref 4.6–6.5)

## 2015-08-18 NOTE — Progress Notes (Signed)
Subjective:    Patient ID: Brenda [name], female    DOB: 07/13/1931, 81 y.o.   MRN: [id number]  HPI  81YO female presents for follow up.  Recently was not feeling well.Fatigued with worsening dyspnea with exertion. Told by Dr. Bernardo Heater to see Dr. Ronnald Ramp. Transferred care back to Dr. Ronnald Ramp in [place].  Concerned that chronic dyspnea on exertion might be related to her heart. Has shortness of breath even with walking a few feet. Sedentary. Seen by Dr. Rockey Situ in cardiology in 2015. Had stress myoview and ECHO which were normal.   DM - BG ranging from 50-60 to above 200. Compliant with medication. Has follow up scheduled with Dr. Eddie Dibbles.  Wt Readings from Last 3 Encounters:  08/18/15 207 lb 6 oz (94.065 kg)  05/26/15 206 lb (93.441 kg)  05/02/15 205 lb (92.987 kg)   BP Readings from Last 3 Encounters:  08/18/15 135/75  05/26/15 120/68  05/02/15 145/65    Past Medical History  Diagnosis Date  . Arthritis   . Blood in stool   . Cancer ([place])   . Chicken pox   . Ulcer   . Hypertension   . Urinary incontinence   . Ovarian cancer ([place]) 1991    s/p hysterectomy at [facility]  . Diabetes mellitus 2000  . Pernicious anemia   . GERD (gastroesophageal reflux disease)   . Hiatal hernia   . Kidney infection 2014  . Spinal stenosis    Family History  Problem Relation Age of Onset  . Arthritis Mother   . Cancer Mother     colon  . Arthritis Father   . Cancer Father     colon  . Cancer Other     lung  . Cancer Sister     lung   Past Surgical History  Procedure Laterality Date  . Shoulder surgery  2013    fracture right shoulder  . Foot surgery  2000  . Abdominal hysterectomy     Social History   Social History  . Marital Status: Married    Spouse Name: N/A  . Number of Children: N/A  . Years of Education: N/A   Social History Main Topics  . Smoking status: Never Smoker   . Smokeless tobacco: None  . Alcohol Use: No  . Drug Use: No  . Sexual Activity: No   Other  Topics Concern  . None   Social History Narrative   Lives alone in [place]. 1 daughter living in [place]. Son died lung cancer.    Review of Systems  Constitutional: Negative for fever, chills, appetite change, fatigue and unexpected weight change.  Eyes: Negative for visual disturbance.  Respiratory: Positive for shortness of breath. Negative for cough, chest tightness and wheezing.   Cardiovascular: Negative for chest pain, palpitations and leg swelling.  Gastrointestinal: Negative for nausea, vomiting, abdominal pain, diarrhea and constipation.  Musculoskeletal: Positive for myalgias, back pain and arthralgias.  Skin: Negative for color change and rash.  Neurological: Negative for weakness.  Hematological: Negative for adenopathy. Does not bruise/bleed easily.  Psychiatric/Behavioral: Negative for sleep disturbance and dysphoric mood. The patient is not nervous/anxious.        Objective:    BP 135/75 mmHg  Pulse 68  Temp(Src) 98.4 F (36.9 C) (Oral)  Ht 5\' 4"  (1.626 m)  Wt 207 lb 6 oz (94.065 kg)  BMI 35.58 kg/m2  SpO2 96% Physical Exam  Constitutional: She is oriented to person, place, and time. She appears well-developed and well-nourished.  No distress.  HENT:  Head: Normocephalic and atraumatic.  Right Ear: External ear normal.  Left Ear: External ear normal.  Nose: Nose normal.  Mouth/Throat: Oropharynx is clear and moist. No oropharyngeal exudate.  Eyes: Conjunctivae are normal. Pupils are equal, round, and reactive to light. Right eye exhibits no discharge. Left eye exhibits no discharge. No scleral icterus.  Neck: Normal range of motion. Neck supple. No tracheal deviation present. No thyromegaly present.  Cardiovascular: Normal rate, regular rhythm, normal heart sounds and intact distal pulses.  Exam reveals no gallop and no friction rub.   No murmur heard. Pulmonary/Chest: Effort normal and breath sounds normal. No accessory muscle usage. No tachypnea. No  respiratory distress. She has no decreased breath sounds. She has no wheezes. She has no rhonchi. She has no rales. She exhibits no tenderness.  Musculoskeletal: Normal range of motion. She exhibits no edema or tenderness.  Lymphadenopathy:    She has no cervical adenopathy.  Neurological: She is alert and oriented to person, place, and time. No cranial nerve deficit. She exhibits normal muscle tone. Coordination normal.  Skin: Skin is warm and dry. No rash noted. She is not diaphoretic. No erythema. No pallor.  Psychiatric: She has a normal mood and affect. Her behavior is normal. Judgment and thought content normal.          Assessment & Plan:   Problem List Items Addressed This Visit      Unprioritized   Diabetes mellitus type 2 with complications ([place]) - Primary    BG variable by her report. She has follow up with Dr. Eddie Dibbles at [place]. She will also follow up with Dr. Ronnald Ramp, her PCP. Will check A1c with labs today.      Relevant Orders   Comprehensive metabolic panel   Hemoglobin A1c   Lipid panel   Microalbumin / creatinine urine ratio   Essential hypertension, benign    BP Readings from Last 3 Encounters:  08/18/15 135/75  05/26/15 120/68  05/02/15 145/65   BP well controlled. Renal function with labs today. Continue current medications.      Screening for breast cancer    Mammogram ordered. She will follow up with Dr. Ronnald Ramp.      Relevant Orders   MM Digital Screening   Shortness of breath    Chronic dyspnea with exertion. Exam today normal. Reviewed previous cardiac workup from 2015 with patient. This showed normal myoview and ECHO. Recommended she discussed cardio-pulmonary rehab with Dr. Ronnald Ramp, who is her PCP.          No Follow-up on file.

## 2015-08-18 NOTE — Assessment & Plan Note (Signed)
BP Readings from Last 3 Encounters:  08/18/15 135/75  05/26/15 120/68  05/02/15 145/65   BP well controlled. Renal function with labs today. Continue current medications.

## 2015-08-18 NOTE — Patient Instructions (Addendum)
Labs today.  Follow up with Dr. Ronnald Ramp in [place].

## 2015-08-18 NOTE — Assessment & Plan Note (Signed)
Chronic dyspnea with exertion. Exam today normal. Reviewed previous cardiac workup from 2015 with patient. This showed normal myoview and ECHO. Recommended she discussed cardio-pulmonary rehab with Dr. Ronnald Ramp, who is her PCP.

## 2015-08-18 NOTE — Assessment & Plan Note (Signed)
Mammogram ordered. She will follow up with Dr. Ronnald Ramp.

## 2015-08-18 NOTE — Assessment & Plan Note (Signed)
BG variable by her report. She has follow up with Dr. Eddie Dibbles at [place]. She will also follow up with Dr. Ronnald Ramp, her PCP. Will check A1c with labs today.

## 2015-08-18 NOTE — Progress Notes (Signed)
Pre visit review using our clinic review tool, if applicable. No additional management support is needed unless otherwise documented below in the visit note. 

## 2015-08-23 ENCOUNTER — Other Ambulatory Visit: Payer: Self-pay | Admitting: Internal Medicine

## 2015-08-25 ENCOUNTER — Other Ambulatory Visit: Payer: Self-pay

## 2015-08-30 ENCOUNTER — Telehealth: Payer: Self-pay | Admitting: Internal Medicine

## 2015-08-30 ENCOUNTER — Ambulatory Visit (INDEPENDENT_AMBULATORY_CARE_PROVIDER_SITE_OTHER): Payer: Medicare Other

## 2015-08-30 DIAGNOSIS — E538 Deficiency of other specified B group vitamins: Secondary | ICD-10-CM

## 2015-08-30 MED ORDER — CYANOCOBALAMIN 1000 MCG/ML IJ SOLN
1000.0000 ug | Freq: Once | INTRAMUSCULAR | Status: AC
  Administered 2015-08-30: 1000 ug via INTRAMUSCULAR

## 2015-08-30 NOTE — Telephone Encounter (Addendum)
Left message for the patient to give me a call back in reference to her medication and appointment. Dr. Gilford Rile will refill her medication up to 30 days until she can get an appointment with Dr. Ronnald Ramp.  The patient returned my call she had her medication refilled by her endocrinologist. She is aware she is to go to Dr. Ronnald Ramp for her care.

## 2015-09-01 DIAGNOSIS — E559 Vitamin D deficiency, unspecified: Secondary | ICD-10-CM | POA: Diagnosis not present

## 2015-09-01 DIAGNOSIS — E1165 Type 2 diabetes mellitus with hyperglycemia: Secondary | ICD-10-CM | POA: Diagnosis not present

## 2015-09-08 ENCOUNTER — Encounter: Payer: Self-pay | Admitting: Family Medicine

## 2015-09-08 DIAGNOSIS — E1142 Type 2 diabetes mellitus with diabetic polyneuropathy: Secondary | ICD-10-CM | POA: Diagnosis not present

## 2015-09-08 DIAGNOSIS — E559 Vitamin D deficiency, unspecified: Secondary | ICD-10-CM | POA: Diagnosis not present

## 2015-09-08 DIAGNOSIS — R809 Proteinuria, unspecified: Secondary | ICD-10-CM | POA: Diagnosis not present

## 2015-09-08 DIAGNOSIS — E1129 Type 2 diabetes mellitus with other diabetic kidney complication: Secondary | ICD-10-CM | POA: Diagnosis not present

## 2015-09-08 DIAGNOSIS — E538 Deficiency of other specified B group vitamins: Secondary | ICD-10-CM | POA: Diagnosis not present

## 2015-09-14 ENCOUNTER — Ambulatory Visit: Payer: Medicare Other | Admitting: Internal Medicine

## 2015-09-15 DIAGNOSIS — E119 Type 2 diabetes mellitus without complications: Secondary | ICD-10-CM | POA: Diagnosis not present

## 2015-09-28 ENCOUNTER — Ambulatory Visit
Admission: RE | Admit: 2015-09-28 | Discharge: 2015-09-28 | Disposition: A | Discharge status: Home / Self Care | Payer: Medicare Other | Source: Ambulatory Visit | Attending: Internal Medicine | Admitting: Internal Medicine

## 2015-09-28 DIAGNOSIS — Z1231 Encounter for screening mammogram for malignant neoplasm of breast: Secondary | ICD-10-CM | POA: Diagnosis not present

## 2015-09-28 DIAGNOSIS — Z1239 Encounter for other screening for malignant neoplasm of breast: Secondary | ICD-10-CM

## 2015-10-03 ENCOUNTER — Ambulatory Visit
Admission: EM | Admit: 2015-10-03 | Discharge: 2015-10-03 | Disposition: A | Discharge status: Home / Self Care | Payer: Medicare Other | Attending: Family Medicine | Admitting: Family Medicine

## 2015-10-03 DIAGNOSIS — D510 Vitamin B12 deficiency anemia due to intrinsic factor deficiency: Secondary | ICD-10-CM | POA: Insufficient documentation

## 2015-10-03 DIAGNOSIS — I10 Essential (primary) hypertension: Secondary | ICD-10-CM | POA: Insufficient documentation

## 2015-10-03 DIAGNOSIS — K219 Gastro-esophageal reflux disease without esophagitis: Secondary | ICD-10-CM | POA: Insufficient documentation

## 2015-10-03 DIAGNOSIS — Z7984 Long term (current) use of oral hypoglycemic drugs: Secondary | ICD-10-CM | POA: Insufficient documentation

## 2015-10-03 DIAGNOSIS — K449 Diaphragmatic hernia without obstruction or gangrene: Secondary | ICD-10-CM | POA: Diagnosis not present

## 2015-10-03 DIAGNOSIS — E119 Type 2 diabetes mellitus without complications: Secondary | ICD-10-CM | POA: Insufficient documentation

## 2015-10-03 DIAGNOSIS — N390 Urinary tract infection, site not specified: Secondary | ICD-10-CM | POA: Diagnosis not present

## 2015-10-03 LAB — URINALYSIS COMPLETE WITH MICROSCOPIC (ARMC ONLY)
Bilirubin Urine: NEGATIVE
GLUCOSE, UA: NEGATIVE mg/dL
Ketones, ur: NEGATIVE mg/dL
NITRITE: POSITIVE — AB
PH: 5 (ref 5.0–8.0)
Protein, ur: NEGATIVE mg/dL
Specific Gravity, Urine: 1.025 (ref 1.005–1.030)

## 2015-10-03 MED ORDER — CIPROFLOXACIN HCL 250 MG PO TABS: 250.0000 mg | ORAL_TABLET | Freq: Two times a day (BID) | ORAL | Status: DC

## 2015-10-03 NOTE — ED Provider Notes (Signed)
CSN: [id number]     Arrival date & time 10/03/15  1245 History   First MD Initiated Contact with Patient 10/03/15 1404     Chief Complaint  Patient presents with  . Urinary Tract Infection   (Consider location/radiation/quality/duration/timing/severity/associated sxs/prior Treatment) Patient is a 82 y.o. female presenting with dysuria. The history is provided by the patient.  Dysuria Pain quality:  Burning Pain severity:  Moderate Onset quality:  Sudden Duration:  2 days Timing:  Constant Progression:  Worsening Chronicity:  New Recent urinary tract infections: no   Relieved by:  None tried Ineffective treatments:  None tried Urinary symptoms: discolored urine, frequent urination, hesitancy and incontinence   Urinary symptoms: no foul-smelling urine and no hematuria   Associated symptoms: no abdominal pain, no fever, no flank pain, no genital lesions, no nausea, no vaginal discharge and no vomiting     Past Medical History  Diagnosis Date  . Arthritis   . Blood in stool   . Chicken pox   . Ulcer   . Hypertension   . Urinary incontinence   . Diabetes mellitus 2000  . Pernicious anemia   . GERD (gastroesophageal reflux disease)   . Hiatal hernia   . Kidney infection 2014  . Spinal stenosis   . Cancer ([place])   . Ovarian cancer ([place]) 1991    s/p hysterectomy at [facility]   Past Surgical History  Procedure Laterality Date  . Shoulder surgery  2013    fracture right shoulder  . Foot surgery  2000  . Abdominal hysterectomy     Family History  Problem Relation Age of Onset  . Arthritis Mother   . Cancer Mother     colon  . Arthritis Father   . Cancer Father     colon  . Cancer Other     lung  . Cancer Sister     lung   Social History  Substance Use Topics  . Smoking status: Never Smoker   . Smokeless tobacco: None  . Alcohol Use: No   OB History    No data available     Review of Systems  Constitutional: Negative for fever.  Gastrointestinal: Negative for  nausea, vomiting and abdominal pain.  Genitourinary: Positive for dysuria. Negative for flank pain and vaginal discharge.    Allergies  Aspirin and Sulfa antibiotics  Home Medications   Prior to Admission medications   Medication Sig Start Date End Date Taking? Authorizing Provider  cholecalciferol (VITAMIN D) 1000 units tablet Take 2,000 Units by mouth daily.   Yes Historical Provider, MD  ferrous sulfate 324 (65 FE) MG TBEC Take 1 tablet by mouth daily.    Yes Historical Provider, MD  gabapentin (NEURONTIN) 100 MG capsule Take 1 capsule (100 mg total) by mouth as needed. Take one capsule by mouth every night at bedtime. 10/26/14  Yes Jackolyn Confer, MD  glipiZIDE (GLUCOTROL XL) 10 MG 24 hr tablet TAKE 1 TABLET BY MOUTH TWICE DAILY 05/23/15  Yes Jackolyn Confer, MD  lisinopril (PRINIVIL,ZESTRIL) 10 MG tablet TAKE 1 TABLET BY MOUTH EVERY DAY 01/06/15  Yes Jackolyn Confer, MD  metFORMIN (GLUCOPHAGE) 1000 MG tablet TAKE 1 TABLET BY MOUTH TWICE DAILY WITH MEALS 05/23/15  Yes Jackolyn Confer, MD  pantoprazole (PROTONIX) 40 MG tablet Take 40 mg by mouth 2 (two) times daily.   Yes Historical Provider, MD  VITAMIN B1-B12 IJ Inject 1,000 mcg as directed every 30 (thirty) days.   Yes Historical Provider, MD  Vitamin D, Ergocalciferol, (DRISDOL) 50000 units CAPS capsule Take 50,000 Units by mouth every 7 (seven) days.   Yes Historical Provider, MD  ciprofloxacin (CIPRO) 250 MG tablet Take 1 tablet (250 mg total) by mouth every 12 (twelve) hours. 10/03/15   Norval Gable, MD  fesoterodine (TOVIAZ) 8 MG TB24 Take 4 mg by mouth daily.     Historical Provider, MD  oxybutynin (DITROPAN) 5 MG tablet Take 1 tablet (5 mg total) by mouth 3 (three) times daily. 05/26/15   Juline Patch, MD  sucralfate (CARAFATE) 1 G tablet Take 1 g by mouth 2 (two) times daily.    Historical Provider, MD  traZODone (DESYREL) 50 MG tablet Take 0.5-1 tablets (25-50 mg total) by mouth at bedtime as needed for sleep. 11/16/14    Jackolyn Confer, MD  vitamin B-12 (CYANOCOBALAMIN) 1000 MCG tablet Take 1,000 mcg by mouth daily.    Historical Provider, MD   Meds Ordered and Administered this Visit  Medications - No data to display  BP 142/73 mmHg  Pulse 78  Temp(Src) 96.2 F (35.7 C) (Tympanic)  Resp 18  Ht 5\' 3"  (1.6 m)  Wt 207 lb (93.895 kg)  BMI 36.68 kg/m2  SpO2 98% No data found.   Physical Exam  Constitutional: She appears well-developed and well-nourished. No distress.  Abdominal: Soft. Bowel sounds are normal. She exhibits no distension and no mass. There is tenderness (mild suprapubic). There is no rebound and no guarding.  Skin: She is not diaphoretic.  Nursing note and vitals reviewed.   ED Course  Procedures (including critical care time)  Labs Review Labs Reviewed  URINALYSIS COMPLETEWITH MICROSCOPIC ([place]) - Abnormal; Notable for the following:    APPearance HAZY (*)    Hgb urine dipstick TRACE (*)    Nitrite POSITIVE (*)    Leukocytes, UA TRACE (*)    Bacteria, UA MANY (*)    Squamous Epithelial / LPF 0-5 (*)    All other components within normal limits  URINE CULTURE    Imaging Review No results found.   Visual Acuity Review  Right Eye Distance:   Left Eye Distance:   Bilateral Distance:    Right Eye Near:   Left Eye Near:    Bilateral Near:         MDM   1. UTI (lower urinary tract infection)    New Prescriptions   CIPROFLOXACIN (CIPRO) 250 MG TABLET    Take 1 tablet (250 mg total) by mouth every 12 (twelve) hours.   1. Lab results and diagnosis reviewed with patient 2. rx as per orders above; reviewed possible side effects, interactions, risks and benefits  3. Recommend supportive treatment with increased fluids  4. Follow-up prn if symptoms worsen or don't improve    Norval Gable, MD 10/03/15 1432

## 2015-10-03 NOTE — ED Notes (Signed)
Started 2 nights ago with low abdominal pain. States when finishes stream it "itches". States has Hx of UTI's

## 2015-10-06 LAB — URINE CULTURE: SPECIAL REQUESTS: NORMAL

## 2015-10-14 ENCOUNTER — Other Ambulatory Visit: Payer: Self-pay

## 2015-10-14 ENCOUNTER — Ambulatory Visit (INDEPENDENT_AMBULATORY_CARE_PROVIDER_SITE_OTHER): Payer: Medicare Other | Admitting: Family Medicine

## 2015-10-14 ENCOUNTER — Encounter: Payer: Self-pay | Admitting: Family Medicine

## 2015-10-14 VITALS — BP 124/80 | HR 72 | Ht 64.0 in | Wt 207.0 lb

## 2015-10-14 DIAGNOSIS — G4700 Insomnia, unspecified: Secondary | ICD-10-CM | POA: Diagnosis not present

## 2015-10-14 DIAGNOSIS — E538 Deficiency of other specified B group vitamins: Secondary | ICD-10-CM | POA: Diagnosis not present

## 2015-10-14 MED ORDER — CYANOCOBALAMIN 1000 MCG/ML IJ SOLN: 1000.0000 ug | Freq: Once | INTRAMUSCULAR | Status: DC

## 2015-10-14 MED ORDER — CYANOCOBALAMIN 1000 MCG/ML IJ SOLN
1000.0000 ug | Freq: Once | INTRAMUSCULAR | Status: AC
  Administered 2015-10-14: 1000 ug via INTRAMUSCULAR

## 2015-10-14 NOTE — Patient Instructions (Signed)
Insomnia Insomnia is a sleep disorder that makes it difficult to fall asleep or to stay asleep. Insomnia can cause tiredness (fatigue), low energy, difficulty concentrating, mood swings, and poor performance at work or school.  There are three different ways to classify insomnia:  Difficulty falling asleep.  Difficulty staying asleep.  Waking up too early in the morning. Any type of insomnia can be long-term (chronic) or short-term (acute). Both are common. Short-term insomnia usually lasts for three months or less. Chronic insomnia occurs at least three times a week for longer than three months. CAUSES  Insomnia may be caused by another condition, situation, or substance, such as:  Anxiety.  Certain medicines.  Gastroesophageal reflux disease (GERD) or other gastrointestinal conditions.  Asthma or other breathing conditions.  Restless legs syndrome, sleep apnea, or other sleep disorders.  Chronic pain.  Menopause. This may include hot flashes.  Stroke.  Abuse of alcohol, tobacco, or illegal drugs.  Depression.  Caffeine.   Neurological disorders, such as Alzheimer disease.  An overactive thyroid (hyperthyroidism). The cause of insomnia may not be known. RISK FACTORS Risk factors for insomnia include:  Gender. Women are more commonly affected than men.  Age. Insomnia is more common as you get older.  Stress. This may involve your professional or personal life.  Income. Insomnia is more common in people with lower income.  Lack of exercise.   Irregular work schedule or night shifts.  Traveling between different time zones. SIGNS AND SYMPTOMS If you have insomnia, trouble falling asleep or trouble staying asleep is the main symptom. This may lead to other symptoms, such as:  Feeling fatigued.  Feeling nervous about going to sleep.  Not feeling rested in the morning.  Having trouble concentrating.  Feeling irritable, anxious, or depressed. TREATMENT   Treatment for insomnia depends on the cause. If your insomnia is caused by an underlying condition, treatment will focus on addressing the condition. Treatment may also include:   Medicines to help you sleep.  Counseling or therapy.  Lifestyle adjustments. HOME CARE INSTRUCTIONS   Take medicines only as directed by your health care provider.  Keep regular sleeping and waking hours. Avoid naps.  Keep a sleep diary to help you and your health care provider figure out what could be causing your insomnia. Include:   When you sleep.  When you wake up during the night.  How well you sleep.   How rested you feel the next day.  Any side effects of medicines you are taking.  What you eat and drink.   Make your bedroom a comfortable place where it is easy to fall asleep:  Put up shades or special blackout curtains to block light from outside.  Use a white noise machine to block noise.  Keep the temperature cool.   Exercise regularly as directed by your health care provider. Avoid exercising right before bedtime.  Use relaxation techniques to manage stress. Ask your health care provider to suggest some techniques that may work well for you. These may include:  Breathing exercises.  Routines to release muscle tension.  Visualizing peaceful scenes.  Cut back on alcohol, caffeinated beverages, and cigarettes, especially close to bedtime. These can disrupt your sleep.  Do not overeat or eat spicy foods right before bedtime. This can lead to digestive discomfort that can make it hard for you to sleep.  Limit screen use before bedtime. This includes:  Watching TV.  Using your smartphone, tablet, and computer.  Stick to a routine. This   can help you fall asleep faster. Try to do a quiet activity, brush your teeth, and go to bed at the same time each night.  Get out of bed if you are still awake after 15 minutes of trying to sleep. Keep the lights down, but try reading or  doing a quiet activity. When you feel sleepy, go back to bed.  Make sure that you drive carefully. Avoid driving if you feel very sleepy.  Keep all follow-up appointments as directed by your health care provider. This is important. SEEK MEDICAL CARE IF:   You are tired throughout the day or have trouble in your daily routine due to sleepiness.  You continue to have sleep problems or your sleep problems get worse. SEEK IMMEDIATE MEDICAL CARE IF:   You have serious thoughts about hurting yourself or someone else.   This information is not intended to replace advice given to you by your health care provider. Make sure you discuss any questions you have with your health care provider.   Document Released: 08/24/2000 Document Revised: 05/18/2015 Document Reviewed: 05/28/2014 Elsevier Interactive Patient Education 2016 [employer]  

## 2015-10-14 NOTE — Progress Notes (Signed)
Name: Brenda [name]   MRN: [id number]    DOB: 10-23-1930   Date:10/14/2015       Progress Note  Subjective  Chief Complaint  Chief Complaint  Patient presents with  . Insomnia    can't sleep/ "wide awake"  . B12 Injection    Insomnia Primary symptoms: fragmented sleep, difficulty falling asleep, frequent awakening, no malaise/fatigue.  The onset quality is gradual. Exacerbated by: desyrel. PMH includes: associated symptoms present, no hypertension, no depression, family stress or anxiety, no restless leg syndrome, no work related stressors, no chronic pain, no apnea.  Prior diagnostic workup includes:  No prior workup.    No problem-specific assessment & plan notes found for this encounter.   Past Medical History  Diagnosis Date  . Arthritis   . Blood in stool   . Chicken pox   . Ulcer   . Hypertension   . Urinary incontinence   . Diabetes mellitus 2000  . Pernicious anemia   . GERD (gastroesophageal reflux disease)   . Hiatal hernia   . Kidney infection 2014  . Spinal stenosis   . Cancer ([place])   . Ovarian cancer ([place]) 1991    s/p hysterectomy at [facility]    Past Surgical History  Procedure Laterality Date  . Shoulder surgery  2013    fracture right shoulder  . Foot surgery  2000  . Abdominal hysterectomy      Family History  Problem Relation Age of Onset  . Arthritis Mother   . Cancer Mother     colon  . Arthritis Father   . Cancer Father     colon  . Cancer Other     lung  . Cancer Sister     lung    Social History   Social History  . Marital Status: Married    Spouse Name: N/A  . Number of Children: N/A  . Years of Education: N/A   Occupational History  . Not on file.   Social History Main Topics  . Smoking status: Never Smoker   . Smokeless tobacco: Not on file  . Alcohol Use: No  . Drug Use: No  . Sexual Activity: No   Other Topics Concern  . Not on file   Social History Narrative   Lives alone in [place]. 1 daughter living in  [place]. Son died lung cancer.    Allergies  Allergen Reactions  . Aspirin Other (See Comments)    bleeding  . Sulfa Antibiotics Swelling     Review of Systems  Constitutional: Negative for fever, chills, weight loss and malaise/fatigue.  HENT: Negative for ear discharge, ear pain and sore throat.   Eyes: Negative for blurred vision.  Respiratory: Negative for apnea, cough, sputum production, shortness of breath and wheezing.   Cardiovascular: Negative for chest pain, palpitations and leg swelling.  Gastrointestinal: Negative for heartburn, nausea, abdominal pain, diarrhea, constipation, blood in stool and melena.  Genitourinary: Negative for dysuria, urgency, frequency and hematuria.  Musculoskeletal: Negative for myalgias, back pain, joint pain and neck pain.  Skin: Negative for rash.  Neurological: Negative for dizziness, tingling, sensory change, focal weakness and headaches.  Endo/Heme/Allergies: Negative for environmental allergies and polydipsia. Does not bruise/bleed easily.  Psychiatric/Behavioral: Negative for depression and suicidal ideas. The patient has insomnia. The patient is not nervous/anxious.      Objective  Filed Vitals:   10/14/15 1343  BP: 124/80  Pulse: 72  Height: 5\' 4"  (1.626 m)  Weight: 207 lb (93.895 kg)  Physical Exam  Constitutional: She is well-developed, well-nourished, and in no distress. No distress.  HENT:  Head: Normocephalic and atraumatic.  Right Ear: External ear normal.  Left Ear: External ear normal.  Nose: Nose normal.  Mouth/Throat: Oropharynx is clear and moist.  Eyes: Conjunctivae and EOM are normal. Pupils are equal, round, and reactive to light. Right eye exhibits no discharge. Left eye exhibits no discharge.  Neck: Normal range of motion. Neck supple. No JVD present. No thyromegaly present.  Cardiovascular: Normal rate, regular rhythm, normal heart sounds and intact distal pulses.  Exam reveals no gallop and no friction  rub.   No murmur heard. Pulmonary/Chest: Effort normal and breath sounds normal.  Abdominal: Soft. Bowel sounds are normal. She exhibits no mass. There is no tenderness. There is no guarding.  Musculoskeletal: Normal range of motion. She exhibits no edema.  Lymphadenopathy:    She has no cervical adenopathy.  Neurological: She is alert.  Skin: Skin is warm and dry. She is not diaphoretic.  Psychiatric: Mood and affect normal.      Assessment & Plan  Problem List Items Addressed This Visit      Digestive   B12 deficiency   Relevant Medications   cyanocobalamin ((VITAMIN B-12)) injection 1,000 mcg   cyanocobalamin ((VITAMIN B-12)) injection 1,000 mcg (Completed) (Start on 10/14/2015  3:00 PM)     Other   Insomnia - Primary        Dr. Macon [facility]  10/14/2015

## 2015-10-25 ENCOUNTER — Emergency Department
Admission: EM | Admit: 2015-10-25 | Discharge: 2015-10-25 | Disposition: A | Discharge status: Home / Self Care | Payer: Medicare Other | Attending: Emergency Medicine | Admitting: Emergency Medicine

## 2015-10-25 ENCOUNTER — Encounter: Payer: Self-pay | Admitting: Medical Oncology

## 2015-10-25 ENCOUNTER — Emergency Department: Payer: Medicare Other

## 2015-10-25 DIAGNOSIS — E119 Type 2 diabetes mellitus without complications: Secondary | ICD-10-CM | POA: Insufficient documentation

## 2015-10-25 DIAGNOSIS — Z7984 Long term (current) use of oral hypoglycemic drugs: Secondary | ICD-10-CM | POA: Diagnosis not present

## 2015-10-25 DIAGNOSIS — Z792 Long term (current) use of antibiotics: Secondary | ICD-10-CM | POA: Diagnosis not present

## 2015-10-25 DIAGNOSIS — Z79899 Other long term (current) drug therapy: Secondary | ICD-10-CM | POA: Insufficient documentation

## 2015-10-25 DIAGNOSIS — R0789 Other chest pain: Secondary | ICD-10-CM | POA: Diagnosis not present

## 2015-10-25 DIAGNOSIS — R079 Chest pain, unspecified: Secondary | ICD-10-CM | POA: Diagnosis present

## 2015-10-25 DIAGNOSIS — I10 Essential (primary) hypertension: Secondary | ICD-10-CM | POA: Diagnosis not present

## 2015-10-25 DIAGNOSIS — R0602 Shortness of breath: Secondary | ICD-10-CM | POA: Diagnosis not present

## 2015-10-25 LAB — COMPREHENSIVE METABOLIC PANEL
ALK PHOS: 72 U/L (ref 38–126)
ALT: 16 U/L (ref 14–54)
ANION GAP: 6 (ref 5–15)
AST: 21 U/L (ref 15–41)
Albumin: 3.8 g/dL (ref 3.5–5.0)
BUN: 15 mg/dL (ref 6–20)
CALCIUM: 9.6 mg/dL (ref 8.9–10.3)
CO2: 26 mmol/L (ref 22–32)
Chloride: 105 mmol/L (ref 101–111)
Creatinine, Ser: 0.92 mg/dL (ref 0.44–1.00)
GFR, EST NON AFRICAN AMERICAN: 56 mL/min — AB (ref >60)
Glucose, Bld: 210 mg/dL — ABNORMAL HIGH (ref 65–99)
Potassium: 4.4 mmol/L (ref 3.5–5.1)
SODIUM: 137 mmol/L (ref 135–145)
Total Bilirubin: 0.4 mg/dL (ref 0.3–1.2)
Total Protein: 6.7 g/dL (ref 6.5–8.1)

## 2015-10-25 LAB — CBC WITH DIFFERENTIAL/PLATELET
BASOS ABS: 0 10*3/uL (ref 0–0.1)
BASOS PCT: 1 %
EOS ABS: 0.4 10*3/uL (ref 0–0.7)
Eosinophils Relative: 6 %
HCT: 37.7 % (ref 35.0–47.0)
HEMOGLOBIN: 12.6 g/dL (ref 12.0–16.0)
Lymphocytes Relative: 19 %
Lymphs Abs: 1.3 10*3/uL (ref 1.0–3.6)
MCH: 33.6 pg (ref 26.0–34.0)
MCHC: 33.4 g/dL (ref 32.0–36.0)
MCV: 100.7 fL — ABNORMAL HIGH (ref 80.0–100.0)
Monocytes Absolute: 0.5 10*3/uL (ref 0.2–0.9)
Monocytes Relative: 7 %
NEUTROS PCT: 67 %
Neutro Abs: 4.6 10*3/uL (ref 1.4–6.5)
Platelets: 233 10*3/uL (ref 150–440)
RBC: 3.74 MIL/uL — AB (ref 3.80–5.20)
RDW: 13.5 % (ref 11.5–14.5)
WBC: 6.8 10*3/uL (ref 3.6–11.0)

## 2015-10-25 LAB — TROPONIN I

## 2015-10-25 NOTE — ED Provider Notes (Addendum)
[facility] Emergency Department Provider Note  ____________________________________________   I have reviewed the triage vital signs and the nursing notes.   HISTORY  Chief Complaint Chest Pain    HPI Brenda [name] is a 82 y.o. female resents today complaining of chest wall pain in the right underneath her right breast. She states she noticed it about 36 hours ago. She has trouble sleeping she states and she sometimes tosses and turns at night. She thinks she tossed and turned the wrong way because while she was lying in bed she started having pain on the right side. It was when she was lying on it awkwardly she states. Since that time and the pain is only present if she moves the wrong way touches the right side or moves her arm the wrong direction. She states is especially uncomfortable she lays on her right side in bed. She denies abdominal pain nausea or vomiting she denies any shortness of breath or leg swelling she does not have a history of CAD and she denies any history of PE or DVT in her family.The pain is not exertional. It is only positional and only in certain positions when she is in bed. She denies any cough or nausea or vomiting. She denies any abdominal pain any diarrhea. She denies any rash or fever. She can point to the exact spot where the pain is. I did ask her she was short of breath and she states "all the time for years if one of the things by Drs. going to work on after he gets me better at sleeping". I asked her if she is any more short of breath than normal and she states absolutely not.  Past Medical History  Diagnosis Date  . Arthritis   . Blood in stool   . Chicken pox   . Ulcer   . Hypertension   . Urinary incontinence   . Diabetes mellitus 2000  . Pernicious anemia   . GERD (gastroesophageal reflux disease)   . Hiatal hernia   . Kidney infection 2014  . Spinal stenosis   . Cancer ([place])   . Ovarian cancer ([place]) 1991    s/p  hysterectomy at [facility]    Patient Active Problem List   Diagnosis Date Noted  . Screening for breast cancer 08/18/2015  . B12 deficiency 12/02/2014  . Lumbago 07/09/2014  . Shortness of breath 06/30/2014  . Obese 06/30/2014  . Right facial pain 05/18/2014  . Anemia 01/01/2014  . Osteoarthritis 07/31/2013  . Other malaise and fatigue 07/09/2013  . Muscle cramp, nocturnal 03/25/2013  . Medicare annual wellness visit, subsequent 12/02/2012  . Dermatitis 12/02/2012  . Other and unspecified hyperlipidemia 12/02/2012  . Essential hypertension, benign 12/02/2012  . Chronic low back pain 08/28/2012  . Osteoporosis 07/23/2012  . Paresthesia of bilateral legs 05/30/2012  . Pernicious anemia 05/30/2012  . Diabetes mellitus type 2 with complications ([place]) [id number]  . Depression 05/30/2012  . Insomnia 05/30/2012  . Weakness of both legs 05/30/2012    Past Surgical History  Procedure Laterality Date  . Shoulder surgery  2013    fracture right shoulder  . Foot surgery  2000  . Abdominal hysterectomy      Current Outpatient Rx  Name  Route  Sig  Dispense  Refill  . cholecalciferol (VITAMIN D) 1000 units tablet   Oral   Take 2,000 Units by mouth daily.         . ciprofloxacin (CIPRO) 250 MG tablet  Oral   Take 1 tablet (250 mg total) by mouth every 12 (twelve) hours.   10 tablet   0   . ferrous sulfate 324 (65 FE) MG TBEC   Oral   Take 1 tablet by mouth daily.          . fesoterodine (TOVIAZ) 8 MG TB24   Oral   Take 4 mg by mouth daily.          Marland Kitchen gabapentin (NEURONTIN) 100 MG capsule   Oral   Take 1 capsule (100 mg total) by mouth as needed. Take one capsule by mouth every night at bedtime.   30 capsule   6   . glipiZIDE (GLUCOTROL XL) 10 MG 24 hr tablet      TAKE 1 TABLET BY MOUTH TWICE DAILY   180 tablet   0   . lisinopril (PRINIVIL,ZESTRIL) 10 MG tablet      TAKE 1 TABLET BY MOUTH EVERY DAY   90 tablet   1   . metFORMIN (GLUCOPHAGE) 1000 MG  tablet      TAKE 1 TABLET BY MOUTH TWICE DAILY WITH MEALS   180 tablet   0   . oxybutynin (DITROPAN) 5 MG tablet   Oral   Take 1 tablet (5 mg total) by mouth 3 (three) times daily.   60 tablet   1   . pantoprazole (PROTONIX) 40 MG tablet   Oral   Take 40 mg by mouth 2 (two) times daily.         . sucralfate (CARAFATE) 1 G tablet   Oral   Take 1 g by mouth 2 (two) times daily.         . traZODone (DESYREL) 50 MG tablet   Oral   Take 0.5-1 tablets (25-50 mg total) by mouth at bedtime as needed for sleep.   30 tablet   3   . vitamin B-12 (CYANOCOBALAMIN) 1000 MCG tablet   Oral   Take 1,000 mcg by mouth daily.         . Vitamin D, Ergocalciferol, (DRISDOL) 50000 units CAPS capsule   Oral   Take 50,000 Units by mouth every 7 (seven) days.           Allergies Aspirin and Sulfa antibiotics  Family History  Problem Relation Age of Onset  . Arthritis Mother   . Cancer Mother     colon  . Arthritis Father   . Cancer Father     colon  . Cancer Other     lung  . Cancer Sister     lung    Social History Social History  Substance Use Topics  . Smoking status: Never Smoker   . Smokeless tobacco: None  . Alcohol Use: No    Review of Systems Constitutional: No fever/chills Eyes: No visual changes. ENT: No sore throat. No stiff neck no neck pain Cardiovascular: See history of present illness regarding chest pain. Respiratory: Denies shortness of breath over her mild baseline. Gastrointestinal:   no vomiting.  No diarrhea.  No constipation. Genitourinary: Negative for dysuria. Musculoskeletal: Negative lower extremity swelling Skin: Negative for rash. Neurological: Negative for headaches, focal weakness or numbness. 10-point ROS otherwise negative.  ____________________________________________   PHYSICAL EXAM:  VITAL SIGNS: ED Triage Vitals  Enc Vitals Group     BP 10/25/15 1013 145/71 mmHg     Pulse Rate 10/25/15 1013 76     Resp 10/25/15 1013  20     Temp 10/25/15 1013  98.4 F (36.9 C)     Temp Source 10/25/15 1013 Oral     SpO2 10/25/15 1013 97 %     Weight 10/25/15 1013 207 lb (93.895 kg)     Height 10/25/15 1013 5\' 3"  (1.6 m)     Head Cir --      Peak Flow --      Pain Score 10/25/15 1013 8     Pain Loc --      Pain Edu? --      Excl. in [place]? --     Constitutional: Alert and oriented. Well appearing and in no acute distress. Somewhat anxious but in no acute medical distress speaks in extensive long sentences with no evidence of shortness of breath or significant discomfort Eyes: Conjunctivae are normal. PERRL. EOMI. Head: Atraumatic. Nose: No congestion/rhinnorhea. Mouth/Throat: Mucous membranes are moist.  Oropharynx non-erythematous. Neck: No stridor.   Nontender with no meningismus Chest: Female chaperone present in the room. There is tenderness to palpation to the muscle skeletal region underneath the right breast for the pectoralis muscle is. This pain is evoked with palpation which causes the patient to say "ouch that's the pain right there" and pullback. The pain is also elicitable with ranging of the right arm. There is no shingles noted this region there is no flail chest there is no crepitus. There is normal to appearance. Her is no abdominal pain in the right upper quadrant or anywhere that seems to elicit this discomfort Cardiovascular: Normal rate, regular rhythm. Grossly normal heart sounds.  Good peripheral circulation. Respiratory: Normal respiratory effort.  No retractions. Lungs CTAB. Abdominal: Soft and nontender. No distention. No guarding no rebound Back:  There is no focal tenderness or step off there is no midline tenderness there are no lesions noted. there is no CVA tenderness Musculoskeletal: No lower extremity tenderness. No joint effusions, no DVT signs strong distal pulses no edema Neurologic:  Normal speech and language. No gross focal neurologic deficits are appreciated.  Skin:  Skin is warm,  dry and intact. No rash noted. Psychiatric: Mood and affect are normal. Speech and behavior are normal.  ____________________________________________   LABS (all labs ordered are listed, but only abnormal results are displayed)  Labs Reviewed - No data to display ____________________________________________  EKG  I personally interpreted any EKGs ordered by me or triage Sinus rhythm rate 62 bpm no acute ST elevation or acute ST depression normal axis signal PVC noted ____________________________________________  RADIOLOGY  I reviewed any imaging ordered by me or triage that were performed during my shift ____________________________________________   PROCEDURES  Procedure(s) performed: None  Critical Care performed: None  ____________________________________________   INITIAL IMPRESSION / ASSESSMENT AND PLAN / ED COURSE  Pertinent labs & imaging results that were available during my care of the patient were reviewed by me and considered in my medical decision making (see chart for details).  Patient with a very reproducible chest wall pain which happened while she was tossing and turning in bed 30 hours or more ago. He now only hurts when she touches it or changes position the wrong way. Very low suspicion for ACS given her age we'll check cardiac enzymes. Her troponin not benefit from serial checks given how long she has had this discomfort. Low suspicion for PE ACS myocarditis endocarditis pericarditis or pneumonia or pneumothorax were we'll get a chest x-ray and reassess. I will give her pain medication as she has taken nothing for this discomfort. The patient does have some  mild shortness of breath all the time she states but there is certainly no evidence of hypoxia or dyspnea here and she states that this is not anything different from her baseline however we will evaluate with a chest x-ray. I do not think a CT scan is warranted in this patient depending on what we find,  we certainly could reconsider that if needed.  ----------------------------------------- 1:20 PM on 10/25/2015 -----------------------------------------  Remains comfortable in the emergency department. His body having had pain for 30 hours, patient has negative troponin here. We'll send a second cardiac markers a precaution. At this time she only has discomfort as she changes position or pulls up using that right arm. There is no evidence of damage or pain to the right arm itself this is all chest wall discomfort as initially indicated. If second troponin is negative patient would prefer to go home. She declines further offered pain medication.At this time, there does not appear to be clinical evidence to support the diagnosis of pulmonary embolus, dissection, myocarditis, endocarditis, pericarditis, pericardial tamponade, acute coronary syndrome, pneumothorax, pneumonia, or any other acute intrathoracic pathology that will require admission or acute intervention. Nor is there evidence of any significant intra-abdominal pathology causing this discomfort. ____________________________________________   FINAL CLINICAL IMPRESSION(S) / ED DIAGNOSES  Final diagnoses:  None      This chart was dictated using voice recognition software.  Despite best efforts to proofread,  errors can occur which can change meaning.     Schuyler Amor, MD 10/25/15 [place], MD 10/25/15 1321

## 2015-10-25 NOTE — Discharge Instructions (Signed)

## 2015-10-25 NOTE — ED Notes (Signed)
Pt reports she began having rt sided chest pain Sunday night, pt reports her sob has worsened.

## 2015-10-25 NOTE — ED Notes (Signed)
Patient transported to X-ray 

## 2015-11-04 ENCOUNTER — Encounter: Payer: Self-pay | Admitting: Family Medicine

## 2015-11-04 ENCOUNTER — Ambulatory Visit (INDEPENDENT_AMBULATORY_CARE_PROVIDER_SITE_OTHER): Payer: Medicare Other | Admitting: Family Medicine

## 2015-11-04 VITALS — BP 132/80 | HR 76 | Ht 63.0 in | Wt 205.0 lb

## 2015-11-04 DIAGNOSIS — R0602 Shortness of breath: Secondary | ICD-10-CM | POA: Diagnosis not present

## 2015-11-04 DIAGNOSIS — G4700 Insomnia, unspecified: Secondary | ICD-10-CM | POA: Diagnosis not present

## 2015-11-04 NOTE — Progress Notes (Signed)
Name: Brenda [name]   MRN: [id number]    DOB: 31-Mar-1931   Date:11/04/2015       Progress Note  Subjective  Chief Complaint  Chief Complaint  Patient presents with  . Follow-up    insomnia- starting to sleep better     Shortness of Breath This is a chronic problem. The current episode started more than 1 year ago. The problem occurs daily. The problem has been unchanged. Pertinent negatives include no abdominal pain, chest pain, claudication, coryza, ear pain, fever, headaches, hemoptysis, leg pain, leg swelling, neck pain, orthopnea, PND, rash, rhinorrhea, sore throat, sputum production, swollen glands, syncope, vomiting or wheezing. The symptoms are aggravated by exercise and any activity. She has tried nothing for the symptoms.  Insomnia Primary symptoms: no fragmented sleep, no malaise/fatigue.  The problem occurs nightly. The problem has been gradually improving since onset. PMH includes: no depression. Prior diagnostic workup includes:  No prior workup.    No problem-specific assessment & plan notes found for this encounter.   Past Medical History  Diagnosis Date  . Arthritis   . Blood in stool   . Chicken pox   . Ulcer   . Hypertension   . Urinary incontinence   . Diabetes mellitus 2000  . Pernicious anemia   . GERD (gastroesophageal reflux disease)   . Hiatal hernia   . Kidney infection 2014  . Spinal stenosis   . Cancer ([place])   . Ovarian cancer ([place]) 1991    s/p hysterectomy at [facility]    Past Surgical History  Procedure Laterality Date  . Shoulder surgery  2013    fracture right shoulder  . Foot surgery  2000  . Abdominal hysterectomy      Family History  Problem Relation Age of Onset  . Arthritis Mother   . Cancer Mother     colon  . Arthritis Father   . Cancer Father     colon  . Cancer Other     lung  . Cancer Sister     lung    Social History   Social History  . Marital Status: Widowed    Spouse Name: N/A  . Number of Children: N/A  .  Years of Education: N/A   Occupational History  . Not on file.   Social History Main Topics  . Smoking status: Never Smoker   . Smokeless tobacco: Not on file  . Alcohol Use: No  . Drug Use: No  . Sexual Activity: No   Other Topics Concern  . Not on file   Social History Narrative   Lives alone in [place]. 1 daughter living in [place]. Son died lung cancer.    Allergies  Allergen Reactions  . Aspirin Other (See Comments)    bleeding  . Sulfa Antibiotics Swelling     Review of Systems  Constitutional: Negative for fever, chills, weight loss and malaise/fatigue.  HENT: Negative for ear discharge, ear pain, rhinorrhea and sore throat.   Eyes: Negative for blurred vision.  Respiratory: Positive for shortness of breath. Negative for cough, hemoptysis, sputum production and wheezing.   Cardiovascular: Negative for chest pain, palpitations, orthopnea, claudication, leg swelling, syncope and PND.  Gastrointestinal: Negative for heartburn, nausea, vomiting, abdominal pain, diarrhea, constipation, blood in stool and melena.  Genitourinary: Negative for dysuria, urgency, frequency and hematuria.  Musculoskeletal: Negative for myalgias, back pain, joint pain and neck pain.  Skin: Negative for rash.  Neurological: Negative for dizziness, tingling, sensory change, focal weakness and  headaches.  Endo/Heme/Allergies: Negative for environmental allergies and polydipsia. Does not bruise/bleed easily.  Psychiatric/Behavioral: Negative for depression and suicidal ideas. The patient has insomnia. The patient is not nervous/anxious.      Objective  Filed Vitals:   11/04/15 1335  BP: 132/80  Pulse: 76  Height: 5\' 3"  (1.6 m)  Weight: 205 lb (92.987 kg)    Physical Exam  Constitutional: She is well-developed, well-nourished, and in no distress. No distress.  HENT:  Head: Normocephalic and atraumatic.  Right Ear: External ear normal.  Left Ear: External ear normal.  Nose: Nose normal.   Mouth/Throat: Oropharynx is clear and moist.  Eyes: Conjunctivae and EOM are normal. Pupils are equal, round, and reactive to light. Right eye exhibits no discharge. Left eye exhibits no discharge.  Neck: Normal range of motion. Neck supple. No JVD present. No thyromegaly present.  Cardiovascular: Normal rate, regular rhythm, normal heart sounds and intact distal pulses.  Exam reveals no gallop and no friction rub.   No murmur heard. Pulmonary/Chest: Effort normal and breath sounds normal.  Abdominal: Soft. Bowel sounds are normal. She exhibits no mass. There is no tenderness. There is no guarding.  Musculoskeletal: Normal range of motion. She exhibits no edema.  Lymphadenopathy:    She has no cervical adenopathy.  Neurological: She is alert.  Skin: Skin is warm and dry. She is not diaphoretic.  Psychiatric: Mood and affect normal.  Nursing note and vitals reviewed.     Assessment & Plan  Problem List Items Addressed This Visit      Other   Insomnia   Shortness of breath - Primary   Relevant Orders   Ambulatory referral to Pulmonology        Dr. Otilio Miu [facility]  11/04/2015

## 2015-11-10 ENCOUNTER — Encounter: Payer: Self-pay | Admitting: Pulmonary Disease

## 2015-11-10 ENCOUNTER — Ambulatory Visit (INDEPENDENT_AMBULATORY_CARE_PROVIDER_SITE_OTHER): Payer: Medicare Other | Admitting: Pulmonary Disease

## 2015-11-10 VITALS — BP 132/88 | HR 86 | Ht 63.0 in | Wt 208.0 lb

## 2015-11-10 DIAGNOSIS — R5381 Other malaise: Secondary | ICD-10-CM

## 2015-11-10 DIAGNOSIS — R0600 Dyspnea, unspecified: Secondary | ICD-10-CM

## 2015-11-10 NOTE — Progress Notes (Signed)
PULMONARY CONSULT NOTE  Requesting MD/Service: Otilio Miu, MD Date of initial consultation: 11/10/15 Reason for consultation: DOE  HPI:  28 F referred for evaluation of disabling dyspnea. She notes class II dyspnea (dyspnea with simple ADLs) for at least a year that is slowly progressive. She also has occasional R sided CP that is not necessarily associated with dyspnea or exertion. There is little DTD variation and no seasonal component. She has never smoked. She denies significant cough, sputum, LE edema. She has never undergone a focused pulmonary evaluation in the past. She has never smoked. 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  Approx 6 years ago she underwent a sleep study and based on the results of that study, she was prescribed CPAP which she was totally unable to tolerate. Her chief sleep complaint is insomnia and she denies heavy snoring or excessive daytime somnolence.   Past Medical History  Diagnosis Date  . Arthritis   . Blood in stool   . Chicken pox   . Ulcer   . Hypertension   . Urinary incontinence   . Diabetes mellitus 2000  . Pernicious anemia   . GERD (gastroesophageal reflux disease)   . Hiatal hernia   . Kidney infection 2014  . Spinal stenosis   . Cancer ([place])   . Ovarian cancer ([place]) 1991    s/p hysterectomy at [facility]    Past Surgical History  Procedure Laterality Date  . Shoulder surgery  2013    fracture right shoulder  . Foot surgery  2000  . Abdominal hysterectomy      MEDICATIONS: I have reviewed all medications and confirmed regimen as documented  Social History   Social History  . Marital Status: Widowed    Spouse Name: N/A  . Number of Children: N/A  . Years of Education: N/A   Occupational History  . Not on file.   Social History Main Topics  . Smoking status: Never Smoker   . Smokeless tobacco: Not on file  . Alcohol Use: No  . Drug Use: No  . Sexual Activity: No   Other Topics Concern   . Not on file   Social History Narrative   Lives alone in [place]. 1 daughter living in [place]. Son died lung cancer.    Family History  Problem Relation Age of Onset  . Arthritis Mother   . Cancer Mother     colon  . Arthritis Father   . Cancer Father     colon  . Cancer Other     lung  . Cancer Sister     lung    ROS: No fever, myalgias/arthralgias, unexplained weight loss or weight gain No new focal weakness or sensory deficits No otalgia, hearing loss, visual changes, nasal and sinus symptoms, mouth and throat problems No neck pain or adenopathy No abdominal pain, N/V/D, diarrhea, change in bowel pattern No dysuria, change in urinary pattern No LE edema or calf tenderness   Filed Vitals:   11/10/15 1031  BP: 132/88  Pulse: 86  Height: 5\' 3"  (1.6 m)  Weight: 208 lb (94.348 kg)  SpO2: 96%     EXAM:  Gen: Obese No overt respiratory distress, very weak - extreme difficulty stepping up to exam table HEENT: NCAT, TMs and canals normal, sclera white, nares and nasal mucosa normal, oropharynx normal Neck: Supple without LAN, thyromegaly, JVD Lungs: breath sounds full, percussion note normal throughout, No adventitious sounds Cardiovascular: Normal rate, reg rhythm, no murmurs noted  Abdomen: Soft, nontender, normal BS Ext: without clubbing, cyanosis, edema Neuro: CNs grossly intact, motor and sensory intact, DTRs symmetric Skin: Limited exam, no lesions noted  DATA:   BMP Latest Ref Rng 10/25/2015 08/18/2015 05/02/2015  Glucose 65 - 99 mg/dL 210(H) 109(H) 181(H)  BUN 6 - 20 mg/dL 15 21 19   Creatinine 0.44 - 1.00 mg/dL 0.92 0.92 1.00  Sodium 135 - 145 mmol/L 137 138 135  Potassium 3.5 - 5.1 mmol/L 4.4 4.3 4.1  Chloride 101 - 111 mmol/L 105 104 100(L)  CO2 22 - 32 mmol/L 26 26 25   Calcium 8.9 - 10.3 mg/dL 9.6 10.3 8.8(L)    CBC Latest Ref Rng 10/25/2015 05/02/2015 02/16/2015  WBC 3.6 - 11.0 K/uL 6.8 6.4 5.7  Hemoglobin 12.0 - 16.0 g/dL 12.6 11.3(L) 11.6(L)   Hematocrit 35.0 - 47.0 % 37.7 33.4(L) 35.2(L)  Platelets 150 - 440 K/uL 233 263 243.0    CXR (22/14/17): NACPD  TTE (07/15/14): LVEF 60-65%. Grade I diastolic dysfunction  EKG 10/28/15: PRWP    IMPRESSION:   Disabling dyspnea without evidence of a primary pulmonary problem thus far. I suspect her dyspnea is primarily on the basis of obesity and deconditioning noting profound LE weakness on exam. It is possible that there is a cardiac component as well but I note a normal myoview in 2015   PLAN:  We discussed wt loss strategies - in particular, restricting sugar in the diet and simple carbohydrates ROV 4-6 wks with PFTs. If these are unrevealing, it might be reasonable to repeat the stress test as er symptoms are new since the last study   Brenda Mcardle, MD [facility], Critical Care Medicine

## 2015-11-22 ENCOUNTER — Ambulatory Visit (INDEPENDENT_AMBULATORY_CARE_PROVIDER_SITE_OTHER): Payer: Medicare Other

## 2015-11-22 DIAGNOSIS — E538 Deficiency of other specified B group vitamins: Secondary | ICD-10-CM

## 2015-11-22 MED ORDER — CYANOCOBALAMIN 1000 MCG/ML IJ SOLN
1000.0000 ug | Freq: Once | INTRAMUSCULAR | Status: AC
  Administered 2015-11-22: 1000 ug via INTRAMUSCULAR

## 2015-12-13 ENCOUNTER — Ambulatory Visit (INDEPENDENT_AMBULATORY_CARE_PROVIDER_SITE_OTHER): Payer: Medicare Other | Admitting: *Deleted

## 2015-12-13 DIAGNOSIS — R0600 Dyspnea, unspecified: Secondary | ICD-10-CM | POA: Diagnosis not present

## 2015-12-13 LAB — PULMONARY FUNCTION TEST
DL/VA % pred: 93 %
DL/VA: 4.37 ml/min/mmHg/L
DLCO UNC % PRED: 72 %
DLCO UNC: 16.59 ml/min/mmHg
FEF 25-75 PRE: 1.4 L/s
FEF 25-75 Post: 2.63 L/sec
FEF2575-%CHANGE-POST: 87 %
FEF2575-%PRED-PRE: 114 %
FEF2575-%Pred-Post: 215 %
FEV1-%CHANGE-POST: 17 %
FEV1-%PRED-POST: 102 %
FEV1-%Pred-Pre: 87 %
FEV1-POST: 1.8 L
FEV1-Pre: 1.53 L
FEV1FVC-%Change-Post: 4 %
FEV1FVC-%Pred-Pre: 107 %
FEV6-%CHANGE-POST: 12 %
FEV6-%PRED-POST: 97 %
FEV6-%Pred-Pre: 87 %
FEV6-POST: 2.19 L
FEV6-PRE: 1.95 L
FEV6FVC-%PRED-POST: 106 %
FEV6FVC-%PRED-PRE: 106 %
FVC-%CHANGE-POST: 12 %
FVC-%Pred-Post: 92 %
FVC-%Pred-Pre: 82 %
FVC-Post: 2.19 L
FVC-Pre: 1.95 L
POST FEV1/FVC RATIO: 82 %
POST FEV6/FVC RATIO: 100 %
PRE FEV6/FVC RATIO: 100 %
Pre FEV1/FVC ratio: 79 %
RV % PRED: 106 %
RV: 2.55 L
TLC % PRED: 94 %
TLC: 4.64 L

## 2015-12-13 NOTE — Progress Notes (Signed)
PFT performed today. 

## 2015-12-16 ENCOUNTER — Ambulatory Visit (INDEPENDENT_AMBULATORY_CARE_PROVIDER_SITE_OTHER): Payer: Medicare Other | Admitting: Pulmonary Disease

## 2015-12-16 ENCOUNTER — Encounter: Payer: Self-pay | Admitting: Pulmonary Disease

## 2015-12-16 VITALS — BP 130/72 | HR 81 | Ht 63.0 in | Wt 203.2 lb

## 2015-12-16 DIAGNOSIS — R0600 Dyspnea, unspecified: Secondary | ICD-10-CM | POA: Diagnosis not present

## 2015-12-16 DIAGNOSIS — E669 Obesity, unspecified: Secondary | ICD-10-CM

## 2015-12-16 DIAGNOSIS — J4530 Mild persistent asthma, uncomplicated: Secondary | ICD-10-CM

## 2015-12-16 MED ORDER — FLUTICASONE FUROATE-VILANTEROL 100-25 MCG/INH IN AEPB: 1.0000 | INHALATION_SPRAY | Freq: Every day | RESPIRATORY_TRACT | Status: AC

## 2015-12-16 MED ORDER — FLUTICASONE FUROATE-VILANTEROL 100-25 MCG/INH IN AEPB: 1.0000 | INHALATION_SPRAY | Freq: Every day | RESPIRATORY_TRACT | Status: DC

## 2015-12-16 NOTE — Patient Instructions (Signed)
Begin trial of Breo inhaler. Stay on this medication until you see me in the office again. Then we will decide whether it has helped enough to continue it Follow up in 6-8 weeks

## 2015-12-18 NOTE — Progress Notes (Signed)
PULMONARY OFFICE FOLLOW UP NOTE  Requesting MD/Service: Otilio Miu, MD Date of initial consultation: 11/10/15 Reason for consultation: DOE  Initial HPI (11/10/15):  42 F referred for evaluation of disabling dyspnea. She notes class II dyspnea (dyspnea with simple ADLs) for at least a year that is slowly progressive. She also has occasional R sided CP that is not necessarily associated with dyspnea or exertion. There is little DTD variation and no seasonal component. She has never smoked. She denies significant cough, sputum, LE edema. She has never undergone a focused pulmonary evaluation in the past. She has never smoked. 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 Approx 6 years ago she underwent a sleep study and based on the results of that study, she was prescribed CPAP which she was totally unable to tolerate. Her chief sleep complaint is insomnia and she denies heavy snoring or excessive daytime somnolence.  Initial IMP/PLAN: Disabling dyspnea without evidence of a primary pulmonary problem thus far. I suspect her dyspnea is primarily on the basis of obesity and deconditioning noting profound LE weakness on exam. It is possible that there is a cardiac component as well but I note a normal myoview in 2015. PLAN: We discussed wt loss strategies - in particular, restricting sugar in the diet and simple carbohydrates. ROV 4-6 wks with PFTs. If these are unrevealing, it might be reasonable to repeat the stress test as er symptoms are new since the last study  ROV 12/16/15: No change in symptoms. Follow up to discuss PFTs. PFTs (12/13/15): No definite obstruction, lung volumes normal, DLCO mildly reduced but corrects for VA. 17% improvement in FEV1 after bronchodilator   Filed Vitals:   12/16/15 1005  BP: 130/72  Pulse: 81  Height: 5\' 3"  (1.6 m)  Weight: 203 lb 3.2 oz (92.171 kg)  SpO2: 97%     EXAM:  Gen: Obese No overt respiratory distress HEENT: NCAT,  sclera white, oropharynx normal Neck: Supple without LAN Lungs: breath sounds full, percussion note normal throughout, No wheezes Cardiovascular: Normal rate, reg rhythm, no murmurs noted Abdomen: Obese, soft, nontender, normal BS Ext: without clubbing, cyanosis, edema Neuro: grossly intact  DATA:  PFTs 12/13/15: as above   IMPRESSION:   1) Disabling dyspnea - still suspect mostly on basis of obesity and deconditioning 2) Possible asthma based on 17% improvement in FEV1 on PFTs   PLAN:  1) again emphasized importance of weight loss 2) Trial Breo inhaler - one inhalation daily 3) ROV 6-8 weeks  Merton Border, MD PCCM service Mobile [phone] Pager [phone] 12/19/2015

## 2016-01-05 ENCOUNTER — Ambulatory Visit (INDEPENDENT_AMBULATORY_CARE_PROVIDER_SITE_OTHER): Payer: Medicare Other

## 2016-01-05 DIAGNOSIS — E538 Deficiency of other specified B group vitamins: Secondary | ICD-10-CM | POA: Diagnosis not present

## 2016-01-05 MED ORDER — CYANOCOBALAMIN 1000 MCG/ML IJ SOLN
1000.0000 ug | Freq: Once | INTRAMUSCULAR | Status: AC
  Administered 2016-01-05: 1000 ug via INTRAMUSCULAR

## 2016-01-13 ENCOUNTER — Other Ambulatory Visit: Payer: Self-pay | Admitting: Internal Medicine

## 2016-01-23 ENCOUNTER — Ambulatory Visit (INDEPENDENT_AMBULATORY_CARE_PROVIDER_SITE_OTHER): Payer: Medicare Other | Admitting: Family Medicine

## 2016-01-23 ENCOUNTER — Encounter: Payer: Self-pay | Admitting: Family Medicine

## 2016-01-23 VITALS — BP 110/62 | HR 60 | Ht 63.0 in | Wt 199.0 lb

## 2016-01-23 DIAGNOSIS — N342 Other urethritis: Secondary | ICD-10-CM | POA: Diagnosis not present

## 2016-01-23 LAB — POCT URINALYSIS DIPSTICK
BILIRUBIN UA: NEGATIVE
Glucose, UA: NEGATIVE
Ketones, UA: NEGATIVE
Leukocytes, UA: NEGATIVE
NITRITE UA: NEGATIVE
PH UA: 6
Protein, UA: NEGATIVE
RBC UA: NEGATIVE
SPEC GRAV UA: 1.025
UROBILINOGEN UA: 0.2

## 2016-01-23 MED ORDER — CIPROFLOXACIN HCL 250 MG PO TABS: 250.0000 mg | ORAL_TABLET | Freq: Two times a day (BID) | ORAL | Status: DC

## 2016-01-23 NOTE — Progress Notes (Signed)
Name: Brenda [name]   MRN: [id number]    DOB: [birth year]   Date:01/23/2016       Progress Note  Subjective  Chief Complaint  Chief Complaint  Patient presents with  . Urinary Frequency    takes vesicare "off and on"    Urinary Frequency  This is a new problem. The current episode started in the past 7 days. The problem occurs intermittently. The problem has been waxing and waning. The quality of the pain is described as burning. The patient is experiencing no pain. There has been no fever. Associated symptoms include frequency and urgency. Pertinent negatives include no chills, discharge, hematuria, hesitancy or nausea. Treatments tried: not vesicare. The treatment provided no relief.    No problem-specific assessment & plan notes found for this encounter.   Past Medical History  Diagnosis Date  . Arthritis   . Blood in stool   . Chicken pox   . Ulcer   . Hypertension   . Urinary incontinence   . Diabetes mellitus 2000  . Pernicious anemia   . GERD (gastroesophageal reflux disease)   . Hiatal hernia   . Kidney infection 2014  . Spinal stenosis   . Cancer ([place])   . Ovarian cancer ([place]) 1991    s/p hysterectomy at [facility]    Past Surgical History  Procedure Laterality Date  . Shoulder surgery  2013    fracture right shoulder  . Foot surgery  2000  . Abdominal hysterectomy      Family History  Problem Relation Age of Onset  . Arthritis Mother   . Cancer Mother     colon  . Arthritis Father   . Cancer Father     colon  . Cancer Other     lung  . Cancer Sister     lung    Social History   Social History  . Marital Status: Widowed    Spouse Name: N/A  . Number of Children: N/A  . Years of Education: N/A   Occupational History  . Not on file.   Social History Main Topics  . Smoking status: Never Smoker   . Smokeless tobacco: Not on file  . Alcohol Use: No  . Drug Use: No  . Sexual Activity: No   Other Topics Concern  . Not on file   Social History  Narrative   Lives alone in [place]. 1 daughter living in [place]. Son died lung cancer.    Allergies  Allergen Reactions  . Aspirin Other (See Comments)    bleeding  . Sulfa Antibiotics Swelling     Review of Systems  Constitutional: Negative for fever, chills, weight loss and malaise/fatigue.  HENT: Negative for ear discharge, ear pain and sore throat.   Eyes: Negative for blurred vision.  Respiratory: Negative for cough, sputum production, shortness of breath and wheezing.   Cardiovascular: Negative for chest pain, palpitations and leg swelling.  Gastrointestinal: Negative for heartburn, nausea, abdominal pain, diarrhea, constipation, blood in stool and melena.  Genitourinary: Positive for urgency and frequency. Negative for dysuria, hesitancy and hematuria.  Musculoskeletal: Negative for myalgias, back pain, joint pain and neck pain.  Skin: Negative for rash.  Neurological: Negative for dizziness, tingling, sensory change, focal weakness and headaches.  Endo/Heme/Allergies: Negative for environmental allergies and polydipsia. Does not bruise/bleed easily.  Psychiatric/Behavioral: Negative for depression and suicidal ideas. The patient is not nervous/anxious and does not have insomnia.      Objective  Filed Vitals:   01/23/16  1054  BP: 110/62  Pulse: 60  Height: 5\' 3"  (1.6 m)  Weight: 199 lb (90.266 kg)    Physical Exam  Constitutional: She is well-developed, well-nourished, and in no distress. No distress.  HENT:  Head: Normocephalic and atraumatic.  Right Ear: External ear normal.  Left Ear: External ear normal.  Nose: Nose normal.  Mouth/Throat: Oropharynx is clear and moist.  Eyes: Conjunctivae and EOM are normal. Pupils are equal, round, and reactive to light. Right eye exhibits no discharge. Left eye exhibits no discharge.  Neck: Normal range of motion. Neck supple. No JVD present. No thyromegaly present.  Cardiovascular: Normal rate, regular rhythm, normal heart  sounds and intact distal pulses.  Exam reveals no gallop and no friction rub.   No murmur heard. Pulmonary/Chest: Effort normal and breath sounds normal.  Abdominal: Soft. Bowel sounds are normal. She exhibits no mass. There is no tenderness. There is no guarding.  Musculoskeletal: Normal range of motion. She exhibits no edema.  Lymphadenopathy:    She has no cervical adenopathy.  Neurological: She is alert. She has normal reflexes.  Skin: Skin is warm and dry. She is not diaphoretic.  Psychiatric: Mood and affect normal.  Nursing note and vitals reviewed.     Assessment & Plan  Problem List Items Addressed This Visit    None    Visit Diagnoses    Urethritis    -  Primary    Relevant Medications    ciprofloxacin (CIPRO) 250 MG tablet    Other Relevant Orders    POCT Urinalysis Dipstick (Completed)         Dr. [facility]  01/23/2016

## 2016-02-08 ENCOUNTER — Encounter: Payer: Self-pay | Admitting: Pulmonary Disease

## 2016-02-08 ENCOUNTER — Ambulatory Visit (INDEPENDENT_AMBULATORY_CARE_PROVIDER_SITE_OTHER): Payer: Medicare Other | Admitting: Pulmonary Disease

## 2016-02-08 VITALS — BP 132/62 | HR 95 | Ht 63.0 in | Wt 201.0 lb

## 2016-02-08 DIAGNOSIS — R0600 Dyspnea, unspecified: Secondary | ICD-10-CM

## 2016-02-08 DIAGNOSIS — J4530 Mild persistent asthma, uncomplicated: Secondary | ICD-10-CM | POA: Diagnosis not present

## 2016-02-08 MED ORDER — FLUTICASONE FUROATE-VILANTEROL 100-25 MCG/INH IN AEPB: 1.0000 | INHALATION_SPRAY | Freq: Every day | RESPIRATORY_TRACT | Status: AC

## 2016-02-08 NOTE — Addendum Note (Signed)
Addended by: Maryanna Shape A on: 02/08/2016 01:40 PM   Modules accepted: Orders

## 2016-02-08 NOTE — Progress Notes (Signed)
PULMONARY OFFICE FOLLOW UP NOTE  Requesting MD/Service: Otilio Miu, MD Date of initial consultation: 11/10/15 Reason for consultation: DOE  Initial HPI (11/10/15):  89 F referred for evaluation of disabling dyspnea. She notes class II dyspnea (dyspnea with simple ADLs) for at least a year that is slowly progressive. She also has occasional R sided CP that is not necessarily associated with dyspnea or exertion. There is little DTD variation and no seasonal component. She has never smoked. She denies significant cough, sputum, LE edema. She has never undergone a focused pulmonary evaluation in the past. She has never smoked. 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 Approx 6 years ago she underwent a sleep study and based on the results of that study, she was prescribed CPAP which she was totally unable to tolerate. Her chief sleep complaint is insomnia and she denies heavy snoring or excessive daytime somnolence.  Initial IMP/PLAN: Disabling dyspnea without evidence of a primary pulmonary problem thus far. I suspect her dyspnea is primarily on the basis of obesity and deconditioning noting profound LE weakness on exam. It is possible that there is a cardiac component as well but I note a normal myoview in 2015. PLAN: We discussed wt loss strategies - in particular, restricting sugar in the diet and simple carbohydrates. ROV 4-6 wks with PFTs. If these are unrevealing, it might be reasonable to repeat the stress test as er symptoms are new since the last study  ROV 12/16/15: No change in symptoms. Follow up to discuss PFTs. PFTs (12/13/15): No definite obstruction, lung volumes normal, DLCO mildly reduced but corrects for VA. 17% improvement in FEV1 after bronchodilator  ROV 02/08/16: no new complaints. Has benefited from [place]. Still with moderate dyspnea more attributable to obesity  Filed Vitals:   02/08/16 1318  BP: 132/62  Pulse: 95  Height: 5\' 3"  (1.6 m)   Weight: 201 lb (91.173 kg)  SpO2: 96%     EXAM:  Gen: Obese No overt respiratory distress HEENT: NCAT, sclera white, oropharynx normal Neck: Supple without LAN Lungs: breath sounds full, No wheezes Cardiovascular: Normal rate, reg rhythm, no murmurs noted Abdomen: Obese, soft, nontender, normal BS Ext: without clubbing, cyanosis, edema  DATA:  No new CXR or PFTs   IMPRESSION:   1) Asthma - mild persistent. Favorable response to Breo 2) Dyspnea - still suspect mostly on basis of obesity and deconditioning now   PLAN:  1) Cont Breo inhaler - one inhalation daily 3) ROV 6 months or PRN  Merton Border, MD PCCM service Mobile [phone] Pager [phone] 02/08/2016

## 2016-02-17 ENCOUNTER — Ambulatory Visit (INDEPENDENT_AMBULATORY_CARE_PROVIDER_SITE_OTHER): Payer: Medicare Other

## 2016-02-17 DIAGNOSIS — E538 Deficiency of other specified B group vitamins: Secondary | ICD-10-CM

## 2016-02-17 MED ORDER — CYANOCOBALAMIN 1000 MCG/ML IJ SOLN
1000.0000 ug | Freq: Once | INTRAMUSCULAR | Status: AC
  Administered 2016-02-17: 1000 ug via INTRAMUSCULAR

## 2016-03-02 DIAGNOSIS — E1142 Type 2 diabetes mellitus with diabetic polyneuropathy: Secondary | ICD-10-CM | POA: Diagnosis not present

## 2016-03-05 ENCOUNTER — Other Ambulatory Visit: Payer: Self-pay

## 2016-03-08 DIAGNOSIS — E669 Obesity, unspecified: Secondary | ICD-10-CM | POA: Diagnosis not present

## 2016-03-08 DIAGNOSIS — E1129 Type 2 diabetes mellitus with other diabetic kidney complication: Secondary | ICD-10-CM | POA: Diagnosis not present

## 2016-03-08 DIAGNOSIS — E559 Vitamin D deficiency, unspecified: Secondary | ICD-10-CM | POA: Diagnosis not present

## 2016-03-08 DIAGNOSIS — E1142 Type 2 diabetes mellitus with diabetic polyneuropathy: Secondary | ICD-10-CM | POA: Diagnosis not present

## 2016-03-08 DIAGNOSIS — E538 Deficiency of other specified B group vitamins: Secondary | ICD-10-CM | POA: Diagnosis not present

## 2016-03-08 DIAGNOSIS — E1165 Type 2 diabetes mellitus with hyperglycemia: Secondary | ICD-10-CM | POA: Diagnosis not present

## 2016-03-08 DIAGNOSIS — R809 Proteinuria, unspecified: Secondary | ICD-10-CM | POA: Diagnosis not present

## 2016-03-20 ENCOUNTER — Other Ambulatory Visit: Payer: Self-pay

## 2016-03-20 MED ORDER — METFORMIN HCL 1000 MG PO TABS: 1000.0000 mg | ORAL_TABLET | Freq: Two times a day (BID) | ORAL | Status: DC

## 2016-03-20 MED ORDER — GABAPENTIN 100 MG PO CAPS: 100.0000 mg | ORAL_CAPSULE | ORAL | Status: DC | PRN

## 2016-03-29 ENCOUNTER — Other Ambulatory Visit: Payer: Self-pay

## 2016-03-29 MED ORDER — LISINOPRIL 10 MG PO TABS: 10.0000 mg | ORAL_TABLET | Freq: Every day | ORAL | Status: DC

## 2016-03-29 NOTE — Telephone Encounter (Signed)
Patient would like a refill on her Lisinopril 10mg .

## 2016-04-11 ENCOUNTER — Ambulatory Visit (INDEPENDENT_AMBULATORY_CARE_PROVIDER_SITE_OTHER): Payer: Medicare Other | Admitting: Family Medicine

## 2016-04-11 ENCOUNTER — Encounter: Payer: Self-pay | Admitting: Family Medicine

## 2016-04-11 VITALS — BP 122/78 | HR 78 | Ht 63.0 in | Wt 201.0 lb

## 2016-04-11 DIAGNOSIS — D509 Iron deficiency anemia, unspecified: Secondary | ICD-10-CM

## 2016-04-11 DIAGNOSIS — E538 Deficiency of other specified B group vitamins: Secondary | ICD-10-CM | POA: Diagnosis not present

## 2016-04-11 DIAGNOSIS — K219 Gastro-esophageal reflux disease without esophagitis: Secondary | ICD-10-CM | POA: Diagnosis not present

## 2016-04-11 DIAGNOSIS — D510 Vitamin B12 deficiency anemia due to intrinsic factor deficiency: Secondary | ICD-10-CM | POA: Diagnosis not present

## 2016-04-11 DIAGNOSIS — E118 Type 2 diabetes mellitus with unspecified complications: Secondary | ICD-10-CM

## 2016-04-11 DIAGNOSIS — E559 Vitamin D deficiency, unspecified: Secondary | ICD-10-CM | POA: Diagnosis not present

## 2016-04-11 DIAGNOSIS — I10 Essential (primary) hypertension: Secondary | ICD-10-CM

## 2016-04-11 DIAGNOSIS — J449 Chronic obstructive pulmonary disease, unspecified: Secondary | ICD-10-CM | POA: Diagnosis not present

## 2016-04-11 MED ORDER — PANTOPRAZOLE SODIUM 40 MG PO TBEC: 40.0000 mg | DELAYED_RELEASE_TABLET | Freq: Every day | ORAL | 1 refills | Status: DC

## 2016-04-11 MED ORDER — GABAPENTIN 100 MG PO CAPS: 100.0000 mg | ORAL_CAPSULE | ORAL | 1 refills | Status: DC | PRN

## 2016-04-11 MED ORDER — GLIPIZIDE ER 10 MG PO TB24: 10.0000 mg | ORAL_TABLET | Freq: Two times a day (BID) | ORAL | 1 refills | Status: DC

## 2016-04-11 MED ORDER — FERROUS SULFATE 324 (65 FE) MG PO TBEC: 1.0000 | DELAYED_RELEASE_TABLET | Freq: Every day | ORAL | 1 refills | Status: AC

## 2016-04-11 MED ORDER — METFORMIN HCL 1000 MG PO TABS: 1000.0000 mg | ORAL_TABLET | Freq: Two times a day (BID) | ORAL | 1 refills | Status: AC

## 2016-04-11 MED ORDER — CYANOCOBALAMIN 1000 MCG/ML IJ SOLN
1000.0000 ug | Freq: Once | INTRAMUSCULAR | Status: AC
  Administered 2016-04-11: 1000 ug via INTRAMUSCULAR

## 2016-04-11 MED ORDER — VITAMIN D (ERGOCALCIFEROL) 1.25 MG (50000 UNIT) PO CAPS: 50000.0000 [IU] | ORAL_CAPSULE | ORAL | 1 refills | Status: DC

## 2016-04-11 MED ORDER — FLUTICASONE FUROATE-VILANTEROL 100-25 MCG/INH IN AEPB: 1.0000 | INHALATION_SPRAY | Freq: Every day | RESPIRATORY_TRACT | 1 refills | Status: DC

## 2016-04-11 MED ORDER — LISINOPRIL 10 MG PO TABS: 10.0000 mg | ORAL_TABLET | Freq: Every day | ORAL | 1 refills | Status: DC

## 2016-04-11 NOTE — Progress Notes (Signed)
Name: Brenda [name]   MRN: [id number]    DOB: 1931/03/25   Date:04/11/2016       Progress Note  Subjective  Chief Complaint  Chief Complaint  Patient presents with  . Anemia  . Diabetes  . Hypertension  . Gastroesophageal Reflux  . Peripheral Neuropathy    Anemia  Presents for follow-up visit. There has been no abdominal pain, anorexia, bruising/bleeding easily, confusion, fever, leg swelling, light-headedness, malaise/fatigue, pallor, palpitations, paresthesias, pica or weight loss. Signs of blood loss that are not present include hematemesis, hematochezia, melena, menorrhagia and vaginal bleeding. There is no history of chronic renal disease or heart failure. There are no compliance problems.  Side effects of medications include fatigue.  Diabetes  She presents for her follow-up diabetic visit. She has type 2 diabetes mellitus. Her disease course has been stable. Pertinent negatives for hypoglycemia include no confusion, dizziness, headaches, hunger, mood changes, nervousness/anxiousness, pallor, seizures, sleepiness, speech difficulty, sweats or tremors. Pertinent negatives for diabetes include no blurred vision, no chest pain, no fatigue, no foot paresthesias, no foot ulcerations, no polydipsia, no polyphagia, no polyuria, no visual change, no weakness and no weight loss. Symptoms are stable. Pertinent negatives for diabetic complications include no CVA, nephropathy or PVD. There are no known risk factors for coronary artery disease. Current diabetic treatment includes oral agent (dual therapy). She participates in exercise intermittently. Her breakfast blood glucose is taken between 7-8 am. Her breakfast blood glucose range is generally 110-130 mg/dl. Her highest blood glucose is 110-130 mg/dl. An ACE inhibitor/angiotensin II receptor blocker is being taken. She does not see a podiatrist.Eye exam is current.  Hypertension  This is a chronic problem. The current episode started more than 1 year  ago. The problem has been waxing and waning since onset. The problem is controlled. Associated symptoms include shortness of breath. Pertinent negatives include no blurred vision, chest pain, headaches, malaise/fatigue, palpitations or sweats. There are no associated agents to hypertension. Risk factors for coronary artery disease include diabetes mellitus, dyslipidemia, obesity and post-menopausal state. There is no history of CAD/MI, CVA, heart failure, left ventricular hypertrophy, PVD or renovascular disease. There is no history of chronic renal disease or a hypertension causing med.  Gastroesophageal Reflux  She reports no abdominal pain, no chest pain, no coughing, no heartburn, no nausea or no wheezing. This is a chronic problem. The problem occurs frequently. The problem has been gradually improving. Pertinent negatives include no fatigue, melena or weight loss. She has tried a PPI for the symptoms. The treatment provided mild relief.  Shortness of Breath  This is a chronic problem. The current episode started more than 1 year ago. The problem occurs intermittently. The problem has been waxing and waning. Pertinent negatives include no abdominal pain, chest pain, fever, headaches or wheezing. She has tried beta agonist inhalers and steroid inhalers for the symptoms. There is no history of a heart failure.    No problem-specific Assessment & Plan notes found for this encounter.   Past Medical History:  Diagnosis Date  . Arthritis   . Blood in stool   . Cancer ([place])   . Chicken pox   . Diabetes mellitus 2000  . GERD (gastroesophageal reflux disease)   . Hiatal hernia   . Hypertension   . Kidney infection 2014  . Ovarian cancer ([place]) 1991   s/p hysterectomy at [facility]  . Pernicious anemia   . Spinal stenosis   . Ulcer   . Urinary incontinence  Past Surgical History:  Procedure Laterality Date  . ABDOMINAL HYSTERECTOMY    . FOOT SURGERY  2000  . SHOULDER SURGERY  2013   fracture  right shoulder    Family History  Problem Relation Age of Onset  . Arthritis Mother   . Cancer Mother     colon  . Arthritis Father   . Cancer Father     colon  . Cancer Sister     lung  . Cancer Other     lung    Social History   Social History  . Marital status: Widowed    Spouse name: N/A  . Number of children: N/A  . Years of education: N/A   Occupational History  . Not on file.   Social History Main Topics  . Smoking status: Never Smoker  . Smokeless tobacco: Never Used  . Alcohol use No  . Drug use: No  . Sexual activity: No   Other Topics Concern  . Not on file   Social History Narrative   Lives alone in [place]. 1 daughter living in [place]. Son died lung cancer.    Allergies  Allergen Reactions  . Aspirin Other (See Comments)    bleeding  . Sulfa Antibiotics Swelling     Review of Systems  Constitutional: Negative for fatigue, fever, malaise/fatigue and weight loss.  Eyes: Negative for blurred vision.  Respiratory: Positive for shortness of breath. Negative for cough and wheezing.   Cardiovascular: Negative for chest pain and palpitations.  Gastrointestinal: Negative for abdominal pain, anorexia, heartburn, hematemesis, hematochezia, melena and nausea.  Genitourinary: Negative for menorrhagia and vaginal bleeding.  Skin: Negative for pallor.  Neurological: Negative for dizziness, tremors, seizures, speech difficulty, weakness, light-headedness, headaches and paresthesias.  Endo/Heme/Allergies: Negative for polydipsia and polyphagia. Does not bruise/bleed easily.  Psychiatric/Behavioral: Negative for confusion. The patient is not nervous/anxious.      Objective  Vitals:   04/11/16 0907  BP: 122/78  Pulse: 78  Weight: 201 lb (91.2 kg)  Height: 5\' 3"  (1.6 m)    Physical Exam  Constitutional: She is well-developed, well-nourished, and in no distress. No distress.  HENT:  Head: Normocephalic and atraumatic.  Right Ear: External ear  normal.  Left Ear: External ear normal.  Nose: Nose normal.  Mouth/Throat: Oropharynx is clear and moist.  Eyes: Conjunctivae and EOM are normal. Pupils are equal, round, and reactive to light. Right eye exhibits no discharge. Left eye exhibits no discharge.  Neck: Normal range of motion. Neck supple. No JVD present. No thyromegaly present.  Cardiovascular: Normal rate, regular rhythm, normal heart sounds and intact distal pulses.  Exam reveals no gallop and no friction rub.   No murmur heard. Pulmonary/Chest: Effort normal and breath sounds normal. She has no wheezes. She has no rales.  Abdominal: Soft. Bowel sounds are normal. She exhibits no mass. There is no tenderness. There is no guarding.  Musculoskeletal: Normal range of motion. She exhibits no edema.  Lymphadenopathy:    She has no cervical adenopathy.  Neurological: She is alert. She has normal reflexes.  Skin: Skin is warm and dry. No rash noted. She is not diaphoretic. No pallor.  Psychiatric: Mood and affect normal.  Nursing note and vitals reviewed.     Assessment & Plan  Problem List Items Addressed This Visit      Cardiovascular and Mediastinum   Essential hypertension, benign - Primary   Relevant Medications   lisinopril (PRINIVIL,ZESTRIL) 10 MG tablet   Other Relevant Orders  Renal Function Panel     Digestive   B12 deficiency   Relevant Medications   cyanocobalamin ((VITAMIN B-12)) injection 1,000 mcg (Completed)     Endocrine   Diabetes mellitus type 2 with complications (HCC)   Relevant Medications   glipiZIDE (GLUCOTROL XL) 10 MG 24 hr tablet   gabapentin (NEURONTIN) 100 MG capsule   lisinopril (PRINIVIL,ZESTRIL) 10 MG tablet   metFORMIN (GLUCOPHAGE) 1000 MG tablet   Other Relevant Orders   Hemoglobin A1c     Other   Pernicious anemia   Relevant Medications   ferrous sulfate 324 (65 Fe) MG TBEC   cyanocobalamin ((VITAMIN B-12)) injection 1,000 mcg (Completed)   Other Relevant Orders   CBC    Anemia   Relevant Medications   ferrous sulfate 324 (65 Fe) MG TBEC   cyanocobalamin ((VITAMIN B-12)) injection 1,000 mcg (Completed)   Other Relevant Orders   CBC    Other Visit Diagnoses    Chronic obstructive pulmonary disease, unspecified COPD type (HCC)       Relevant Medications   fluticasone furoate-vilanterol (BREO ELLIPTA) 100-25 MCG/INH AEPB   Gastroesophageal reflux disease, esophagitis presence not specified       Relevant Medications   pantoprazole (PROTONIX) 40 MG tablet   Vitamin D deficiency       Relevant Medications   Vitamin D, Ergocalciferol, (DRISDOL) 50000 units CAPS capsule        Dr. Deanna [facility]  04/11/16

## 2016-04-12 ENCOUNTER — Other Ambulatory Visit: Payer: Self-pay

## 2016-04-12 DIAGNOSIS — E119 Type 2 diabetes mellitus without complications: Secondary | ICD-10-CM

## 2016-04-12 LAB — RENAL FUNCTION PANEL
ALBUMIN: 4.4 g/dL (ref 3.5–4.7)
BUN/Creatinine Ratio: 17 (ref 12–28)
BUN: 16 mg/dL (ref 8–27)
CHLORIDE: 99 mmol/L (ref 96–106)
CO2: 26 mmol/L (ref 18–29)
Calcium: 9.9 mg/dL (ref 8.7–10.3)
Creatinine, Ser: 0.93 mg/dL (ref 0.57–1.00)
GFR calc non Af Amer: 57 mL/min/{1.73_m2} — ABNORMAL LOW (ref >59)
GFR, EST AFRICAN AMERICAN: 66 mL/min/{1.73_m2} (ref >59)
Glucose: 167 mg/dL — ABNORMAL HIGH (ref 65–99)
Phosphorus: 3.4 mg/dL (ref 2.5–4.5)
Potassium: 5.3 mmol/L — ABNORMAL HIGH (ref 3.5–5.2)
Sodium: 139 mmol/L (ref 134–144)

## 2016-04-12 LAB — HEMOGLOBIN A1C
ESTIMATED AVERAGE GLUCOSE: 183 mg/dL
HEMOGLOBIN A1C: 8 % — AB (ref 4.8–5.6)

## 2016-04-12 LAB — CBC
HEMATOCRIT: 37.4 % (ref 34.0–46.6)
HEMOGLOBIN: 12.6 g/dL (ref 11.1–15.9)
MCH: 33.7 pg — AB (ref 26.6–33.0)
MCHC: 33.7 g/dL (ref 31.5–35.7)
MCV: 100 fL — AB (ref 79–97)
Platelets: 295 10*3/uL (ref 150–379)
RBC: 3.74 x10E6/uL — ABNORMAL LOW (ref 3.77–5.28)
RDW: 13.1 % (ref 12.3–15.4)
WBC: 7 10*3/uL (ref 3.4–10.8)

## 2016-04-17 DIAGNOSIS — E1165 Type 2 diabetes mellitus with hyperglycemia: Secondary | ICD-10-CM | POA: Diagnosis not present

## 2016-04-17 DIAGNOSIS — E538 Deficiency of other specified B group vitamins: Secondary | ICD-10-CM | POA: Diagnosis not present

## 2016-04-17 DIAGNOSIS — E1142 Type 2 diabetes mellitus with diabetic polyneuropathy: Secondary | ICD-10-CM | POA: Diagnosis not present

## 2016-04-17 DIAGNOSIS — E559 Vitamin D deficiency, unspecified: Secondary | ICD-10-CM | POA: Diagnosis not present

## 2016-04-17 DIAGNOSIS — R809 Proteinuria, unspecified: Secondary | ICD-10-CM | POA: Diagnosis not present

## 2016-04-17 DIAGNOSIS — E1129 Type 2 diabetes mellitus with other diabetic kidney complication: Secondary | ICD-10-CM | POA: Diagnosis not present

## 2016-05-01 DIAGNOSIS — E11649 Type 2 diabetes mellitus with hypoglycemia without coma: Secondary | ICD-10-CM | POA: Diagnosis not present

## 2016-05-01 DIAGNOSIS — E1142 Type 2 diabetes mellitus with diabetic polyneuropathy: Secondary | ICD-10-CM | POA: Diagnosis not present

## 2016-05-01 DIAGNOSIS — E1165 Type 2 diabetes mellitus with hyperglycemia: Secondary | ICD-10-CM | POA: Diagnosis not present

## 2016-05-01 DIAGNOSIS — E538 Deficiency of other specified B group vitamins: Secondary | ICD-10-CM | POA: Diagnosis not present

## 2016-05-01 DIAGNOSIS — E1129 Type 2 diabetes mellitus with other diabetic kidney complication: Secondary | ICD-10-CM | POA: Diagnosis not present

## 2016-05-01 DIAGNOSIS — R809 Proteinuria, unspecified: Secondary | ICD-10-CM | POA: Diagnosis not present

## 2016-05-29 DIAGNOSIS — E1142 Type 2 diabetes mellitus with diabetic polyneuropathy: Secondary | ICD-10-CM | POA: Diagnosis not present

## 2016-05-29 DIAGNOSIS — E11649 Type 2 diabetes mellitus with hypoglycemia without coma: Secondary | ICD-10-CM | POA: Diagnosis not present

## 2016-05-29 DIAGNOSIS — R809 Proteinuria, unspecified: Secondary | ICD-10-CM | POA: Diagnosis not present

## 2016-05-29 DIAGNOSIS — E538 Deficiency of other specified B group vitamins: Secondary | ICD-10-CM | POA: Diagnosis not present

## 2016-05-29 DIAGNOSIS — E1129 Type 2 diabetes mellitus with other diabetic kidney complication: Secondary | ICD-10-CM | POA: Diagnosis not present

## 2016-05-29 DIAGNOSIS — E1165 Type 2 diabetes mellitus with hyperglycemia: Secondary | ICD-10-CM | POA: Diagnosis not present

## 2016-05-29 DIAGNOSIS — E669 Obesity, unspecified: Secondary | ICD-10-CM | POA: Diagnosis not present

## 2016-06-15 DIAGNOSIS — Z23 Encounter for immunization: Secondary | ICD-10-CM | POA: Diagnosis not present

## 2016-06-19 ENCOUNTER — Ambulatory Visit (INDEPENDENT_AMBULATORY_CARE_PROVIDER_SITE_OTHER): Payer: Medicare Other

## 2016-06-19 ENCOUNTER — Other Ambulatory Visit: Payer: Self-pay

## 2016-06-19 DIAGNOSIS — D519 Vitamin B12 deficiency anemia, unspecified: Secondary | ICD-10-CM

## 2016-06-19 MED ORDER — CYANOCOBALAMIN 1000 MCG/ML IJ SOLN
1000.0000 ug | Freq: Once | INTRAMUSCULAR | Status: AC
  Administered 2016-06-19: 1000 ug via INTRAMUSCULAR

## 2016-07-03 ENCOUNTER — Ambulatory Visit
Admission: RE | Admit: 2016-07-03 | Discharge: 2016-07-03 | Disposition: A | Discharge status: Home / Self Care | Payer: Medicare Other | Source: Ambulatory Visit | Attending: Family Medicine | Admitting: Family Medicine

## 2016-07-03 ENCOUNTER — Ambulatory Visit (INDEPENDENT_AMBULATORY_CARE_PROVIDER_SITE_OTHER): Payer: Medicare Other | Admitting: Family Medicine

## 2016-07-03 ENCOUNTER — Encounter: Payer: Self-pay | Admitting: Family Medicine

## 2016-07-03 VITALS — BP 130/70 | HR 80 | Ht 63.0 in | Wt 212.0 lb

## 2016-07-03 DIAGNOSIS — R0789 Other chest pain: Secondary | ICD-10-CM | POA: Insufficient documentation

## 2016-07-03 DIAGNOSIS — K449 Diaphragmatic hernia without obstruction or gangrene: Secondary | ICD-10-CM | POA: Insufficient documentation

## 2016-07-03 MED ORDER — TRAMADOL HCL 50 MG PO TABS: 50.0000 mg | ORAL_TABLET | Freq: Three times a day (TID) | ORAL | 0 refills | Status: DC | PRN

## 2016-07-03 NOTE — Progress Notes (Signed)
Name: Brenda [name]   MRN: [id number]    DOB: Sep 11, 1930   Date:07/03/2016       Progress Note  Subjective  Chief Complaint  Chief Complaint  Patient presents with  . chest wall pain    had a coughing spell Thursday from getting choked while talking to someone. Has had a "deep hurting behind right breast" since then.     Chest Pain   This is a new problem. The current episode started in the past 7 days. The onset quality is sudden. The problem occurs daily. The problem has been waxing and waning. The pain is present in the lateral region. The pain is at a severity of 5/10. The pain is moderate. The quality of the pain is described as dull. The pain does not radiate. Associated symptoms include a cough and shortness of breath. Pertinent negatives include no abdominal pain, back pain, dizziness, exertional chest pressure, fever, headaches, malaise/fatigue, nausea, palpitations or sputum production. The cough has no precipitants. The cough is non-productive. The pain is aggravated by deep breathing, coughing and movement. She has tried acetaminophen for the symptoms. The treatment provided mild relief.    No problem-specific Assessment & Plan notes found for this encounter.   Past Medical History:  Diagnosis Date  . Arthritis   . Blood in stool   . Cancer ([place])   . Chicken pox   . Diabetes mellitus 2000  . GERD (gastroesophageal reflux disease)   . Hiatal hernia   . Hypertension   . Kidney infection 2014  . Ovarian cancer ([place]) 1991   s/p hysterectomy at [facility]  . Pernicious anemia   . Spinal stenosis   . Ulcer ([place])   . Urinary incontinence     Past Surgical History:  Procedure Laterality Date  . ABDOMINAL HYSTERECTOMY    . FOOT SURGERY  2000  . SHOULDER SURGERY  2013   fracture right shoulder    Family History  Problem Relation Age of Onset  . Arthritis Mother   . Cancer Mother     colon  . Arthritis Father   . Cancer Father     colon  . Cancer Sister     lung  .  Cancer Other     lung    Social History   Social History  . Marital status: Widowed    Spouse name: N/A  . Number of children: N/A  . Years of education: N/A   Occupational History  . Not on file.   Social History Main Topics  . Smoking status: Never Smoker  . Smokeless tobacco: Never Used  . Alcohol use No  . Drug use: No  . Sexual activity: No   Other Topics Concern  . Not on file   Social History Narrative   Lives alone in [place]. 1 daughter living in [place]. Son died lung cancer.    Allergies  Allergen Reactions  . Aspirin Other (See Comments)    bleeding  . Sulfa Antibiotics Swelling     Review of Systems  Constitutional: Negative for chills, fever, malaise/fatigue and weight loss.  HENT: Negative for ear discharge, ear pain and sore throat.   Eyes: Negative for blurred vision.  Respiratory: Positive for cough and shortness of breath. Negative for sputum production and wheezing.   Cardiovascular: Positive for chest pain. Negative for palpitations and leg swelling.  Gastrointestinal: Negative for abdominal pain, blood in stool, constipation, diarrhea, heartburn, melena and nausea.  Genitourinary: Negative for dysuria, frequency, hematuria and  urgency.  Musculoskeletal: Negative for back pain, joint pain, myalgias and neck pain.  Skin: Negative for rash.  Neurological: Negative for dizziness, tingling, sensory change, focal weakness and headaches.  Endo/Heme/Allergies: Negative for environmental allergies and polydipsia. Does not bruise/bleed easily.  Psychiatric/Behavioral: Negative for depression and suicidal ideas. The patient is not nervous/anxious and does not have insomnia.      Objective  Vitals:   07/03/16 1051  BP: 130/70  Pulse: 80  Weight: 212 lb (96.2 kg)  Height: 5\' 3"  (1.6 m)    Physical Exam  Constitutional: She is well-developed, well-nourished, and in no distress. No distress.  HENT:  Head: Normocephalic and atraumatic.  Right  Ear: External ear normal.  Left Ear: External ear normal.  Nose: Nose normal.  Mouth/Throat: Oropharynx is clear and moist.  Eyes: Conjunctivae and EOM are normal. Pupils are equal, round, and reactive to light. Right eye exhibits no discharge. Left eye exhibits no discharge.  Neck: Normal range of motion. Neck supple. No JVD present. No thyromegaly present.  Cardiovascular: Normal rate, regular rhythm, normal heart sounds and intact distal pulses.  Exam reveals no gallop and no friction rub.   No murmur heard. Pulmonary/Chest: Effort normal and breath sounds normal. She has no wheezes. She has no rales. She exhibits tenderness.  Anterior chest wall tenderness/ right  Abdominal: Soft. Bowel sounds are normal. She exhibits no mass. There is no tenderness. There is no guarding.  Musculoskeletal: Normal range of motion. She exhibits no edema.  Lymphadenopathy:    She has no cervical adenopathy.  Neurological: She is alert. She has normal reflexes.  Skin: Skin is warm and dry. She is not diaphoretic.  Psychiatric: Mood and affect normal.  Nursing note and vitals reviewed.     Assessment & Plan  Problem List Items Addressed This Visit    None    Visit Diagnoses    Right-sided chest wall pain    -  Primary   Relevant Medications   traMADol (ULTRAM) 50 MG tablet   Other Relevant Orders   DG Ribs Unilateral Right    I spent 15 minutes with this patient, More than 50% of that time was spent in face to face education, counseling and care coordination.    Dr. [facility]  07/03/16

## 2016-07-03 NOTE — Addendum Note (Signed)
Addended by: Fredderick Severance on: 07/03/2016 12:29 PM   Modules accepted: Orders

## 2016-07-03 NOTE — Patient Instructions (Signed)

## 2016-08-06 ENCOUNTER — Ambulatory Visit (INDEPENDENT_AMBULATORY_CARE_PROVIDER_SITE_OTHER): Payer: Medicare Other

## 2016-08-06 DIAGNOSIS — E538 Deficiency of other specified B group vitamins: Secondary | ICD-10-CM | POA: Diagnosis not present

## 2016-08-06 MED ORDER — CYANOCOBALAMIN 1000 MCG/ML IJ SOLN
1000.0000 ug | Freq: Once | INTRAMUSCULAR | Status: AC
  Administered 2016-08-06: 1000 ug via INTRAMUSCULAR

## 2016-08-27 DIAGNOSIS — E1165 Type 2 diabetes mellitus with hyperglycemia: Secondary | ICD-10-CM | POA: Diagnosis not present

## 2016-08-30 DIAGNOSIS — R809 Proteinuria, unspecified: Secondary | ICD-10-CM | POA: Diagnosis not present

## 2016-08-30 DIAGNOSIS — E559 Vitamin D deficiency, unspecified: Secondary | ICD-10-CM | POA: Diagnosis not present

## 2016-08-30 DIAGNOSIS — E1129 Type 2 diabetes mellitus with other diabetic kidney complication: Secondary | ICD-10-CM | POA: Diagnosis not present

## 2016-08-30 DIAGNOSIS — E538 Deficiency of other specified B group vitamins: Secondary | ICD-10-CM | POA: Diagnosis not present

## 2016-08-30 DIAGNOSIS — E1142 Type 2 diabetes mellitus with diabetic polyneuropathy: Secondary | ICD-10-CM | POA: Diagnosis not present

## 2016-08-30 DIAGNOSIS — E11649 Type 2 diabetes mellitus with hypoglycemia without coma: Secondary | ICD-10-CM | POA: Diagnosis not present

## 2016-10-23 ENCOUNTER — Other Ambulatory Visit: Payer: Self-pay | Admitting: Family Medicine

## 2016-10-23 DIAGNOSIS — E118 Type 2 diabetes mellitus with unspecified complications: Secondary | ICD-10-CM

## 2016-10-29 ENCOUNTER — Other Ambulatory Visit: Payer: Self-pay

## 2016-11-02 ENCOUNTER — Ambulatory Visit (INDEPENDENT_AMBULATORY_CARE_PROVIDER_SITE_OTHER): Payer: Medicare Other

## 2016-11-02 DIAGNOSIS — E538 Deficiency of other specified B group vitamins: Secondary | ICD-10-CM | POA: Diagnosis not present

## 2016-11-02 MED ORDER — CYANOCOBALAMIN 1000 MCG/ML IJ SOLN
1000.0000 ug | Freq: Once | INTRAMUSCULAR | Status: AC
  Administered 2016-11-02: 1000 ug via INTRAMUSCULAR

## 2016-11-13 DIAGNOSIS — E538 Deficiency of other specified B group vitamins: Secondary | ICD-10-CM | POA: Diagnosis not present

## 2016-11-13 DIAGNOSIS — E559 Vitamin D deficiency, unspecified: Secondary | ICD-10-CM | POA: Diagnosis not present

## 2016-11-13 DIAGNOSIS — E1142 Type 2 diabetes mellitus with diabetic polyneuropathy: Secondary | ICD-10-CM | POA: Diagnosis not present

## 2016-11-16 ENCOUNTER — Ambulatory Visit
Admission: RE | Admit: 2016-11-16 | Discharge: 2016-11-16 | Disposition: A | Discharge status: Home / Self Care | Payer: Medicare Other | Source: Ambulatory Visit | Attending: Family Medicine | Admitting: Family Medicine

## 2016-11-16 ENCOUNTER — Ambulatory Visit (INDEPENDENT_AMBULATORY_CARE_PROVIDER_SITE_OTHER): Payer: Medicare Other | Admitting: Family Medicine

## 2016-11-16 ENCOUNTER — Encounter: Payer: Self-pay | Admitting: Family Medicine

## 2016-11-16 VITALS — BP 120/86 | HR 80 | Temp 99.0°F | Resp 12 | Ht 63.0 in | Wt 200.0 lb

## 2016-11-16 DIAGNOSIS — R509 Fever, unspecified: Secondary | ICD-10-CM

## 2016-11-16 DIAGNOSIS — J181 Lobar pneumonia, unspecified organism: Secondary | ICD-10-CM | POA: Insufficient documentation

## 2016-11-16 DIAGNOSIS — J449 Chronic obstructive pulmonary disease, unspecified: Secondary | ICD-10-CM | POA: Insufficient documentation

## 2016-11-16 DIAGNOSIS — K449 Diaphragmatic hernia without obstruction or gangrene: Secondary | ICD-10-CM | POA: Diagnosis not present

## 2016-11-16 DIAGNOSIS — R05 Cough: Secondary | ICD-10-CM | POA: Diagnosis not present

## 2016-11-16 DIAGNOSIS — J189 Pneumonia, unspecified organism: Secondary | ICD-10-CM

## 2016-11-16 LAB — HEMOGLOBIN A1C: Hemoglobin A1C: 7.9

## 2016-11-16 MED ORDER — AMOXICILLIN-POT CLAVULANATE 875-125 MG PO TABS: 1.0000 | ORAL_TABLET | Freq: Two times a day (BID) | ORAL | 0 refills | Status: DC

## 2016-11-16 MED ORDER — FLUTICASONE FUROATE-VILANTEROL 100-25 MCG/INH IN AEPB: 1.0000 | INHALATION_SPRAY | Freq: Every day | RESPIRATORY_TRACT | 1 refills | Status: DC

## 2016-11-16 MED ORDER — GUAIFENESIN-CODEINE 100-10 MG/5ML PO SYRP: 5.0000 mL | ORAL_SOLUTION | Freq: Three times a day (TID) | ORAL | 0 refills | Status: DC | PRN

## 2016-11-16 MED ORDER — ALBUTEROL SULFATE HFA 108 (90 BASE) MCG/ACT IN AERS: 2.0000 | INHALATION_SPRAY | Freq: Four times a day (QID) | RESPIRATORY_TRACT | 0 refills | Status: DC | PRN

## 2016-11-16 NOTE — Patient Instructions (Signed)
How to Use a Metered Dose Inhaler A metered dose inhaler is a handheld device for taking medicine that must be breathed into the lungs (inhaled). The device can be used to deliver a variety of inhaled medicines, including:  Quick relief or rescue medicines, such as bronchodilators.  Controller medicines, such as corticosteroids.  The medicine is delivered by pushing down on a metal canister to release a preset amount of spray and medicine. Each device contains the amount of medicine that is needed for a preset number of uses (inhalations). Your health care provider may recommend that you use a spacer with your inhaler to help you take the medicine more effectively. A spacer is a plastic tube with a mouthpiece on one end and an opening that connects to the inhaler on the other end. A spacer holds the medicine in a tube for a short time, which allows you to inhale more medicine. What are the risks? If you do not use your inhaler correctly, medicine might not reach your lungs to help you breathe. Inhaler medicine can cause side effects, such as:  Mouth or throat infection.  Cough.  Hoarseness.  Headache.  Nausea and vomiting.  Lung infection (pneumonia) in people who have a lung condition called COPD.  How to use a metered dose inhaler without a spacer 1. Remove the cap from the inhaler. 2. If you are using the inhaler for the first time, shake it for 5 seconds, turn it away from your face, then release 4 puffs into the air. This is called priming. 3. Shake the inhaler for 5 seconds. 4. Position the inhaler so the top of the canister faces up. 5. Put your index finger on the top of the medicine canister. Support the bottom of the inhaler with your thumb. 6. Breathe out normally and as completely as possible, away from the inhaler. 7. Either place the inhaler between your teeth and close your lips tightly around the mouthpiece, or hold the inhaler 1-2 inches (2.5-5 cm) away from your open  mouth. Keep your tongue down out of the way. If you are unsure which technique to use, ask your health care provider. 8. Press the canister down with your index finger to release the medicine, then inhale deeply and slowly through your mouth (not your nose) until your lungs are completely filled. Inhaling should take 4-6 seconds. 9. Hold the medicine in your lungs for 5-10 seconds (10 seconds is best). This helps the medicine get into the small airways of your lungs. 10. With your lips in a tight circle (pursed), breathe out slowly. 11. Repeat steps 3-10 until you have taken the number of puffs that your health care provider directed. Wait about 1 minute between puffs or as directed. 12. Put the cap on the inhaler. 13. If you are using a steroid inhaler, rinse your mouth with water, gargle, and spit out the water. Do not swallow the water. How to use a metered dose inhaler with a spacer 1. Remove the cap from the inhaler. 2. If you are using the inhaler for the first time, shake it for 5 seconds, turn it away from your face, then release 4 puffs into the air. This is called priming. 3. Shake the inhaler for 5 seconds. 4. Place the open end of the spacer onto the inhaler mouthpiece. 5. Position the inhaler so the top of the canister faces up and the spacer mouthpiece faces you. 6. Put your index finger on the top of the medicine canister.   Support the bottom of the inhaler and the spacer with your thumb. 7. Breathe out normally and as completely as possible, away from the spacer. 8. Place the spacer between your teeth and close your lips tightly around it. Keep your tongue down out of the way. 9. Press the canister down with your index finger to release the medicine, then inhale deeply and slowly through your mouth (not your nose) until your lungs are completely filled. Inhaling should take 4-6 seconds. 10. Hold the medicine in your lungs for 5-10 seconds (10 seconds is best). This helps the medicine  get into the small airways of your lungs. 11. With your lips in a tight circle (pursed), breathe out slowly. 12. Repeat steps 3-11 until you have taken the number of puffs that your health care provider directed. Wait about 1 minute between puffs or as directed. 13. Remove the spacer from the inhaler and put the cap on the inhaler. 14. If you are using a steroid inhaler, rinse your mouth with water, gargle, and spit out the water. Do not swallow the water. Follow these instructions at home:  Take your inhaled medicine only as told by your health care provider. Do not use the inhaler more than directed by your health care provider.  Keep all follow-up visits as told by your health care provider. This is important.  If your inhaler has a counter, you can check it to determine how full your inhaler is. If your inhaler does not have a counter, ask your health care provider when you will need to refill your inhaler and write the refill date on a calendar or on your inhaler canister. Note that you cannot know when an inhaler is empty by shaking it.  Follow directions on the package insert for care and cleaning of your inhaler and spacer. Contact a health care provider if:  Symptoms are only partially relieved with your inhaler.  You are having trouble using your inhaler.  You have an increase in phlegm.  You have headaches. Get help right away if:  You feel little or no relief after using your inhaler.  You have dizziness.  You have a fast heart rate.  You have chills or a fever.  You have night sweats.  There is blood in your phlegm. Summary  A metered dose inhaler is a handheld device for taking medicine that must be breathed into the lungs (inhaled).  The medicine is delivered by pushing down on a metal canister to release a preset amount of spray and medicine.  Each device contains the amount of medicine that is needed for a preset number of uses (inhalations). This  information is not intended to replace advice given to you by your health care provider. Make sure you discuss any questions you have with your health care provider. Document Released: 08/27/2005 Document Revised: 07/17/2016 Document Reviewed: 07/17/2016 Elsevier Interactive Patient Education  2017 [employer] Inc[employer]  

## 2016-11-16 NOTE — Progress Notes (Signed)
Name: Brenda [name]   MRN: [id number]    DOB: [birth year]   Date:11/16/2016       Progress Note  Subjective  Chief Complaint  Chief Complaint  Patient presents with  . Sinusitis    cough and cong- head is "stopped up"    Sinusitis  This is a new problem. The current episode started in the past 7 days. The problem has been gradually worsening since onset. The maximum temperature recorded prior to her arrival was 100.4 - 100.9 F. Her pain is at a severity of 2/10. The pain is moderate. Associated symptoms include congestion, coughing, a hoarse voice, shortness of breath, sinus pressure and sneezing. Pertinent negatives include no chills, ear pain, headaches, neck pain, sore throat or swollen glands. Treatments tried: nasal spray.  Cough  This is a new problem. The current episode started in the past 7 days. The problem has been gradually worsening. The cough is productive of sputum. Associated symptoms include a fever, nasal congestion, postnasal drip, rhinorrhea and shortness of breath. Pertinent negatives include no chest pain, chills, ear pain, headaches, heartburn, myalgias, rash, sore throat, weight loss or wheezing. Treatments tried: nasal inhaler. There is no history of environmental allergies.    No problem-specific Assessment & Plan notes found for this encounter.   Past Medical History:  Diagnosis Date  . Arthritis   . Blood in stool   . Cancer ([place])   . Chicken pox   . Diabetes mellitus 2000  . GERD (gastroesophageal reflux disease)   . Hiatal hernia   . Hypertension   . Kidney infection 2014  . Ovarian cancer ([place]) 1991   s/p hysterectomy at [facility]  . Pernicious anemia   . Spinal stenosis   . Ulcer ([place])   . Urinary incontinence     Past Surgical History:  Procedure Laterality Date  . ABDOMINAL HYSTERECTOMY    . FOOT SURGERY  2000  . SHOULDER SURGERY  2013   fracture right shoulder    Family History  Problem Relation Age of Onset  . Arthritis Mother   . Cancer  Mother     colon  . Arthritis Father   . Cancer Father     colon  . Cancer Sister     lung  . Cancer Other     lung    Social History   Social History  . Marital status: Widowed    Spouse name: N/A  . Number of children: N/A  . Years of education: N/A   Occupational History  . Not on file.   Social History Main Topics  . Smoking status: Never Smoker  . Smokeless tobacco: Never Used  . Alcohol use No  . Drug use: No  . Sexual activity: No   Other Topics Concern  . Not on file   Social History Narrative   Lives alone in [place]. 1 daughter living in [place]. Son died lung cancer.    Allergies  Allergen Reactions  . Aspirin Other (See Comments)    bleeding  . Sulfa Antibiotics Swelling    Outpatient Medications Prior to Visit  Medication Sig Dispense Refill  . cholecalciferol (VITAMIN D) 1000 units tablet Take 2,000 Units by mouth daily.    . cyanocobalamin (,VITAMIN B-12,) 1000 MCG/ML injection Inject 1,000 mcg into the muscle every 30 (thirty) days.    . ferrous sulfate 324 (65 Fe) MG TBEC Take 1 tablet (325 mg total) by mouth daily. 90 tablet 1  . gabapentin (NEURONTIN) 100 MG  capsule Take 1 capsule (100 mg total) by mouth as needed. Take one capsule by mouth every night at bedtime. 90 capsule 1  . glipiZIDE (GLUCOTROL XL) 10 MG 24 hr tablet Take 1 tablet (10 mg total) by mouth 2 (two) times daily. 180 tablet 1  . lisinopril (PRINIVIL,ZESTRIL) 10 MG tablet Take 1 tablet (10 mg total) by mouth daily. 90 tablet 1  . Melatonin 10 MG TABS Take 10 mg by mouth daily.    . metFORMIN (GLUCOPHAGE) 1000 MG tablet Take 1 tablet (1,000 mg total) by mouth 2 (two) times daily with a meal. 180 tablet 1  . pantoprazole (PROTONIX) 40 MG tablet Take 1 tablet (40 mg total) by mouth daily. 90 tablet 1  . solifenacin (VESICARE) 5 MG tablet Take 5 mg by mouth as needed.     . sucralfate (CARAFATE) 1 G tablet Take 1 g by mouth as needed.     . Vitamin D, Ergocalciferol, (DRISDOL) 50000  units CAPS capsule Take 1 capsule (50,000 Units total) by mouth every 30 (thirty) days. 3 capsule 1  . fluticasone furoate-vilanterol (BREO ELLIPTA) 100-25 MCG/INH AEPB Inhale 1 puff into the lungs daily. 60 each 1  . traMADol (ULTRAM) 50 MG tablet Take 1 tablet (50 mg total) by mouth every 8 (eight) hours as needed. 30 tablet 0   No facility-administered medications prior to visit.     Review of Systems  Constitutional: Positive for fever. Negative for chills, malaise/fatigue and weight loss.  HENT: Positive for congestion, hoarse voice, postnasal drip, rhinorrhea, sinus pressure and sneezing. Negative for ear discharge, ear pain and sore throat.   Eyes: Negative for blurred vision.  Respiratory: Positive for cough and shortness of breath. Negative for sputum production and wheezing.   Cardiovascular: Negative for chest pain, palpitations and leg swelling.  Gastrointestinal: Negative for abdominal pain, blood in stool, constipation, diarrhea, heartburn, melena and nausea.  Genitourinary: Negative for dysuria, frequency, hematuria and urgency.  Musculoskeletal: Negative for back pain, joint pain, myalgias and neck pain.  Skin: Negative for rash.  Neurological: Negative for dizziness, tingling, sensory change, focal weakness and headaches.  Endo/Heme/Allergies: Negative for environmental allergies and polydipsia. Does not bruise/bleed easily.  Psychiatric/Behavioral: Negative for depression and suicidal ideas. The patient is not nervous/anxious and does not have insomnia.      Objective  Vitals:   11/16/16 1413  BP: 120/86  Pulse: 80  Resp: 12  Temp: 99 F (37.2 C)  TempSrc: Oral  SpO2: 95%  Weight: 200 lb (90.7 kg)  Height: 5\' 3"  (1.6 m)    Physical Exam  Constitutional: She is well-developed, well-nourished, and in no distress. No distress.  HENT:  Head: Normocephalic and atraumatic.  Right Ear: External ear normal.  Left Ear: External ear normal.  Nose: Nose normal.   Mouth/Throat: Oropharynx is clear and moist.  Eyes: Conjunctivae and EOM are normal. Pupils are equal, round, and reactive to light. Right eye exhibits no discharge. Left eye exhibits no discharge.  Neck: Normal range of motion. Neck supple. No JVD present. No thyromegaly present.  Cardiovascular: Normal rate, regular rhythm, normal heart sounds and intact distal pulses.  Exam reveals no gallop and no friction rub.   No murmur heard. Pulmonary/Chest: Effort normal. No respiratory distress. She has decreased breath sounds in the left lower field. She has wheezes. She has no rales. She exhibits no tenderness.  Abdominal: Soft. Bowel sounds are normal. She exhibits no mass. There is no tenderness. There is no guarding.  Musculoskeletal: Normal range of motion. She exhibits no edema.  Lymphadenopathy:    She has no cervical adenopathy.  Neurological: She is alert. She has normal reflexes.  Skin: Skin is warm and dry. She is not diaphoretic.  Psychiatric: Mood and affect normal.  Nursing note and vitals reviewed.     Assessment & Plan  Problem List Items Addressed This Visit    None    Visit Diagnoses    Community acquired pneumonia of left lower lobe of lung ([place])    -  Primary   Relevant Medications   amoxicillin-clavulanate (AUGMENTIN) 875-125 MG tablet   fluticasone furoate-vilanterol (BREO ELLIPTA) 100-25 MCG/INH AEPB   guaiFENesin-codeine (ROBITUSSIN AC) 100-10 MG/5ML syrup   albuterol (PROVENTIL HFA;VENTOLIN HFA) 108 (90 Base) MCG/ACT inhaler   Other Relevant Orders   DG Chest 2 View   Chronic obstructive pulmonary disease, unspecified COPD type (HCC)       Relevant Medications   fluticasone furoate-vilanterol (BREO ELLIPTA) 100-25 MCG/INH AEPB   guaiFENesin-codeine (ROBITUSSIN AC) 100-10 MG/5ML syrup   albuterol (PROVENTIL HFA;VENTOLIN HFA) 108 (90 Base) MCG/ACT inhaler   Other Relevant Orders   DG Chest 2 View   Fever, unspecified fever cause       Relevant Orders   DG  Chest 2 View      Meds ordered this encounter  Medications  . amoxicillin-clavulanate (AUGMENTIN) 875-125 MG tablet    Sig: Take 1 tablet by mouth 2 (two) times daily.    Dispense:  20 tablet    Refill:  0  . fluticasone furoate-vilanterol (BREO ELLIPTA) 100-25 MCG/INH AEPB    Sig: Inhale 1 puff into the lungs daily.    Dispense:  60 each    Refill:  1  . guaiFENesin-codeine (ROBITUSSIN AC) 100-10 MG/5ML syrup    Sig: Take 5 mLs by mouth 3 (three) times daily as needed for cough.    Dispense:  150 mL    Refill:  0  . albuterol (PROVENTIL HFA;VENTOLIN HFA) 108 (90 Base) MCG/ACT inhaler    Sig: Inhale 2 puffs into the lungs every 6 (six) hours as needed for wheezing or shortness of breath.    Dispense:  1 Inhaler    Refill:  0      Dr. Mac[facility]  11/16/16

## 2016-11-19 ENCOUNTER — Ambulatory Visit: Payer: Medicare Other | Admitting: Family Medicine

## 2016-11-21 ENCOUNTER — Encounter: Payer: Self-pay | Admitting: Family Medicine

## 2016-11-21 ENCOUNTER — Ambulatory Visit (INDEPENDENT_AMBULATORY_CARE_PROVIDER_SITE_OTHER): Payer: Medicare Other | Admitting: Family Medicine

## 2016-11-21 VITALS — BP 120/80 | HR 80 | Temp 98.5°F | Resp 14 | Ht 63.0 in | Wt 200.0 lb

## 2016-11-21 DIAGNOSIS — J4521 Mild intermittent asthma with (acute) exacerbation: Secondary | ICD-10-CM | POA: Diagnosis not present

## 2016-11-21 DIAGNOSIS — J40 Bronchitis, not specified as acute or chronic: Secondary | ICD-10-CM | POA: Diagnosis not present

## 2016-11-21 MED ORDER — PREDNISONE 10 MG PO TABS: 10.0000 mg | ORAL_TABLET | Freq: Every day | ORAL | 0 refills | Status: DC

## 2016-11-21 MED ORDER — LEVOFLOXACIN 500 MG PO TABS: 500.0000 mg | ORAL_TABLET | Freq: Every day | ORAL | 0 refills | Status: DC

## 2016-11-21 MED ORDER — IPRATROPIUM-ALBUTEROL 0.5-2.5 (3) MG/3ML IN SOLN: 3.0000 mL | Freq: Once | RESPIRATORY_TRACT | Status: DC

## 2016-11-21 NOTE — Progress Notes (Signed)
Name: Brenda [name]   MRN: [id number]    DOB: [birth year]   Date:11/21/2016       Progress Note  Subjective  Chief Complaint  Chief Complaint  Patient presents with  . Follow-up    still coughing and feels like "I can't get no air up"    Cough  This is a new problem. The current episode started in the past 7 days. The problem has been waxing and waning. The problem occurs every few minutes. The cough is non-productive. Associated symptoms include heartburn, shortness of breath and wheezing. Pertinent negatives include no chest pain, chills, ear congestion, ear pain, fever, headaches, hemoptysis, myalgias, nasal congestion, postnasal drip, rash, rhinorrhea, sore throat, sweats or weight loss. The symptoms are aggravated by pollens and cold air. She has tried a beta-agonist inhaler and steroid inhaler for the symptoms. The treatment provided mild relief. Her past medical history is significant for asthma. There is no history of COPD or environmental allergies.    No problem-specific Assessment & Plan notes found for this encounter.   Past Medical History:  Diagnosis Date  . Arthritis   . Blood in stool   . Cancer ([place])   . Chicken pox   . Diabetes mellitus 2000  . GERD (gastroesophageal reflux disease)   . Hiatal hernia   . Hypertension   . Kidney infection 2014  . Ovarian cancer ([place]) 1991   s/p hysterectomy at [facility]  . Pernicious anemia   . Spinal stenosis   . Ulcer ([place])   . Urinary incontinence     Past Surgical History:  Procedure Laterality Date  . ABDOMINAL HYSTERECTOMY    . FOOT SURGERY  2000  . SHOULDER SURGERY  2013   fracture right shoulder    Family History  Problem Relation Age of Onset  . Arthritis Mother   . Cancer Mother     colon  . Arthritis Father   . Cancer Father     colon  . Cancer Sister     lung  . Cancer Other     lung    Social History   Social History  . Marital status: Widowed    Spouse name: N/A  . Number of children: N/A  .  Years of education: N/A   Occupational History  . Not on file.   Social History Main Topics  . Smoking status: Never Smoker  . Smokeless tobacco: Never Used  . Alcohol use No  . Drug use: No  . Sexual activity: No   Other Topics Concern  . Not on file   Social History Narrative   Lives alone in [place]. 1 daughter living in [place]. Son died lung cancer.    Allergies  Allergen Reactions  . Aspirin Other (See Comments)    bleeding  . Sulfa Antibiotics Swelling    Outpatient Medications Prior to Visit  Medication Sig Dispense Refill  . albuterol (PROVENTIL HFA;VENTOLIN HFA) 108 (90 Base) MCG/ACT inhaler Inhale 2 puffs into the lungs every 6 (six) hours as needed for wheezing or shortness of breath. 1 Inhaler 0  . cholecalciferol (VITAMIN D) 1000 units tablet Take 2,000 Units by mouth daily.    . cyanocobalamin (,VITAMIN B-12,) 1000 MCG/ML injection Inject 1,000 mcg into the muscle every 30 (thirty) days.    . ferrous sulfate 324 (65 Fe) MG TBEC Take 1 tablet (325 mg total) by mouth daily. 90 tablet 1  . fluticasone furoate-vilanterol (BREO ELLIPTA) 100-25 MCG/INH AEPB Inhale 1 puff into the  lungs daily. 60 each 1  . gabapentin (NEURONTIN) 100 MG capsule Take 1 capsule (100 mg total) by mouth as needed. Take one capsule by mouth every night at bedtime. 90 capsule 1  . glipiZIDE (GLUCOTROL XL) 10 MG 24 hr tablet Take 1 tablet (10 mg total) by mouth 2 (two) times daily. 180 tablet 1  . guaiFENesin-codeine (ROBITUSSIN AC) 100-10 MG/5ML syrup Take 5 mLs by mouth 3 (three) times daily as needed for cough. 150 mL 0  . lisinopril (PRINIVIL,ZESTRIL) 10 MG tablet Take 1 tablet (10 mg total) by mouth daily. 90 tablet 1  . Melatonin 10 MG TABS Take 10 mg by mouth daily.    . metFORMIN (GLUCOPHAGE) 1000 MG tablet Take 1 tablet (1,000 mg total) by mouth 2 (two) times daily with a meal. 180 tablet 1  . pantoprazole (PROTONIX) 40 MG tablet Take 1 tablet (40 mg total) by mouth daily. 90 tablet 1   . solifenacin (VESICARE) 5 MG tablet Take 5 mg by mouth as needed.     . sucralfate (CARAFATE) 1 G tablet Take 1 g by mouth as needed.     . Vitamin D, Ergocalciferol, (DRISDOL) 50000 units CAPS capsule Take 1 capsule (50,000 Units total) by mouth every 30 (thirty) days. 3 capsule 1  . amoxicillin-clavulanate (AUGMENTIN) 875-125 MG tablet Take 1 tablet by mouth 2 (two) times daily. 20 tablet 0   No facility-administered medications prior to visit.     Review of Systems  Constitutional: Negative for chills, fever, malaise/fatigue and weight loss.  HENT: Negative for ear discharge, ear pain, postnasal drip, rhinorrhea and sore throat.   Eyes: Negative for blurred vision.  Respiratory: Positive for cough, shortness of breath and wheezing. Negative for hemoptysis and sputum production.   Cardiovascular: Negative for chest pain, palpitations and leg swelling.  Gastrointestinal: Positive for heartburn. Negative for abdominal pain, blood in stool, constipation, diarrhea, melena and nausea.  Genitourinary: Negative for dysuria, frequency, hematuria and urgency.  Musculoskeletal: Negative for back pain, joint pain, myalgias and neck pain.  Skin: Negative for rash.  Neurological: Negative for dizziness, tingling, sensory change, focal weakness and headaches.  Endo/Heme/Allergies: Negative for environmental allergies and polydipsia. Does not bruise/bleed easily.  Psychiatric/Behavioral: Negative for depression and suicidal ideas. The patient is not nervous/anxious and does not have insomnia.      Objective  Vitals:   11/21/16 1119 11/21/16 1145  BP: 120/80   Pulse: 80   Resp:  14  Temp: 98.5 F (36.9 C)   TempSrc: Oral   SpO2: 95% 96%  Weight: 200 lb (90.7 kg)   Height: 5\' 3"  (1.6 m)     Physical Exam  Constitutional: She is well-developed, well-nourished, and in no distress. No distress.  HENT:  Head: Normocephalic and atraumatic.  Right Ear: External ear normal.  Left Ear:  External ear normal.  Nose: Nose normal.  Mouth/Throat: Oropharynx is clear and moist.  Eyes: Conjunctivae and EOM are normal. Pupils are equal, round, and reactive to light. Right eye exhibits no discharge. Left eye exhibits no discharge.  Neck: Normal range of motion. Neck supple. No JVD present. No thyromegaly present.  Cardiovascular: Normal rate, regular rhythm, normal heart sounds and intact distal pulses.  Exam reveals no gallop and no friction rub.   No murmur heard. Pulmonary/Chest: Effort normal. She has wheezes. She has no rales.  Abdominal: Soft. Bowel sounds are normal. She exhibits no mass. There is no tenderness. There is no guarding.  Musculoskeletal: Normal range of  motion. She exhibits no edema.  Lymphadenopathy:    She has no cervical adenopathy.  Neurological: She is alert. She has normal reflexes.  Skin: Skin is warm and dry. She is not diaphoretic.  Psychiatric: Mood and affect normal.  Nursing note and vitals reviewed.     Assessment & Plan  Problem List Items Addressed This Visit    None    Visit Diagnoses    Bronchitis    -  Primary   Relevant Medications   levofloxacin (LEVAQUIN) 500 MG tablet   ipratropium-albuterol (DUONEB) 0.5-2.5 (3) MG/3ML nebulizer solution 3 mL   Mild intermittent reactive airway disease with acute exacerbation       spireva given   Relevant Medications   predniSONE (DELTASONE) 10 MG tablet   ipratropium-albuterol (DUONEB) 0.5-2.5 (3) MG/3ML nebulizer solution 3 mL      Meds ordered this encounter  Medications  . levofloxacin (LEVAQUIN) 500 MG tablet    Sig: Take 1 tablet (500 mg total) by mouth daily.    Dispense:  7 tablet    Refill:  0  . predniSONE (DELTASONE) 10 MG tablet    Sig: Take 1 tablet (10 mg total) by mouth daily with breakfast.    Dispense:  14 tablet    Refill:  0  . ipratropium-albuterol (DUONEB) 0.5-2.5 (3) MG/3ML nebulizer solution 3 mL      Dr. Estle Sabella [facility]  11/21/16

## 2016-11-30 ENCOUNTER — Encounter: Payer: Self-pay | Admitting: Emergency Medicine

## 2016-11-30 ENCOUNTER — Emergency Department: Payer: Medicare Other

## 2016-11-30 ENCOUNTER — Emergency Department
Admission: EM | Admit: 2016-11-30 | Discharge: 2016-11-30 | Disposition: A | Discharge status: Home / Self Care | Payer: Medicare Other | Attending: Emergency Medicine | Admitting: Emergency Medicine

## 2016-11-30 ENCOUNTER — Other Ambulatory Visit: Payer: Self-pay

## 2016-11-30 DIAGNOSIS — Z79899 Other long term (current) drug therapy: Secondary | ICD-10-CM | POA: Insufficient documentation

## 2016-11-30 DIAGNOSIS — I10 Essential (primary) hypertension: Secondary | ICD-10-CM | POA: Insufficient documentation

## 2016-11-30 DIAGNOSIS — R05 Cough: Secondary | ICD-10-CM | POA: Diagnosis not present

## 2016-11-30 DIAGNOSIS — R059 Cough, unspecified: Secondary | ICD-10-CM

## 2016-11-30 DIAGNOSIS — Z7984 Long term (current) use of oral hypoglycemic drugs: Secondary | ICD-10-CM | POA: Insufficient documentation

## 2016-11-30 DIAGNOSIS — E119 Type 2 diabetes mellitus without complications: Secondary | ICD-10-CM | POA: Insufficient documentation

## 2016-11-30 MED ORDER — LORATADINE 10 MG PO TABS: 10.0000 mg | ORAL_TABLET | Freq: Every day | ORAL | 0 refills | Status: DC

## 2016-11-30 NOTE — ED Triage Notes (Signed)
Pt to ED via POV, pt states that she was seen by her PCP 2 weeks ago and was diagnosed with pneumonia. Pt states that she has taken 2 round of antibiotics but she is still coughing. Pt in NAD in triage.

## 2016-11-30 NOTE — ED Notes (Signed)
See triage note  Presents with cough  Was seen and dx'd with pneumonia   conts to have cough

## 2016-11-30 NOTE — ED Provider Notes (Signed)
[facility] Emergency Department Provider Note  ____________________________________________   First MD Initiated Contact with Patient 11/30/16 1047     (approximate)  I have reviewed the triage vital signs and the nursing notes.   HISTORY  Chief Complaint Cough   HPI Brenda [name] is a 83 y.o. female with a history of hypertension and diabetes who is presenting to the emergency department today with cough over the past 2 weeks. She has been treated with 2 rounds of antibiotics. She was on Augmentin first and then just finished a course of Levaquin yesterday. She is continuing to take a steroid and is also been prescribed albuterol and nasal sprays. She is coming in today because of a worsening cough overnight. She says that she had a tickle in her throat all night and was coughing and was unable to sleep. She denies any fevers. Denies any body aches. Says that she is having a cough that is productive of white sputum. Says that she thinks she feels something dripping down the back of her throat as well. Denies any runny nose over the past 5 days but says that she had had been having a runny nose previously.   Past Medical History:  Diagnosis Date  . Arthritis   . Blood in stool   . Cancer ([place])   . Chicken pox   . Diabetes mellitus 2000  . GERD (gastroesophageal reflux disease)   . Hiatal hernia   . Hypertension   . Kidney infection 2014  . Ovarian cancer ([place]) 1991   s/p hysterectomy at [facility]  . Pernicious anemia   . Spinal stenosis   . Ulcer ([place])   . Urinary incontinence     Patient Active Problem List   Diagnosis Date Noted  . Screening for breast cancer 08/18/2015  . B12 deficiency 12/02/2014  . Lumbago 07/09/2014  . Shortness of breath 06/30/2014  . Obese 06/30/2014  . Right facial pain 05/18/2014  . Anemia 01/01/2014  . Osteoarthritis 07/31/2013  . Other malaise and fatigue 07/09/2013  . Muscle cramp, nocturnal 03/25/2013  . Medicare  annual wellness visit, subsequent 12/02/2012  . Dermatitis 12/02/2012  . Other and unspecified hyperlipidemia 12/02/2012  . Essential hypertension, benign 12/02/2012  . Chronic low back pain 08/28/2012  . Osteoporosis 07/23/2012  . Paresthesia of bilateral legs 05/30/2012  . Pernicious anemia 05/30/2012  . Diabetes mellitus type 2 with complications ([facility]) 16/06/9603  . Depression 05/30/2012  . Insomnia 05/30/2012  . Weakness of both legs 05/30/2012    Past Surgical History:  Procedure Laterality Date  . ABDOMINAL HYSTERECTOMY    . FOOT SURGERY  2000  . SHOULDER SURGERY  2013   fracture right shoulder    Prior to Admission medications   Medication Sig Start Date End Date Taking? Authorizing Provider  albuterol (PROVENTIL HFA;VENTOLIN HFA) 108 (90 Base) MCG/ACT inhaler Inhale 2 puffs into the lungs every 6 (six) hours as needed for wheezing or shortness of breath. 11/16/16   Juline Patch, MD  cholecalciferol (VITAMIN D) 1000 units tablet Take 2,000 Units by mouth daily.    Historical Provider, MD  cyanocobalamin (,VITAMIN B-12,) 1000 MCG/ML injection Inject 1,000 mcg into the muscle every 30 (thirty) days.    Historical Provider, MD  ferrous sulfate 324 (65 Fe) MG TBEC Take 1 tablet (325 mg total) by mouth daily. 04/11/16   Juline Patch, MD  fluticasone furoate-vilanterol (BREO ELLIPTA) 100-25 MCG/INH AEPB Inhale 1 puff into the lungs daily. 11/16/16  Juline Patch, MD  gabapentin (NEURONTIN) 100 MG capsule Take 1 capsule (100 mg total) by mouth as needed. Take one capsule by mouth every night at bedtime. 04/11/16   Juline Patch, MD  glipiZIDE (GLUCOTROL XL) 10 MG 24 hr tablet Take 1 tablet (10 mg total) by mouth 2 (two) times daily. 04/11/16   Juline Patch, MD  guaiFENesin-codeine (ROBITUSSIN AC) 100-10 MG/5ML syrup Take 5 mLs by mouth 3 (three) times daily as needed for cough. 11/16/16   Juline Patch, MD  levofloxacin (LEVAQUIN) 500 MG tablet Take 1 tablet (500 mg total) by mouth  daily. 11/21/16   Juline Patch, MD  lisinopril (PRINIVIL,ZESTRIL) 10 MG tablet Take 1 tablet (10 mg total) by mouth daily. 04/11/16   Juline Patch, MD  Melatonin 10 MG TABS Take 10 mg by mouth daily.    Historical Provider, MD  metFORMIN (GLUCOPHAGE) 1000 MG tablet Take 1 tablet (1,000 mg total) by mouth 2 (two) times daily with a meal. 04/11/16   Juline Patch, MD  pantoprazole (PROTONIX) 40 MG tablet Take 1 tablet (40 mg total) by mouth daily. 04/11/16   Juline Patch, MD  predniSONE (DELTASONE) 10 MG tablet Take 1 tablet (10 mg total) by mouth daily with breakfast. 11/21/16   Juline Patch, MD  solifenacin (VESICARE) 5 MG tablet Take 5 mg by mouth as needed.     Historical Provider, MD  sucralfate (CARAFATE) 1 G tablet Take 1 g by mouth as needed.     Historical Provider, MD  Vitamin D, Ergocalciferol, (DRISDOL) 50000 units CAPS capsule Take 1 capsule (50,000 Units total) by mouth every 30 (thirty) days. 04/11/16   Juline Patch, MD    Allergies Aspirin and Sulfa antibiotics  Family History  Problem Relation Age of Onset  . Arthritis Mother   . Cancer Mother     colon  . Arthritis Father   . Cancer Father     colon  . Cancer Sister     lung  . Cancer Other     lung    Social History Social History  Substance Use Topics  . Smoking status: Never Smoker  . Smokeless tobacco: Never Used  . Alcohol use No    Review of Systems Constitutional: No fever/chills Eyes: No visual changes. ENT: No sore throat. Cardiovascular: Denies chest pain. Respiratory: as above Gastrointestinal: No abdominal pain.  No nausea, no vomiting.  No diarrhea.  No constipation. Genitourinary: Negative for dysuria. Musculoskeletal: Negative for back pain. Skin: Negative for rash. Neurological: Negative for headaches, focal weakness or numbness.  10-point ROS otherwise negative.  ____________________________________________   PHYSICAL EXAM:  VITAL SIGNS: ED Triage Vitals  Enc Vitals Group      BP 11/30/16 1039 136/80     Pulse Rate 11/30/16 1037 94     Resp 11/30/16 1037 18     Temp 11/30/16 1037 98.2 F (36.8 C)     Temp Source 11/30/16 1037 Oral     SpO2 11/30/16 1037 96 %     Weight 11/30/16 1038 200 lb (90.7 kg)     Height --      Head Circumference --      Peak Flow --      Pain Score --      Pain Loc --      Pain Edu? --      Excl. in [place]? --     Constitutional: Alert and oriented. Well appearing and in  no acute distress. Eyes: Conjunctivae are normal. PERRL. EOMI. Head: Atraumatic. Nose: No congestion/rhinnorhea. Mouth/Throat: Mucous membranes are moist.  Oropharynx non-erythematous but with clear mucus in the posterior pharynx. Neck: No stridor.   Cardiovascular: Normal rate, regular rhythm. Grossly normal heart sounds.  Good peripheral circulation. Respiratory: Normal respiratory effort.  No retractions. Lungs CTAB. Gastrointestinal: Soft and nontender. No distention.  Musculoskeletal: No lower extremity tenderness nor edema.  No joint effusions. Neurologic:  Normal speech and language. No gross focal neurologic deficits are appreciated.  Skin:  Skin is warm, dry and intact. No rash noted. Psychiatric: Mood and affect are normal. Speech and behavior are normal.  ____________________________________________   LABS (all labs ordered are listed, but only abnormal results are displayed)  Labs Reviewed - No data to display ____________________________________________  EKG   ____________________________________________  RADIOLOGY  DG Chest 2 View (Final result)  Result time 11/30/16 11:29:38  Final result by Lavonia Dana, MD (11/30/16 11:29:38)           Narrative:   CLINICAL DATA: Diagnosed with pneumonia 2 weeks ago, persistent cough despite 2 rounds of antibiotics, history hypertension, diabetes mellitus  EXAM: CHEST 2 VIEW  COMPARISON: 11/16/2016  FINDINGS: Minimal enlargement of cardiac silhouette.  Large hiatal hernia.  Tortuous  aorta.  Pulmonary vascularity normal.  Vague opacity at the LEFT base new since prior study, could represent minimal infiltrate or atelectasis, doubt nodule due to short interval since prior study.  No additional pulmonary infiltrate, pleural effusion or pneumothorax.  Minimal chronic peribronchial thickening centrally.  Bones demineralized.  IMPRESSION: Large hiatal hernia.  Minimal bronchitic changes with question subtle atelectasis or infiltrate at LEFT base.  Followup exam recommended in 2-3 months following resolution of current symptoms to ensure resolution of this LEFT basilar opacity and exclude pulmonary nodule.   Electronically Signed By: Lavonia Dana M.D. On: 11/30/2016 11:29            ____________________________________________   PROCEDURES  Procedure(s) performed:   Procedures  Critical Care performed:   ____________________________________________   INITIAL IMPRESSION / ASSESSMENT AND PLAN / ED COURSE  Pertinent labs & imaging results that were available during my care of the patient were reviewed by me and considered in my medical decision making (see chart for details).  ----------------------------------------- 11:49 AM on 11/30/2016 -----------------------------------------  Patient with minimal bronchitic changes as well as the lateral left a infiltrate at the left base. Patient without any tachycardia or hypoxia. Afebrile. Feel this is likely upper respiratory at this point. Patient has just finished her second course of antibiotics. The patient will be tried on loratadine. We discussed the treatment plan as well as the x-ray findings. She is aware of the need for follow-up exam in 2-3 months on her chest x-ray. She'll follow-up with her primary care doctor. We also discussed return precautions to return to the emergency department for any worsening or concerning symptoms. Likely post nasal drip.       ____________________________________________   FINAL CLINICAL IMPRESSION(S) / ED DIAGNOSES  Cough     NEW MEDICATIONS STARTED DURING THIS VISIT:  New Prescriptions   No medications on file     Note:  This document was prepared using Dragon voice recognition software and may include unintentional dictation errors.    Orbie Pyo, MD 11/30/16 1150

## 2016-12-13 DIAGNOSIS — E1129 Type 2 diabetes mellitus with other diabetic kidney complication: Secondary | ICD-10-CM | POA: Diagnosis not present

## 2016-12-13 DIAGNOSIS — E1142 Type 2 diabetes mellitus with diabetic polyneuropathy: Secondary | ICD-10-CM | POA: Diagnosis not present

## 2016-12-13 DIAGNOSIS — R809 Proteinuria, unspecified: Secondary | ICD-10-CM | POA: Diagnosis not present

## 2016-12-31 ENCOUNTER — Ambulatory Visit (INDEPENDENT_AMBULATORY_CARE_PROVIDER_SITE_OTHER): Payer: Medicare Other | Admitting: Family Medicine

## 2016-12-31 VITALS — BP 110/62 | HR 60 | Ht 63.0 in | Wt 200.0 lb

## 2016-12-31 DIAGNOSIS — D518 Other vitamin B12 deficiency anemias: Secondary | ICD-10-CM | POA: Diagnosis not present

## 2016-12-31 DIAGNOSIS — R350 Frequency of micturition: Secondary | ICD-10-CM

## 2016-12-31 LAB — POCT URINALYSIS DIPSTICK
BILIRUBIN UA: NEGATIVE
Blood, UA: NEGATIVE
Glucose, UA: 100
KETONES UA: NEGATIVE
LEUKOCYTES UA: NEGATIVE
Nitrite, UA: POSITIVE
PROTEIN UA: NEGATIVE
Spec Grav, UA: 1.015 (ref 1.010–1.025)
Urobilinogen, UA: 0.2 E.U./dL
pH, UA: 6 (ref 5.0–8.0)

## 2016-12-31 LAB — POCT CBG (FASTING - GLUCOSE)-MANUAL ENTRY: Glucose Fasting, POC: 188 mg/dL — AB (ref 70–99)

## 2016-12-31 MED ORDER — CYANOCOBALAMIN 1000 MCG/ML IJ SOLN
1000.0000 ug | Freq: Once | INTRAMUSCULAR | Status: AC
  Administered 2016-12-31: 1000 ug via INTRAMUSCULAR

## 2016-12-31 NOTE — Progress Notes (Signed)
Name: Brenda [name]   MRN: [id number]    DOB: [birth year]   Date:12/31/2016       Progress Note  Subjective  Chief Complaint  Chief Complaint  Patient presents with  . B12 Injection    needs shot  . Urinary Frequency    wants urine checked    Urinary Frequency   This is a recurrent problem. The current episode started in the past 7 days. The problem occurs intermittently. The problem has been waxing and waning. There has been no fever. Associated symptoms include frequency. Pertinent negatives include no chills, discharge, flank pain, hematuria, hesitancy, nausea, sweats, urgency or vomiting. The treatment provided mild relief. There is no history of catheterization, kidney stones, recurrent UTIs, a single kidney, urinary stasis or a urological procedure.    No problem-specific Assessment & Plan notes found for this encounter.   Past Medical History:  Diagnosis Date  . Arthritis   . Blood in stool   . Cancer ([place])   . Chicken pox   . Diabetes mellitus 2000  . GERD (gastroesophageal reflux disease)   . Hiatal hernia   . Hypertension   . Kidney infection 2014  . Ovarian cancer ([place]) 1991   s/p hysterectomy at [facility]  . Pernicious anemia   . Spinal stenosis   . Ulcer ([place])   . Urinary incontinence     Past Surgical History:  Procedure Laterality Date  . ABDOMINAL HYSTERECTOMY    . FOOT SURGERY  2000  . SHOULDER SURGERY  2013   fracture right shoulder    Family History  Problem Relation Age of Onset  . Arthritis Mother   . Cancer Mother     colon  . Arthritis Father   . Cancer Father     colon  . Cancer Sister     lung  . Cancer Other     lung    Social History   Social History  . Marital status: Widowed    Spouse name: N/A  . Number of children: N/A  . Years of education: N/A   Occupational History  . Not on file.   Social History Main Topics  . Smoking status: Never Smoker  . Smokeless tobacco: Never Used  . Alcohol use No  . Drug use: No  .  Sexual activity: No   Other Topics Concern  . Not on file   Social History Narrative   Lives alone in [place]. 1 daughter living in [place]. Son died lung cancer.    Allergies  Allergen Reactions  . Aspirin Other (See Comments)    bleeding  . Sulfa Antibiotics Swelling    Outpatient Medications Prior to Visit  Medication Sig Dispense Refill  . albuterol (PROVENTIL HFA;VENTOLIN HFA) 108 (90 Base) MCG/ACT inhaler Inhale 2 puffs into the lungs every 6 (six) hours as needed for wheezing or shortness of breath. 1 Inhaler 0  . cholecalciferol (VITAMIN D) 1000 units tablet Take 2,000 Units by mouth daily.    . cyanocobalamin (,VITAMIN B-12,) 1000 MCG/ML injection Inject 1,000 mcg into the muscle every 30 (thirty) days.    . ferrous sulfate 324 (65 Fe) MG TBEC Take 1 tablet (325 mg total) by mouth daily. 90 tablet 1  . fluticasone furoate-vilanterol (BREO ELLIPTA) 100-25 MCG/INH AEPB Inhale 1 puff into the lungs daily. 60 each 1  . gabapentin (NEURONTIN) 100 MG capsule Take 1 capsule (100 mg total) by mouth as needed. Take one capsule by mouth every night at bedtime. 90 capsule  1  . glipiZIDE (GLUCOTROL XL) 10 MG 24 hr tablet Take 1 tablet (10 mg total) by mouth 2 (two) times daily. 180 tablet 1  . lisinopril (PRINIVIL,ZESTRIL) 10 MG tablet Take 1 tablet (10 mg total) by mouth daily. 90 tablet 1  . loratadine (CLARITIN) 10 MG tablet Take 1 tablet (10 mg total) by mouth daily. 10 tablet 0  . Melatonin 10 MG TABS Take 10 mg by mouth daily.    . metFORMIN (GLUCOPHAGE) 1000 MG tablet Take 1 tablet (1,000 mg total) by mouth 2 (two) times daily with a meal. 180 tablet 1  . pantoprazole (PROTONIX) 40 MG tablet Take 1 tablet (40 mg total) by mouth daily. 90 tablet 1  . solifenacin (VESICARE) 5 MG tablet Take 5 mg by mouth as needed.     . sucralfate (CARAFATE) 1 G tablet Take 1 g by mouth as needed.     . Vitamin D, Ergocalciferol, (DRISDOL) 50000 units CAPS capsule Take 1 capsule (50,000 Units total)  by mouth every 30 (thirty) days. 3 capsule 1  . guaiFENesin-codeine (ROBITUSSIN AC) 100-10 MG/5ML syrup Take 5 mLs by mouth 3 (three) times daily as needed for cough. 150 mL 0  . levofloxacin (LEVAQUIN) 500 MG tablet Take 1 tablet (500 mg total) by mouth daily. 7 tablet 0  . predniSONE (DELTASONE) 10 MG tablet Take 1 tablet (10 mg total) by mouth daily with breakfast. 14 tablet 0   Facility-Administered Medications Prior to Visit  Medication Dose Route Frequency Provider Last Rate Last Dose  . ipratropium-albuterol (DUONEB) 0.5-2.5 (3) MG/3ML nebulizer solution 3 mL  3 mL Nebulization Once Juline Patch, MD        Review of Systems  Constitutional: Negative for chills, fever, malaise/fatigue and weight loss.  HENT: Negative for ear discharge, ear pain and sore throat.   Eyes: Negative for blurred vision.  Respiratory: Negative for cough, sputum production, shortness of breath and wheezing.   Cardiovascular: Negative for chest pain, palpitations and leg swelling.  Gastrointestinal: Negative for abdominal pain, blood in stool, constipation, diarrhea, heartburn, melena, nausea and vomiting.  Genitourinary: Positive for frequency. Negative for dysuria, flank pain, hematuria, hesitancy and urgency.  Musculoskeletal: Negative for back pain, joint pain, myalgias and neck pain.  Skin: Negative for rash.  Neurological: Negative for dizziness, tingling, sensory change, focal weakness and headaches.  Endo/Heme/Allergies: Negative for environmental allergies and polydipsia. Does not bruise/bleed easily.  Psychiatric/Behavioral: Negative for depression and suicidal ideas. The patient is not nervous/anxious and does not have insomnia.      Objective  Vitals:   12/31/16 1344  BP: 110/62  Pulse: 60  Weight: 200 lb (90.7 kg)  Height: 5\' 3"  (1.6 m)    Physical Exam  Constitutional: She is well-developed, well-nourished, and in no distress. No distress.  HENT:  Head: Normocephalic and  atraumatic.  Right Ear: External ear normal.  Left Ear: External ear normal.  Nose: Nose normal.  Mouth/Throat: Oropharynx is clear and moist.  Eyes: Conjunctivae and EOM are normal. Pupils are equal, round, and reactive to light. Right eye exhibits no discharge. Left eye exhibits no discharge.  Neck: Normal range of motion. Neck supple. No JVD present. No thyromegaly present.  Cardiovascular: Normal rate, regular rhythm, normal heart sounds and intact distal pulses.  Exam reveals no gallop and no friction rub.   No murmur heard. Pulmonary/Chest: Effort normal and breath sounds normal. She has no wheezes. She has no rales.  Abdominal: Soft. Bowel sounds are normal. She  exhibits no mass. There is no tenderness. There is no guarding.  Musculoskeletal: Normal range of motion. She exhibits no edema.  Lymphadenopathy:    She has no cervical adenopathy.  Neurological: She is alert.  Skin: Skin is warm and dry. No rash noted. She is not diaphoretic. No erythema.  Psychiatric: Mood and affect normal.  Nursing note and vitals reviewed.     Assessment & Plan  Problem List Items Addressed This Visit      Other   Anemia - Primary   Relevant Medications   cyanocobalamin ((VITAMIN B-12)) injection 1,000 mcg (Completed)    Other Visit Diagnoses    Increased urinary frequency       Relevant Orders   POCT CBG (Fasting - Glucose) (Completed)   POCT Urinalysis Dipstick (Completed)      Meds ordered this encounter  Medications  . cyanocobalamin ((VITAMIN B-12)) injection 1,000 mcg      Dr. Otilio Miu [facility]  12/31/16

## 2017-02-18 ENCOUNTER — Ambulatory Visit (INDEPENDENT_AMBULATORY_CARE_PROVIDER_SITE_OTHER): Payer: Medicare Other

## 2017-02-18 VITALS — BP 160/78 | HR 66 | Temp 98.6°F | Resp 16 | Ht 63.0 in | Wt 200.8 lb

## 2017-02-18 DIAGNOSIS — Z0000 Encounter for general adult medical examination without abnormal findings: Secondary | ICD-10-CM | POA: Diagnosis not present

## 2017-02-18 DIAGNOSIS — E538 Deficiency of other specified B group vitamins: Secondary | ICD-10-CM

## 2017-02-18 DIAGNOSIS — D519 Vitamin B12 deficiency anemia, unspecified: Secondary | ICD-10-CM | POA: Diagnosis not present

## 2017-02-18 MED ORDER — CYANOCOBALAMIN 1000 MCG/ML IJ SOLN
1000.0000 ug | Freq: Once | INTRAMUSCULAR | Status: AC
  Administered 2017-02-18: 1000 ug via INTRAMUSCULAR

## 2017-02-18 NOTE — Progress Notes (Signed)
Subjective:   Brenda [name] is a 83 y.o. female who presents for Medicare Annual (Subsequent) preventive examination.  Review of Systems:   Cardiac Risk Factors include: advanced age (>61men, >37 women);hypertension;diabetes mellitus;obesity (BMI >30kg/m2);dyslipidemia     Objective:     Vitals: BP (!) 160/78 (BP Location: Right Arm, Patient Position: Sitting)   Pulse 66   Temp 98.6 F (37 C)   Resp 16   Ht 5\' 3"  (1.6 m)   Wt 200 lb 12.8 oz (91.1 kg)   BMI 35.57 kg/m   Body mass index is 35.57 kg/m.   Tobacco History  Smoking Status  . Never Smoker  Smokeless Tobacco  . Never Used     Counseling given: Not Answered   Past Medical History:  Diagnosis Date  . Arthritis   . Blood in stool   . Cancer ([place])   . Chicken pox   . Diabetes mellitus 2000  . GERD (gastroesophageal reflux disease)   . Hiatal hernia   . Hypertension   . Kidney infection 2014  . Ovarian cancer ([place]) 1991   s/p [facility]  . Pernicious anemia   . Spinal stenosis   . Ulcer   . Urinary incontinence    Past Surgical History:  Procedure Laterality Date  . ABDOMINAL HYSTERECTOMY    . FOOT SURGERY  2000  . SHOULDER SURGERY  2013   fracture right shoulder   Family History  Problem Relation Age of Onset  . Arthritis Mother   . Cancer Mother        colon  . Arthritis Father   . Cancer Father        colon  . Cancer Sister        lung  . Cancer Other        lung   History  Sexual Activity  . Sexual activity: No    Outpatient Encounter Prescriptions as of 02/18/2017  Medication Sig  . albuterol (PROVENTIL HFA;VENTOLIN HFA) 108 (90 Base) MCG/ACT inhaler Inhale 2 puffs into the lungs every 6 (six) hours as needed for wheezing or shortness of breath.  . cholecalciferol (VITAMIN D) 1000 units tablet Take 2,000 Units by mouth daily.  . cyanocobalamin (,VITAMIN B-12,) 1000 MCG/ML injection Inject 1,000 mcg into the muscle every 30 (thirty) days.  . ferrous sulfate 324 (65  Fe) MG TBEC Take 1 tablet (325 mg total) by mouth daily.  . fluticasone furoate-vilanterol (BREO ELLIPTA) 100-25 MCG/INH AEPB Inhale 1 puff into the lungs daily.  Marland Kitchen gabapentin (NEURONTIN) 100 MG capsule Take 1 capsule (100 mg total) by mouth as needed. Take one capsule by mouth every night at bedtime.  Marland Kitchen glipiZIDE (GLUCOTROL XL) 10 MG 24 hr tablet Take 1 tablet (10 mg total) by mouth 2 (two) times daily.  Marland Kitchen lisinopril (PRINIVIL,ZESTRIL) 10 MG tablet Take 1 tablet (10 mg total) by mouth daily.  . Melatonin 10 MG TABS Take 10 mg by mouth daily.  . metFORMIN (GLUCOPHAGE) 1000 MG tablet Take 1 tablet (1,000 mg total) by mouth 2 (two) times daily with a meal.  . pantoprazole (PROTONIX) 40 MG tablet Take 1 tablet (40 mg total) by mouth daily.  . Vitamin D, Ergocalciferol, (DRISDOL) 50000 units CAPS capsule Take 1 capsule (50,000 Units total) by mouth every 30 (thirty) days.  Marland Kitchen solifenacin (VESICARE) 5 MG tablet Take 5 mg by mouth as needed.   . sucralfate (CARAFATE) 1 G tablet Take 1 g by mouth as needed.   . [  DISCONTINUED] loratadine (CLARITIN) 10 MG tablet Take 1 tablet (10 mg total) by mouth daily. (Patient not taking: Reported on 02/18/2017)   Facility-Administered Encounter Medications as of 02/18/2017  Medication  . [COMPLETED] cyanocobalamin ((VITAMIN B-12)) injection 1,000 mcg  . ipratropium-albuterol (DUONEB) 0.5-2.5 (3) MG/3ML nebulizer solution 3 mL    Activities of Daily Living In your present state of health, do you have any difficulty performing the following activities: 02/18/2017  Hearing? Y  Vision? Y  Difficulty concentrating or making decisions? N  Walking or climbing stairs? Y  Dressing or bathing? N  Doing errands, shopping? N  Preparing Food and eating ? N  Using the Toilet? N  In the past six months, have you accidently leaked urine? Y  Do you have problems with loss of bowel control? N  Managing your Medications? N  Managing your Finances? N  Housekeeping or managing  your Housekeeping? N  Some recent data might be hidden    Patient Care Team: Juline Patch, MD as PCP - General (Family Medicine)    Assessment:     Exercise Activities and Dietary recommendations Current Exercise Habits: The patient does not participate in regular exercise at present, Exercise limited by: None identified  Goals    . Exercise 150 minutes per week (moderate activity)          Recommend increasing exercise to help promote better sleep      Fall Risk Fall Risk  02/18/2017 01/23/2016 11/04/2015 10/14/2015 01/10/2015  Falls in the past year? No No No No No  Number falls in past yr: - - - - -  Injury with Fall? - - - - -  Risk Factor Category  - - - - -   Depression Screen PHQ 2/9 Scores 02/18/2017 01/23/2016 11/04/2015 10/14/2015  PHQ - 2 Score 0 0 0 0  PHQ- 9 Score - - - -     Cognitive Function     6CIT Screen 02/18/2017  What Year? 0 points  What month? 0 points  What time? 0 points  Count back from 20 0 points  Months in reverse 0 points  Repeat phrase 0 points  Total Score 0    Immunization History  Administered Date(s) Administered  . Influenza Split 06/18/2012, 06/18/2015, 06/15/2016  . Influenza,inj,Quad PF,36+ Mos 06/25/2013, 07/09/2014  . Pneumococcal Conjugate-13 01/01/2014  . Pneumococcal-Unspecified 08/29/2011   Screening Tests Health Maintenance  Topic Date Due  . FOOT EXAM  02/16/2016  . OPHTHALMOLOGY EXAM  03/10/2017  . INFLUENZA VACCINE  04/10/2017  . HEMOGLOBIN A1C  05/19/2017  . TETANUS/TDAP  07/23/2017  . DEXA SCAN  Completed      Plan:    I have personally reviewed and addressed the Medicare Annual Wellness questionnaire and have noted the following in the patient's chart:  A. Medical and social history B. Use of alcohol, tobacco or illicit drugs  C. Current medications and supplements D. Functional ability and status E.  Nutritional status F.  Physical activity  G. Advance directives H. List of other physicians I.    Hospitalizations, surgeries, and ER visits in previous 12 months J.  [place] such as hearing and vision if needed, cognitive and depression L. Referrals and appointments  In addition, I have reviewed and discussed with patient certain preventive protocols, quality metrics, and best practice recommendations. A written personalized care plan for preventive services as well as general preventive health recommendations were provided to patient.   Signed,  Tyler Aas, LPN Nurse  Health Advisor   MD Recommendations: needs diabetic foot exam

## 2017-02-18 NOTE — Patient Instructions (Addendum)
Brenda [name] , Thank you for taking time to come for your Medicare Wellness Visit. I appreciate your ongoing commitment to your health goals. Please review the following plan we discussed and let me know if I can assist you in the future.   Screening recommendations/referrals: Colonoscopy: Completed 05/30/2009, no longer required Mammogram: Completed 09/28/2015, no longer required Bone Density: Completed 06/19/2012 Recommended yearly ophthalmology/optometry visit for glaucoma screening and checkup Recommended yearly dental visit for hygiene and checkup  Vaccinations: Influenza vaccine: up to date, due 06/2017 Pneumococcal vaccine: competed series Tdap vaccine: due, check with your insurance company for coverage Shingles vaccine:  due, check with your insurance company for coverage    Advanced directives: Advance directive discussed with you today. I have provided a copy for you to complete at home and have notarized. Once this is complete please bring a copy in to our office so we can scan it into your chart.  Conditions/risks identified: Recommend increasing exercise to help promote better sleep  Next appointment: Follow up in one year for your annual wellness exam.    Preventive Care 81 Years and Older, Female Preventive care refers to lifestyle choices and visits with your health care provider that can promote health and wellness. What does preventive care include?  A yearly physical exam. This is also called an annual well check.  Dental exams once or twice a year.  Routine eye exams. Ask your health care provider how often you should have your eyes checked.  Personal lifestyle choices, including:  Daily care of your teeth and gums.  Regular physical activity.  Eating a healthy diet.  Avoiding tobacco and drug use.  Limiting alcohol use.  Practicing safe sex.  Taking low-dose aspirin every day.  Taking vitamin and mineral supplements as recommended by your health care  provider. What happens during an annual well check? The services and screenings done by your health care provider during your annual well check will depend on your age, overall health, lifestyle risk factors, and family history of disease. Counseling  Your health care provider may ask you questions about your:  Alcohol use.  Tobacco use.  Drug use.  Emotional well-being.  Home and relationship well-being.  Sexual activity.  Eating habits.  History of falls.  Memory and ability to understand (cognition).  Work and work Statistician.  Reproductive health. Screening  You may have the following tests or measurements:  Height, weight, and BMI.  Blood pressure.  Lipid and cholesterol levels. These may be checked every 5 years, or more frequently if you are over 71 years old.  Skin check.  Lung cancer screening. You may have this screening every year starting at age 60 if you have a 30-pack-year history of smoking and currently smoke or have quit within the past 15 years.  Fecal occult blood test (FOBT) of the stool. You may have this test every year starting at age 81.  Flexible sigmoidoscopy or colonoscopy. You may have a sigmoidoscopy every 5 years or a colonoscopy every 10 years starting at age 81.  Hepatitis C blood test.  Hepatitis B blood test.  Sexually transmitted disease (STD) testing.  Diabetes screening. This is done by checking your blood sugar (glucose) after you have not eaten for a while (fasting). You may have this done every 1-3 years.  Bone density scan. This is done to screen for osteoporosis. You may have this done starting at age 81.  Mammogram. This may be done every 1-2 years. Talk to your health  care provider about how often you should have regular mammograms. Talk with your health care provider about your test results, treatment options, and if necessary, the need for more tests. Vaccines  Your health care provider may recommend certain  vaccines, such as:  Influenza vaccine. This is recommended every year.  Tetanus, diphtheria, and acellular pertussis (Tdap, Td) vaccine. You may need a Td booster every 10 years.  Zoster vaccine. You may need this after age 81.  Pneumococcal 13-valent conjugate (PCV13) vaccine. One dose is recommended after age 81.  Pneumococcal polysaccharide (PPSV23) vaccine. One dose is recommended after age 81. Talk to your health care provider about which screenings and vaccines you need and how often you need them. This information is not intended to replace advice given to you by your health care provider. Make sure you discuss any questions you have with your health care provider. Document Released: 09/23/2015 Document Revised: 05/16/2016 Document Reviewed: 06/28/2015 Elsevier Interactive Patient Education  2017 [place] Prevention in the Home Falls can cause injuries. They can happen to people of all ages. There are many things you can do to make your home safe and to help prevent falls. What can I do on the outside of my home?  Regularly fix the edges of walkways and driveways and fix any cracks.  Remove anything that might make you trip as you walk through a door, such as a raised step or threshold.  Trim any bushes or trees on the path to your home.  Use bright outdoor lighting.  Clear any walking paths of anything that might make someone trip, such as rocks or tools.  Regularly check to see if handrails are loose or broken. Make sure that both sides of any steps have handrails.  Any raised decks and porches should have guardrails on the edges.  Have any leaves, snow, or ice cleared regularly.  Use sand or salt on walking paths during winter.  Clean up any spills in your garage right away. This includes oil or grease spills. What can I do in the bathroom?  Use night lights.  Install grab bars by the toilet and in the tub and shower. Do not use towel bars as grab  bars.  Use non-skid mats or decals in the tub or shower.  If you need to sit down in the shower, use a plastic, non-slip stool.  Keep the floor dry. Clean up any water that spills on the floor as soon as it happens.  Remove soap buildup in the tub or shower regularly.  Attach bath mats securely with double-sided non-slip rug tape.  Do not have throw rugs and other things on the floor that can make you trip. What can I do in the bedroom?  Use night lights.  Make sure that you have a light by your bed that is easy to reach.  Do not use any sheets or blankets that are too big for your bed. They should not hang down onto the floor.  Have a firm chair that has side arms. You can use this for support while you get dressed.  Do not have throw rugs and other things on the floor that can make you trip. What can I do in the kitchen?  Clean up any spills right away.  Avoid walking on wet floors.  Keep items that you use a lot in easy-to-reach places.  If you need to reach something above you, use a strong step stool that has a grab  bar.  Keep electrical cords out of the way.  Do not use floor polish or wax that makes floors slippery. If you must use wax, use non-skid floor wax.  Do not have throw rugs and other things on the floor that can make you trip. What can I do with my stairs?  Do not leave any items on the stairs.  Make sure that there are handrails on both sides of the stairs and use them. Fix handrails that are broken or loose. Make sure that handrails are as long as the stairways.  Check any carpeting to make sure that it is firmly attached to the stairs. Fix any carpet that is loose or worn.  Avoid having throw rugs at the top or bottom of the stairs. If you do have throw rugs, attach them to the floor with carpet tape.  Make sure that you have a light switch at the top of the stairs and the bottom of the stairs. If you do not have them, ask someone to add them for  you. What else can I do to help prevent falls?  Wear shoes that:  Do not have high heels.  Have rubber bottoms.  Are comfortable and fit you well.  Are closed at the toe. Do not wear sandals.  If you use a stepladder:  Make sure that it is fully opened. Do not climb a closed stepladder.  Make sure that both sides of the stepladder are locked into place.  Ask someone to hold it for you, if possible.  Clearly mark and make sure that you can see:  Any grab bars or handrails.  First and last steps.  Where the edge of each step is.  Use tools that help you move around (mobility aids) if they are needed. These include:  Canes.  Walkers.  Scooters.  Crutches.  Turn on the lights when you go into a dark area. Replace any light bulbs as soon as they burn out.  Set up your furniture so you have a clear path. Avoid moving your furniture around.  If any of your floors are uneven, fix them.  If there are any pets around you, be aware of where they are.  Review your medicines with your doctor. Some medicines can make you feel dizzy. This can increase your chance of falling. Ask your doctor what other things that you can do to help prevent falls. This information is not intended to replace advice given to you by your health care provider. Make sure you discuss any questions you have with your health care provider. Document Released: 06/23/2009 Document Revised: 02/02/2016 Document Reviewed: 10/01/2014 Elsevier Interactive Patient Education  2017 [place], Vitamin B12 injection What is this medicine? CYANOCOBALAMIN (sye an oh koe BAL a min) is a man made form of vitamin B12. Vitamin B12 is used in the growth of healthy blood cells, nerve cells, and proteins in the body. It also helps with the metabolism of fats and carbohydrates. This medicine is used to treat people who can not absorb vitamin B12. This medicine may be used for other purposes; ask your  health care provider or pharmacist if you have questions. COMMON BRAND NAME(S): B-12 Compliance Kit, B-12 Injection Kit, Cyomin, LA-12, Nutri-Twelve, Physicians EZ Use B-12, Primabalt What should I tell my health care provider before I take this medicine? They need to know if you have any of these conditions: -kidney disease -Leber's disease -megaloblastic anemia -an unusual or allergic reaction to cyanocobalamin,  cobalt, other medicines, foods, dyes, or preservatives -pregnant or trying to get pregnant -breast-feeding How should I use this medicine? This medicine is injected into a muscle or deeply under the skin. It is usually given by a health care professional in a clinic or doctor's office. However, your doctor may teach you how to inject yourself. Follow all instructions. Talk to your pediatrician regarding the use of this medicine in children. Special care may be needed. Overdosage: If you think you have taken too much of this medicine contact a poison control center or emergency room at once. NOTE: This medicine is only for you. Do not share this medicine with others. What if I miss a dose? If you are given your dose at a clinic or doctor's office, call to reschedule your appointment. If you give your own injections and you miss a dose, take it as soon as you can. If it is almost time for your next dose, take only that dose. Do not take double or extra doses. What may interact with this medicine? -colchicine -heavy alcohol intake This list may not describe all possible interactions. Give your health care provider a list of all the medicines, herbs, non-prescription drugs, or dietary supplements you use. Also tell them if you smoke, drink alcohol, or use illegal drugs. Some items may interact with your medicine. What should I watch for while using this medicine? Visit your doctor or health care professional regularly. You may need blood work done while you are taking this medicine. You  may need to follow a special diet. Talk to your doctor. Limit your alcohol intake and avoid smoking to get the best benefit. What side effects may I notice from receiving this medicine? Side effects that you should report to your doctor or health care professional as soon as possible: -allergic reactions like skin rash, itching or hives, swelling of the face, lips, or tongue -blue tint to skin -chest tightness, pain -difficulty breathing, wheezing -dizziness -red, swollen painful area on the leg Side effects that usually do not require medical attention (report to your doctor or health care professional if they continue or are bothersome): -diarrhea -headache This list may not describe all possible side effects. Call your doctor for medical advice about side effects. You may report side effects to FDA at 1-800-FDA-1088. Where should I keep my medicine? Keep out of the reach of children. Store at room temperature between 15 and 30 degrees C (59 and 85 degrees F). Protect from light. Throw away any unused medicine after the expiration date. NOTE: This sheet is a summary. It may not cover all possible information. If you have questions about this medicine, talk to your doctor, pharmacist, or health care provider.  2018 Elsevier/Gold Standard (2007-12-08 22:10:20)

## 2017-02-23 ENCOUNTER — Other Ambulatory Visit: Payer: Self-pay | Admitting: Family Medicine

## 2017-02-23 DIAGNOSIS — I10 Essential (primary) hypertension: Secondary | ICD-10-CM

## 2017-03-19 DIAGNOSIS — E538 Deficiency of other specified B group vitamins: Secondary | ICD-10-CM | POA: Diagnosis not present

## 2017-03-19 DIAGNOSIS — E11649 Type 2 diabetes mellitus with hypoglycemia without coma: Secondary | ICD-10-CM | POA: Diagnosis not present

## 2017-03-19 DIAGNOSIS — E559 Vitamin D deficiency, unspecified: Secondary | ICD-10-CM | POA: Diagnosis not present

## 2017-03-19 DIAGNOSIS — E1142 Type 2 diabetes mellitus with diabetic polyneuropathy: Secondary | ICD-10-CM | POA: Diagnosis not present

## 2017-03-19 DIAGNOSIS — E669 Obesity, unspecified: Secondary | ICD-10-CM | POA: Diagnosis not present

## 2017-03-19 DIAGNOSIS — R809 Proteinuria, unspecified: Secondary | ICD-10-CM | POA: Diagnosis not present

## 2017-03-19 DIAGNOSIS — E1129 Type 2 diabetes mellitus with other diabetic kidney complication: Secondary | ICD-10-CM | POA: Diagnosis not present

## 2017-04-02 ENCOUNTER — Ambulatory Visit (INDEPENDENT_AMBULATORY_CARE_PROVIDER_SITE_OTHER): Payer: Medicare Other | Admitting: Family Medicine

## 2017-04-02 VITALS — BP 120/64 | HR 68 | Ht 63.0 in | Wt 211.0 lb

## 2017-04-02 DIAGNOSIS — N318 Other neuromuscular dysfunction of bladder: Secondary | ICD-10-CM | POA: Diagnosis not present

## 2017-04-02 DIAGNOSIS — N3941 Urge incontinence: Secondary | ICD-10-CM

## 2017-04-02 DIAGNOSIS — E118 Type 2 diabetes mellitus with unspecified complications: Secondary | ICD-10-CM | POA: Diagnosis not present

## 2017-04-02 LAB — POCT URINALYSIS DIPSTICK
BILIRUBIN UA: NEGATIVE
Blood, UA: NEGATIVE
Glucose, UA: NEGATIVE
Ketones, UA: NEGATIVE
Leukocytes, UA: NEGATIVE
NITRITE UA: NEGATIVE
PH UA: 6 (ref 5.0–8.0)
PROTEIN UA: NEGATIVE
Spec Grav, UA: 1.02 (ref 1.010–1.025)
Urobilinogen, UA: 0.2 E.U./dL

## 2017-04-02 LAB — POCT CBG (FASTING - GLUCOSE)-MANUAL ENTRY: Glucose Fasting, POC: 166 mg/dL — AB (ref 70–99)

## 2017-04-02 MED ORDER — SOLIFENACIN SUCCINATE 10 MG PO TABS
10.0000 mg | ORAL_TABLET | Freq: Every day | ORAL | 5 refills | Status: DC
Start: 2017-04-02 — End: 2018-02-21

## 2017-04-02 NOTE — Progress Notes (Signed)
Name: Brenda [name]   MRN: [id number]    DOB: [birth year]   Date:04/02/2017       Progress Note  Subjective  Chief Complaint  Chief Complaint  Patient presents with  . Urinary Tract Infection    has been urinating "all night long"    Urinary Tract Infection   This is a new problem. The current episode started in the past 7 days. The problem occurs every urination. The problem has been gradually worsening. There has been no fever. Pertinent negatives include no chills, discharge, flank pain, frequency, hematuria, hesitancy, nausea, sweats, urgency or vomiting. The treatment provided moderate relief. Her past medical history is significant for recurrent UTIs.  Urinary Frequency   This is a recurrent problem. The problem occurs intermittently. The problem has been waxing and waning. The quality of the pain is described as aching. The pain is moderate. Pertinent negatives include no chills, discharge, flank pain, frequency, hematuria, hesitancy, nausea, sweats, urgency or vomiting. Her past medical history is significant for recurrent UTIs.    No problem-specific Assessment & Plan notes found for this encounter.   Past Medical History:  Diagnosis Date  . Arthritis   . Blood in stool   . Cancer ([place])   . Chicken pox   . Diabetes mellitus 2000  . GERD (gastroesophageal reflux disease)   . Hiatal hernia   . Hypertension   . Kidney infection 2014  . Ovarian cancer ([place]) 1991   s/p hysterectomy at [facility]  . Pernicious anemia   . Spinal stenosis   . Ulcer   . Urinary incontinence     Past Surgical History:  Procedure Laterality Date  . ABDOMINAL HYSTERECTOMY    . FOOT SURGERY  2000  . SHOULDER SURGERY  2013   fracture right shoulder    Family History  Problem Relation Age of Onset  . Arthritis Mother   . Cancer Mother        colon  . Arthritis Father   . Cancer Father        colon  . Cancer Sister        lung  . Cancer Other        lung    Social History   Social  History  . Marital status: Widowed    Spouse name: N/A  . Number of children: N/A  . Years of education: N/A   Occupational History  . Not on file.   Social History Main Topics  . Smoking status: Never Smoker  . Smokeless tobacco: Never Used  . Alcohol use No  . Drug use: No  . Sexual activity: No   Other Topics Concern  . Not on file   Social History Narrative   Lives alone in [place]. 1 daughter living in [place]. Son died lung cancer.    Allergies  Allergen Reactions  . Aspirin Other (See Comments)    bleeding  . Sulfa Antibiotics Swelling  . Oxybutynin Other (See Comments)    Dry mouth    Outpatient Medications Prior to Visit  Medication Sig Dispense Refill  . albuterol (PROVENTIL HFA;VENTOLIN HFA) 108 (90 Base) MCG/ACT inhaler Inhale 2 puffs into the lungs every 6 (six) hours as needed for wheezing or shortness of breath. 1 Inhaler 0  . cholecalciferol (VITAMIN D) 1000 units tablet Take 2,000 Units by mouth daily.    . cyanocobalamin (,VITAMIN B-12,) 1000 MCG/ML injection Inject 1,000 mcg into the muscle every 30 (thirty) days.    . ferrous sulfate  324 (65 Fe) MG TBEC Take 1 tablet (325 mg total) by mouth daily. 90 tablet 1  . fluticasone furoate-vilanterol (BREO ELLIPTA) 100-25 MCG/INH AEPB Inhale 1 puff into the lungs daily. 60 each 1  . gabapentin (NEURONTIN) 100 MG capsule Take 1 capsule (100 mg total) by mouth as needed. Take one capsule by mouth every night at bedtime. 90 capsule 1  . glipiZIDE (GLUCOTROL XL) 10 MG 24 hr tablet Take 1 tablet (10 mg total) by mouth 2 (two) times daily. 180 tablet 1  . lisinopril (PRINIVIL,ZESTRIL) 10 MG tablet TAKE 1 TABLET(10 MG) BY MOUTH DAILY 90 tablet 0  . Melatonin 10 MG TABS Take 10 mg by mouth daily.    . metFORMIN (GLUCOPHAGE) 1000 MG tablet Take 1 tablet (1,000 mg total) by mouth 2 (two) times daily with a meal. 180 tablet 1  . pantoprazole (PROTONIX) 40 MG tablet Take 1 tablet (40 mg total) by mouth daily. 90 tablet 1  .  sucralfate (CARAFATE) 1 G tablet Take 1 g by mouth as needed.     . Vitamin D, Ergocalciferol, (DRISDOL) 50000 units CAPS capsule Take 1 capsule (50,000 Units total) by mouth every 30 (thirty) days. 3 capsule 1  . solifenacin (VESICARE) 5 MG tablet Take 5 mg by mouth as needed.      Facility-Administered Medications Prior to Visit  Medication Dose Route Frequency Provider Last Rate Last Dose  . ipratropium-albuterol (DUONEB) 0.5-2.5 (3) MG/3ML nebulizer solution 3 mL  3 mL Nebulization Once Juline Patch, MD        Review of Systems  Constitutional: Negative for chills, fever, malaise/fatigue and weight loss.  HENT: Negative for ear discharge, ear pain and sore throat.   Eyes: Negative for blurred vision.  Respiratory: Negative for cough, sputum production, shortness of breath and wheezing.   Cardiovascular: Negative for chest pain, palpitations and leg swelling.  Gastrointestinal: Negative for abdominal pain, blood in stool, constipation, diarrhea, heartburn, melena, nausea and vomiting.  Genitourinary: Negative for dysuria, flank pain, frequency, hematuria, hesitancy and urgency.  Musculoskeletal: Negative for back pain, joint pain, myalgias and neck pain.  Skin: Negative for rash.  Neurological: Negative for dizziness, tingling, sensory change, focal weakness and headaches.  Endo/Heme/Allergies: Negative for environmental allergies and polydipsia. Does not bruise/bleed easily.  Psychiatric/Behavioral: Negative for depression and suicidal ideas. The patient is not nervous/anxious and does not have insomnia.      Objective  Vitals:   04/02/17 1333  BP: 120/64  Pulse: 68  Weight: 211 lb (95.7 kg)  Height: 5\' 3"  (1.6 m)    Physical Exam  Constitutional: She is well-developed, well-nourished, and in no distress. No distress.  HENT:  Head: Normocephalic and atraumatic.  Right Ear: Tympanic membrane, external ear and ear canal normal.  Left Ear: Tympanic membrane, external ear  and ear canal normal.  Nose: Nose normal.  Mouth/Throat: Oropharynx is clear and moist.  Eyes: Pupils are equal, round, and reactive to light. Conjunctivae and EOM are normal. Right eye exhibits no discharge. Left eye exhibits no discharge.  Neck: Normal range of motion. Neck supple. No JVD present. No thyromegaly present.  Cardiovascular: Normal rate, regular rhythm, normal heart sounds and intact distal pulses.  Exam reveals no gallop and no friction rub.   No murmur heard. Pulmonary/Chest: Effort normal and breath sounds normal. She has no wheezes. She has no rales.  Abdominal: Soft. Bowel sounds are normal. She exhibits no mass. There is no tenderness. There is no guarding.  Musculoskeletal:  Normal range of motion. She exhibits no edema.  Lymphadenopathy:    She has no cervical adenopathy.  Neurological: She is alert. She has normal reflexes.  Skin: Skin is warm and dry. She is not diaphoretic.  Psychiatric: Mood and affect normal.  Nursing note and vitals reviewed.     Assessment & Plan  Problem List Items Addressed This Visit      Endocrine   Diabetes mellitus type 2 with complications ([place])   Relevant Orders   POCT urinalysis dipstick (Completed)   POCT CBG (Fasting - Glucose) (Completed)    Other Visit Diagnoses    Urge incontinence    -  Primary   Relevant Medications   solifenacin (VESICARE) 10 MG tablet   Other Relevant Orders   POCT urinalysis dipstick (Completed)   POCT CBG (Fasting - Glucose) (Completed)   Frequency-urgency syndrome       Relevant Orders   Ambulatory referral to Urology      Meds ordered this encounter  Medications  . solifenacin (VESICARE) 10 MG tablet    Sig: Take 1 tablet (10 mg total) by mouth daily.    Dispense:  30 tablet    Refill:  5      Dr. Otilio Miu [facility]  04/02/17

## 2017-04-08 DIAGNOSIS — N3281 Overactive bladder: Secondary | ICD-10-CM | POA: Diagnosis not present

## 2017-04-08 DIAGNOSIS — N3941 Urge incontinence: Secondary | ICD-10-CM | POA: Diagnosis not present

## 2017-04-08 DIAGNOSIS — Z6837 Body mass index (BMI) 37.0-37.9, adult: Secondary | ICD-10-CM | POA: Diagnosis not present

## 2017-04-08 DIAGNOSIS — N390 Urinary tract infection, site not specified: Secondary | ICD-10-CM | POA: Diagnosis not present

## 2017-04-15 ENCOUNTER — Other Ambulatory Visit: Payer: Self-pay | Admitting: Family Medicine

## 2017-04-15 DIAGNOSIS — E118 Type 2 diabetes mellitus with unspecified complications: Secondary | ICD-10-CM

## 2017-05-09 ENCOUNTER — Emergency Department
Admission: EM | Admit: 2017-05-09 | Discharge: 2017-05-09 | Disposition: A | Discharge status: Home / Self Care | Payer: Medicare Other | Attending: Emergency Medicine | Admitting: Emergency Medicine

## 2017-05-09 ENCOUNTER — Encounter: Payer: Self-pay | Admitting: Emergency Medicine

## 2017-05-09 DIAGNOSIS — Y939 Activity, unspecified: Secondary | ICD-10-CM | POA: Diagnosis not present

## 2017-05-09 DIAGNOSIS — Y929 Unspecified place or not applicable: Secondary | ICD-10-CM | POA: Insufficient documentation

## 2017-05-09 DIAGNOSIS — E119 Type 2 diabetes mellitus without complications: Secondary | ICD-10-CM | POA: Diagnosis not present

## 2017-05-09 DIAGNOSIS — S0502XA Injury of conjunctiva and corneal abrasion without foreign body, left eye, initial encounter: Secondary | ICD-10-CM | POA: Diagnosis not present

## 2017-05-09 DIAGNOSIS — Y999 Unspecified external cause status: Secondary | ICD-10-CM | POA: Diagnosis not present

## 2017-05-09 DIAGNOSIS — G8929 Other chronic pain: Secondary | ICD-10-CM | POA: Insufficient documentation

## 2017-05-09 DIAGNOSIS — I10 Essential (primary) hypertension: Secondary | ICD-10-CM | POA: Diagnosis not present

## 2017-05-09 DIAGNOSIS — Z79899 Other long term (current) drug therapy: Secondary | ICD-10-CM | POA: Diagnosis not present

## 2017-05-09 DIAGNOSIS — X58XXXA Exposure to other specified factors, initial encounter: Secondary | ICD-10-CM | POA: Insufficient documentation

## 2017-05-09 DIAGNOSIS — Z7984 Long term (current) use of oral hypoglycemic drugs: Secondary | ICD-10-CM | POA: Diagnosis not present

## 2017-05-09 DIAGNOSIS — S0501XA Injury of conjunctiva and corneal abrasion without foreign body, right eye, initial encounter: Secondary | ICD-10-CM | POA: Diagnosis not present

## 2017-05-09 DIAGNOSIS — H5713 Ocular pain, bilateral: Secondary | ICD-10-CM | POA: Diagnosis present

## 2017-05-09 MED ORDER — GENTAMICIN SULFATE 0.3 % OP SOLN: 1.0000 [drp] | OPHTHALMIC | 0 refills | Status: DC

## 2017-05-09 MED ORDER — EYE WASH OPHTH SOLN
OPHTHALMIC | Status: AC
  Filled 2017-05-09: qty 118

## 2017-05-09 MED ORDER — TETRACAINE HCL 0.5 % OP SOLN
OPHTHALMIC | Status: AC
  Filled 2017-05-09: qty 4

## 2017-05-09 MED ORDER — FLUORESCEIN SODIUM 0.6 MG OP STRP
ORAL_STRIP | OPHTHALMIC | Status: AC
  Filled 2017-05-09: qty 1

## 2017-05-09 MED ORDER — NAPHAZOLINE-PHENIRAMINE 0.025-0.3 % OP SOLN: 1.0000 [drp] | Freq: Four times a day (QID) | OPHTHALMIC | 0 refills | Status: DC | PRN

## 2017-05-09 NOTE — ED Provider Notes (Signed)
[facility] Emergency Department Provider Note   ____________________________________________   First MD Initiated Contact with Patient 05/09/17 2032     (approximate)  I have reviewed the triage vital signs and the nursing notes.   HISTORY  Chief Complaint Eye Problem    HPI Brenda [name] is a 83 y.o. female patient complaining of bilateral eye pain since 2:00 this afternoon. Patient state file like sandpaper's in her eyes. Patient states she put lotion on her face this morning and spent some time at the pool this evening. Patient states she went in to take a nap and woke up with bilateral eye pain. Patient rates the pain as 8/10. No palliative measures for complaint. Patient states also photophobic.   Past Medical History:  Diagnosis Date  . Arthritis   . Blood in stool   . Cancer ([place])   . Chicken pox   . Diabetes mellitus 2000  . GERD (gastroesophageal reflux disease)   . Hiatal hernia   . Hypertension   . Kidney infection 2014  . Ovarian cancer ([place]) 1991   s/p hysterectomy at [facility]  . Pernicious anemia   . Spinal stenosis   . Ulcer   . Urinary incontinence     Patient Active Problem List   Diagnosis Date Noted  . Screening for breast cancer 08/18/2015  . B12 deficiency 12/02/2014  . Lumbago 07/09/2014  . Shortness of breath 06/30/2014  . Obese 06/30/2014  . Right facial pain 05/18/2014  . Anemia 01/01/2014  . Osteoarthritis 07/31/2013  . Other malaise and fatigue 07/09/2013  . Muscle cramp, nocturnal 03/25/2013  . Medicare annual wellness visit, subsequent 12/02/2012  . Dermatitis 12/02/2012  . Other and unspecified hyperlipidemia 12/02/2012  . Essential hypertension, benign 12/02/2012  . Chronic low back pain 08/28/2012  . Osteoporosis 07/23/2012  . Paresthesia of bilateral legs 05/30/2012  . Pernicious anemia 05/30/2012  . Diabetes mellitus type 2 with complications ([place]) 78/24/2353  . Depression 05/30/2012  . Insomnia  05/30/2012  . Weakness of both legs 05/30/2012    Past Surgical History:  Procedure Laterality Date  . ABDOMINAL HYSTERECTOMY    . FOOT SURGERY  2000  . SHOULDER SURGERY  2013   fracture right shoulder    Prior to Admission medications   Medication Sig Start Date End Date Taking? Authorizing Provider  albuterol (PROVENTIL HFA;VENTOLIN HFA) 108 (90 Base) MCG/ACT inhaler Inhale 2 puffs into the lungs every 6 (six) hours as needed for wheezing or shortness of breath. 11/16/16   Juline Patch, MD  cholecalciferol (VITAMIN D) 1000 units tablet Take 2,000 Units by mouth daily.    [provider]  cyanocobalamin (,VITAMIN B-12,) 1000 MCG/ML injection Inject 1,000 mcg into the muscle every 30 (thirty) days.    [provider]  ferrous sulfate 324 (65 Fe) MG TBEC Take 1 tablet (325 mg total) by mouth daily. 04/11/16   Juline Patch, MD  fluticasone furoate-vilanterol (BREO ELLIPTA) 100-25 MCG/INH AEPB Inhale 1 puff into the lungs daily. 11/16/16   Juline Patch, MD  gabapentin (NEURONTIN) 100 MG capsule TAKE 1 CAPSULE(100 MG) BY MOUTH EVERY NIGHT AT BEDTIME AS NEEDED 04/15/17   Juline Patch, MD  gentamicin (GARAMYCIN) 0.3 % ophthalmic solution Place 1 drop into both eyes every 4 (four) hours. 05/09/17   Sable Feil, PA-C  glipiZIDE (GLUCOTROL XL) 10 MG 24 hr tablet Take 1 tablet (10 mg total) by mouth 2 (two) times daily. 04/11/16   Otilio Miu  C, MD  lisinopril (PRINIVIL,ZESTRIL) 10 MG tablet TAKE 1 TABLET(10 MG) BY MOUTH DAILY 02/25/17   Juline Patch, MD  Melatonin 10 MG TABS Take 10 mg by mouth daily.    [provider]  metFORMIN (GLUCOPHAGE) 1000 MG tablet Take 1 tablet (1,000 mg total) by mouth 2 (two) times daily with a meal. 04/11/16   Juline Patch, MD  naphazoline-pheniramine (NAPHCON-A) 0.025-0.3 % ophthalmic solution Place 1 drop into both eyes 4 (four) times daily as needed for eye irritation. 05/09/17   Sable Feil, PA-C  pantoprazole (PROTONIX) 40  MG tablet Take 1 tablet (40 mg total) by mouth daily. 04/11/16   Juline Patch, MD  solifenacin (VESICARE) 10 MG tablet Take 1 tablet (10 mg total) by mouth daily. 04/02/17   Juline Patch, MD  sucralfate (CARAFATE) 1 G tablet Take 1 g by mouth as needed.     [provider]  Vitamin D, Ergocalciferol, (DRISDOL) 50000 units CAPS capsule Take 1 capsule (50,000 Units total) by mouth every 30 (thirty) days. 04/11/16   Juline Patch, MD    Allergies Aspirin; Sulfa antibiotics; and Oxybutynin  Family History  Problem Relation Age of Onset  . Arthritis Mother   . Cancer Mother        colon  . Arthritis Father   . Cancer Father        colon  . Cancer Sister        lung  . Cancer Other        lung    Social History Social History  Substance Use Topics  . Smoking status: Never Smoker  . Smokeless tobacco: Never Used  . Alcohol use No    Review of Systems  Constitutional: No fever/chills Eyes: Decreased vision secondary to bilateral eye pain ENT: No sore throat. Cardiovascular: Denies chest pain. Respiratory: Denies shortness of breath. Gastrointestinal: No abdominal pain.  No nausea, no vomiting.  No diarrhea.  No constipation. Genitourinary: Negative for dysuria. Musculoskeletal: Negative for back pain. Skin: Negative for rash. Neurological: Negative for headaches, focal weakness or numbness. Endocrine:Diabetes and hypertension Hematological/Lymphatic:Pernicious anemia Allergic/Immunilogical: See medication list  ____________________________________________   PHYSICAL EXAM:  VITAL SIGNS: ED Triage Vitals  Enc Vitals Group     BP 05/09/17 2011 (!) 183/90     Pulse Rate 05/09/17 2011 83     Resp 05/09/17 2011 16     Temp 05/09/17 2011 98.1 F (36.7 C)     Temp Source 05/09/17 2011 Oral     SpO2 05/09/17 2011 99 %     Weight 05/09/17 2011 211 lb (95.7 kg)     Height --      Head Circumference --      Peak Flow --      Pain Score 05/09/17 2022 8      Pain Loc --      Pain Edu? --      Excl. in [place]? --     Constitutional: Alert and oriented. Moderate distress  Eyes: Photophobic. Fluro-stain of the eyes revealed bilateral corneal abrasions Cardiovascular: Normal rate, regular rhythm. Grossly normal heart sounds.  Good peripheral circulation. Elevated blood pressure Respiratory: Normal respiratory effort.  No retractions. Lungs CTAB. Neurologic:  Normal speech and language. No gross focal neurologic deficits are appreciated. No gait instability. Skin:  Skin is warm, dry and intact. No rash noted. Psychiatric: Mood and affect are normal. Speech and behavior are normal.  ____________________________________________   LABS (all labs ordered  are listed, but only abnormal results are displayed)  Labs Reviewed - No data to display ____________________________________________  EKG   ____________________________________________  RADIOLOGY  No results found.  ____________________________________________   PROCEDURES  Procedure(s) performed: None  Procedures  Critical Care performed: No  ____________________________________________   INITIAL IMPRESSION / ASSESSMENT AND PLAN / ED COURSE  Pertinent labs & imaging results that were available during my care of the patient were reviewed by me and considered in my medical decision making (see chart for details).  Bilateral eye pain secondary to corneal abrasions. Patient given discharge care instructions. Patient given prescription for gentamicin Naphcon-A. Patient advised to follow-up with ophthalmology if no improvement within 2-3 days. Return to ED if condition worsens.      ____________________________________________   FINAL CLINICAL IMPRESSION(S) / ED DIAGNOSES  Final diagnoses:  Bilateral corneal abrasions, initial encounter      NEW MEDICATIONS STARTED DURING THIS VISIT:  New Prescriptions   GENTAMICIN (GARAMYCIN) 0.3 % OPHTHALMIC SOLUTION    Place 1 drop  into both eyes every 4 (four) hours.   NAPHAZOLINE-PHENIRAMINE (NAPHCON-A) 0.025-0.3 % OPHTHALMIC SOLUTION    Place 1 drop into both eyes 4 (four) times daily as needed for eye irritation.     Note:  This document was prepared using Dragon voice recognition software and may include unintentional dictation errors.    Sable Feil, PA-C 05/09/17 2059    Rudene Re, MD 05/10/17 [phone]

## 2017-05-09 NOTE — ED Notes (Signed)
Pt states that she went to take a nap today and when she woke up she couldn't really see. States she put lotion on her face this morning and she is wondering if the lotion went in her eyes. Also states that she got in the pool today but eyes were fine at that point. Family at the bedside. States that it feels like sand is in them.

## 2017-05-09 NOTE — ED Triage Notes (Signed)
Pt reports since 1400 today that she has had bilateral eye pain, feeling of sandpaper. Pt sts she put lotion on this AM and was at a pool this evening before eye pain. Pt denies injury.

## 2017-05-14 ENCOUNTER — Telehealth: Payer: Self-pay

## 2017-05-14 ENCOUNTER — Other Ambulatory Visit: Payer: Self-pay

## 2017-05-14 DIAGNOSIS — S0500XD Injury of conjunctiva and corneal abrasion without foreign body, unspecified eye, subsequent encounter: Secondary | ICD-10-CM

## 2017-05-14 DIAGNOSIS — H168 Other keratitis: Secondary | ICD-10-CM | POA: Diagnosis not present

## 2017-05-14 NOTE — Telephone Encounter (Signed)
t called and said she was seen in ED for corneal abrasion/ loss of vision- needed to follow up in 2-3 days with opth. Normally sees Dr Wallace Going- will get in today

## 2017-05-16 ENCOUNTER — Ambulatory Visit
Admission: RE | Admit: 2017-05-16 | Discharge: 2017-05-16 | Disposition: A | Discharge status: Home / Self Care | Payer: Medicare Other | Source: Ambulatory Visit | Attending: Family Medicine | Admitting: Family Medicine

## 2017-05-16 ENCOUNTER — Other Ambulatory Visit: Payer: Self-pay | Admitting: Family Medicine

## 2017-05-16 ENCOUNTER — Ambulatory Visit (INDEPENDENT_AMBULATORY_CARE_PROVIDER_SITE_OTHER): Payer: Medicare Other

## 2017-05-16 DIAGNOSIS — D519 Vitamin B12 deficiency anemia, unspecified: Secondary | ICD-10-CM

## 2017-05-16 DIAGNOSIS — K449 Diaphragmatic hernia without obstruction or gangrene: Secondary | ICD-10-CM | POA: Insufficient documentation

## 2017-05-16 DIAGNOSIS — J984 Other disorders of lung: Secondary | ICD-10-CM | POA: Diagnosis not present

## 2017-05-16 DIAGNOSIS — R938 Abnormal findings on diagnostic imaging of other specified body structures: Secondary | ICD-10-CM | POA: Diagnosis not present

## 2017-05-16 DIAGNOSIS — R9389 Abnormal findings on diagnostic imaging of other specified body structures: Secondary | ICD-10-CM

## 2017-05-16 DIAGNOSIS — J449 Chronic obstructive pulmonary disease, unspecified: Secondary | ICD-10-CM

## 2017-05-16 MED ORDER — CYANOCOBALAMIN 1000 MCG/ML IJ SOLN
1000.0000 ug | Freq: Once | INTRAMUSCULAR | Status: AC
  Administered 2017-05-16: 1000 ug via INTRAMUSCULAR

## 2017-06-12 DIAGNOSIS — E119 Type 2 diabetes mellitus without complications: Secondary | ICD-10-CM | POA: Diagnosis not present

## 2017-06-24 ENCOUNTER — Ambulatory Visit (INDEPENDENT_AMBULATORY_CARE_PROVIDER_SITE_OTHER): Payer: Medicare Other

## 2017-06-24 DIAGNOSIS — Z23 Encounter for immunization: Secondary | ICD-10-CM

## 2017-06-24 DIAGNOSIS — E538 Deficiency of other specified B group vitamins: Secondary | ICD-10-CM

## 2017-06-24 MED ORDER — CYANOCOBALAMIN 1000 MCG/ML IJ SOLN
1000.0000 ug | Freq: Once | INTRAMUSCULAR | Status: AC
Start: 2017-06-24 — End: 2017-06-24
  Administered 2017-06-24: 1000 ug via INTRAMUSCULAR

## 2017-07-09 ENCOUNTER — Other Ambulatory Visit: Payer: Self-pay | Admitting: Family Medicine

## 2017-07-09 DIAGNOSIS — I10 Essential (primary) hypertension: Secondary | ICD-10-CM

## 2017-07-16 DIAGNOSIS — E538 Deficiency of other specified B group vitamins: Secondary | ICD-10-CM | POA: Diagnosis not present

## 2017-07-16 DIAGNOSIS — E11649 Type 2 diabetes mellitus with hypoglycemia without coma: Secondary | ICD-10-CM | POA: Diagnosis not present

## 2017-07-16 DIAGNOSIS — R809 Proteinuria, unspecified: Secondary | ICD-10-CM | POA: Diagnosis not present

## 2017-07-16 DIAGNOSIS — R0609 Other forms of dyspnea: Secondary | ICD-10-CM | POA: Diagnosis not present

## 2017-07-16 DIAGNOSIS — E1129 Type 2 diabetes mellitus with other diabetic kidney complication: Secondary | ICD-10-CM | POA: Diagnosis not present

## 2017-07-16 DIAGNOSIS — M545 Low back pain: Secondary | ICD-10-CM | POA: Diagnosis not present

## 2017-07-16 DIAGNOSIS — G8929 Other chronic pain: Secondary | ICD-10-CM | POA: Diagnosis not present

## 2017-07-16 DIAGNOSIS — E559 Vitamin D deficiency, unspecified: Secondary | ICD-10-CM | POA: Diagnosis not present

## 2017-07-16 DIAGNOSIS — E1142 Type 2 diabetes mellitus with diabetic polyneuropathy: Secondary | ICD-10-CM | POA: Diagnosis not present

## 2017-07-16 DIAGNOSIS — E669 Obesity, unspecified: Secondary | ICD-10-CM | POA: Diagnosis not present

## 2017-07-16 DIAGNOSIS — Z72820 Sleep deprivation: Secondary | ICD-10-CM | POA: Diagnosis not present

## 2017-07-23 DIAGNOSIS — I10 Essential (primary) hypertension: Secondary | ICD-10-CM | POA: Diagnosis not present

## 2017-07-23 DIAGNOSIS — R0602 Shortness of breath: Secondary | ICD-10-CM | POA: Diagnosis not present

## 2017-07-23 DIAGNOSIS — E669 Obesity, unspecified: Secondary | ICD-10-CM | POA: Diagnosis not present

## 2017-07-23 DIAGNOSIS — R0981 Nasal congestion: Secondary | ICD-10-CM | POA: Diagnosis not present

## 2017-07-23 DIAGNOSIS — K219 Gastro-esophageal reflux disease without esophagitis: Secondary | ICD-10-CM | POA: Diagnosis not present

## 2017-07-23 DIAGNOSIS — I208 Other forms of angina pectoris: Secondary | ICD-10-CM | POA: Diagnosis not present

## 2017-07-23 DIAGNOSIS — M1990 Unspecified osteoarthritis, unspecified site: Secondary | ICD-10-CM | POA: Diagnosis not present

## 2017-07-23 DIAGNOSIS — E119 Type 2 diabetes mellitus without complications: Secondary | ICD-10-CM | POA: Diagnosis not present

## 2017-08-12 DIAGNOSIS — R0602 Shortness of breath: Secondary | ICD-10-CM | POA: Diagnosis not present

## 2017-08-12 DIAGNOSIS — I208 Other forms of angina pectoris: Secondary | ICD-10-CM | POA: Diagnosis not present

## 2017-08-28 DIAGNOSIS — M1990 Unspecified osteoarthritis, unspecified site: Secondary | ICD-10-CM | POA: Diagnosis not present

## 2017-08-28 DIAGNOSIS — E119 Type 2 diabetes mellitus without complications: Secondary | ICD-10-CM | POA: Diagnosis not present

## 2017-08-28 DIAGNOSIS — I208 Other forms of angina pectoris: Secondary | ICD-10-CM | POA: Diagnosis not present

## 2017-08-28 DIAGNOSIS — I10 Essential (primary) hypertension: Secondary | ICD-10-CM | POA: Diagnosis not present

## 2017-08-28 DIAGNOSIS — R0981 Nasal congestion: Secondary | ICD-10-CM | POA: Diagnosis not present

## 2017-08-28 DIAGNOSIS — E669 Obesity, unspecified: Secondary | ICD-10-CM | POA: Diagnosis not present

## 2017-08-28 DIAGNOSIS — R0602 Shortness of breath: Secondary | ICD-10-CM | POA: Diagnosis not present

## 2017-08-28 DIAGNOSIS — K219 Gastro-esophageal reflux disease without esophagitis: Secondary | ICD-10-CM | POA: Diagnosis not present

## 2017-09-11 ENCOUNTER — Other Ambulatory Visit: Payer: Self-pay | Admitting: Physical Medicine and Rehabilitation

## 2017-09-11 DIAGNOSIS — M5416 Radiculopathy, lumbar region: Secondary | ICD-10-CM | POA: Diagnosis not present

## 2017-09-11 DIAGNOSIS — M48062 Spinal stenosis, lumbar region with neurogenic claudication: Secondary | ICD-10-CM | POA: Diagnosis not present

## 2017-09-11 DIAGNOSIS — M5136 Other intervertebral disc degeneration, lumbar region: Secondary | ICD-10-CM | POA: Diagnosis not present

## 2017-09-16 ENCOUNTER — Other Ambulatory Visit: Payer: Self-pay

## 2017-09-16 ENCOUNTER — Ambulatory Visit (INDEPENDENT_AMBULATORY_CARE_PROVIDER_SITE_OTHER): Payer: Medicare Other

## 2017-09-16 DIAGNOSIS — E538 Deficiency of other specified B group vitamins: Secondary | ICD-10-CM

## 2017-09-16 MED ORDER — CYANOCOBALAMIN 1000 MCG/ML IJ SOLN
1000.0000 ug | Freq: Once | INTRAMUSCULAR | Status: AC
  Administered 2017-09-16: 1000 ug via INTRAMUSCULAR

## 2017-09-17 ENCOUNTER — Ambulatory Visit
Admission: RE | Admit: 2017-09-17 | Discharge: 2017-09-17 | Disposition: A | Discharge status: Home / Self Care | Payer: Medicare Other | Source: Ambulatory Visit | Attending: Physical Medicine and Rehabilitation | Admitting: Physical Medicine and Rehabilitation

## 2017-09-17 DIAGNOSIS — M48061 Spinal stenosis, lumbar region without neurogenic claudication: Secondary | ICD-10-CM | POA: Diagnosis not present

## 2017-09-17 DIAGNOSIS — M5416 Radiculopathy, lumbar region: Secondary | ICD-10-CM | POA: Diagnosis not present

## 2017-09-17 DIAGNOSIS — M545 Low back pain: Secondary | ICD-10-CM | POA: Diagnosis not present

## 2017-09-18 DIAGNOSIS — Z8719 Personal history of other diseases of the digestive system: Secondary | ICD-10-CM | POA: Diagnosis not present

## 2017-09-18 DIAGNOSIS — Z862 Personal history of diseases of the blood and blood-forming organs and certain disorders involving the immune mechanism: Secondary | ICD-10-CM | POA: Diagnosis not present

## 2017-09-18 DIAGNOSIS — R0602 Shortness of breath: Secondary | ICD-10-CM | POA: Diagnosis not present

## 2017-09-18 DIAGNOSIS — R195 Other fecal abnormalities: Secondary | ICD-10-CM | POA: Diagnosis not present

## 2017-09-18 DIAGNOSIS — R5383 Other fatigue: Secondary | ICD-10-CM | POA: Diagnosis not present

## 2017-09-23 ENCOUNTER — Other Ambulatory Visit: Payer: Self-pay | Admitting: Family Medicine

## 2017-09-23 DIAGNOSIS — I10 Essential (primary) hypertension: Secondary | ICD-10-CM

## 2017-10-18 DIAGNOSIS — E538 Deficiency of other specified B group vitamins: Secondary | ICD-10-CM | POA: Diagnosis not present

## 2017-10-18 DIAGNOSIS — E1142 Type 2 diabetes mellitus with diabetic polyneuropathy: Secondary | ICD-10-CM | POA: Diagnosis not present

## 2017-10-18 DIAGNOSIS — E1129 Type 2 diabetes mellitus with other diabetic kidney complication: Secondary | ICD-10-CM | POA: Diagnosis not present

## 2017-10-18 DIAGNOSIS — E559 Vitamin D deficiency, unspecified: Secondary | ICD-10-CM | POA: Diagnosis not present

## 2017-10-18 DIAGNOSIS — R809 Proteinuria, unspecified: Secondary | ICD-10-CM | POA: Diagnosis not present

## 2017-10-21 DIAGNOSIS — Z01812 Encounter for preprocedural laboratory examination: Secondary | ICD-10-CM | POA: Diagnosis not present

## 2017-10-21 DIAGNOSIS — R195 Other fecal abnormalities: Secondary | ICD-10-CM | POA: Diagnosis not present

## 2017-10-21 DIAGNOSIS — R1013 Epigastric pain: Secondary | ICD-10-CM | POA: Diagnosis not present

## 2017-10-21 DIAGNOSIS — K862 Cyst of pancreas: Secondary | ICD-10-CM | POA: Diagnosis not present

## 2017-10-21 DIAGNOSIS — D509 Iron deficiency anemia, unspecified: Secondary | ICD-10-CM | POA: Diagnosis not present

## 2017-10-22 ENCOUNTER — Other Ambulatory Visit: Payer: Self-pay | Admitting: Student

## 2017-10-22 DIAGNOSIS — K862 Cyst of pancreas: Secondary | ICD-10-CM

## 2017-10-24 ENCOUNTER — Other Ambulatory Visit: Payer: Self-pay | Admitting: Family Medicine

## 2017-10-24 DIAGNOSIS — I10 Essential (primary) hypertension: Secondary | ICD-10-CM

## 2017-10-24 DIAGNOSIS — K862 Cyst of pancreas: Secondary | ICD-10-CM | POA: Diagnosis not present

## 2017-11-05 ENCOUNTER — Ambulatory Visit
Admission: RE | Admit: 2017-11-05 | Discharge: 2017-11-05 | Disposition: A | Discharge status: Home / Self Care | Payer: Medicare Other | Source: Ambulatory Visit | Attending: Student | Admitting: Student

## 2017-11-05 DIAGNOSIS — D3502 Benign neoplasm of left adrenal gland: Secondary | ICD-10-CM | POA: Diagnosis not present

## 2017-11-05 DIAGNOSIS — K449 Diaphragmatic hernia without obstruction or gangrene: Secondary | ICD-10-CM | POA: Insufficient documentation

## 2017-11-05 DIAGNOSIS — K862 Cyst of pancreas: Secondary | ICD-10-CM | POA: Insufficient documentation

## 2017-11-05 MED ORDER — GADOBENATE DIMEGLUMINE 529 MG/ML IV SOLN
20.0000 mL | Freq: Once | INTRAVENOUS | Status: AC | PRN
  Administered 2017-11-05: 20 mL via INTRAVENOUS

## 2017-11-07 ENCOUNTER — Ambulatory Visit (INDEPENDENT_AMBULATORY_CARE_PROVIDER_SITE_OTHER): Payer: Medicare Other | Admitting: Family Medicine

## 2017-11-07 ENCOUNTER — Encounter: Payer: Self-pay | Admitting: Family Medicine

## 2017-11-07 VITALS — BP 120/80 | HR 68 | Ht 63.0 in | Wt 199.0 lb

## 2017-11-07 DIAGNOSIS — D518 Other vitamin B12 deficiency anemias: Secondary | ICD-10-CM

## 2017-11-07 DIAGNOSIS — N3090 Cystitis, unspecified without hematuria: Secondary | ICD-10-CM | POA: Diagnosis not present

## 2017-11-07 LAB — POCT URINALYSIS DIPSTICK
BILIRUBIN UA: NEGATIVE
Glucose, UA: NEGATIVE
Ketones, UA: NEGATIVE
NITRITE UA: POSITIVE
PH UA: 6 (ref 5.0–8.0)
Spec Grav, UA: 1.02 (ref 1.010–1.025)
UROBILINOGEN UA: 0.2 U/dL

## 2017-11-07 MED ORDER — CYANOCOBALAMIN 1000 MCG/ML IJ SOLN
1000.0000 ug | Freq: Once | INTRAMUSCULAR | Status: AC
Start: 2017-11-07 — End: 2017-11-07
  Administered 2017-11-07: 1000 ug via INTRAMUSCULAR

## 2017-11-07 MED ORDER — NITROFURANTOIN MONOHYD MACRO 100 MG PO CAPS: 100.0000 mg | ORAL_CAPSULE | Freq: Two times a day (BID) | ORAL | 0 refills | Status: DC

## 2017-11-07 NOTE — Progress Notes (Signed)
Name: Brenda [name]   MRN: [id number]    DOB: 24-Jan-1931   Date:11/07/2017       Progress Note  Subjective  Chief Complaint  Chief Complaint  Patient presents with  . Urinary Tract Infection    frequent urination with burning  . b12 def    needs b12 inj    Urinary Tract Infection   This is a new problem. The current episode started in the past 7 days. The problem occurs intermittently. The problem has been gradually worsening. The quality of the pain is described as burning. The pain is at a severity of 3/10. The pain is mild. There has been no fever. She is not sexually active. There is no history of pyelonephritis. Pertinent negatives include no chills, discharge, flank pain, frequency, hematuria, hesitancy, nausea, sweats, urgency or vomiting. She has tried nothing for the symptoms.    No problem-specific Assessment & Plan notes found for this encounter.   Past Medical History:  Diagnosis Date  . Arthritis   . Blood in stool   . Cancer ([place])   . Chicken pox   . Diabetes mellitus 2000  . GERD (gastroesophageal reflux disease)   . Hiatal hernia   . Hypertension   . Kidney infection 2014  . Ovarian cancer ([place]) 1991   s/p hysterectomy at [facility]  . Pernicious anemia   . Spinal stenosis   . Ulcer   . Urinary incontinence     Past Surgical History:  Procedure Laterality Date  . ABDOMINAL HYSTERECTOMY    . FOOT SURGERY  2000  . SHOULDER SURGERY  2013   fracture right shoulder    Family History  Problem Relation Age of Onset  . Arthritis Mother   . Cancer Mother        colon  . Arthritis Father   . Cancer Father        colon  . Cancer Sister        lung  . Cancer Other        lung    Social History   Socioeconomic History  . Marital status: Widowed    Spouse name: Not on file  . Number of children: Not on file  . Years of education: Not on file  . Highest education level: Not on file  Social Needs  . Financial resource strain: Not on file  . Food  insecurity - worry: Not on file  . Food insecurity - inability: Not on file  . Transportation needs - medical: Not on file  . Transportation needs - non-medical: Not on file  Occupational History  . Not on file  Tobacco Use  . Smoking status: Never Smoker  . Smokeless tobacco: Never Used  Substance and Sexual Activity  . Alcohol use: No  . Drug use: No  . Sexual activity: No  Other Topics Concern  . Not on file  Social History Narrative   Lives alone in [place]. 1 daughter living in [employer] [place]. Son died lung cancer.    Allergies  Allergen Reactions  . Aspirin Other (See Comments)    bleeding  . Sulfa Antibiotics Swelling  . Oxybutynin Other (See Comments)    Dry mouth    Outpatient Medications Prior to Visit  Medication Sig Dispense Refill  . cholecalciferol (VITAMIN D) 1000 units tablet Take 2,000 Units by mouth daily.    . cyanocobalamin (,VITAMIN B-12,) 1000 MCG/ML injection Inject 1,000 mcg into the muscle every 30 (thirty) days.    . ferrous  sulfate 324 (65 Fe) MG TBEC Take 1 tablet (325 mg total) by mouth daily. 90 tablet 1  . fluticasone furoate-vilanterol (BREO ELLIPTA) 100-25 MCG/INH AEPB Inhale 1 puff into the lungs daily. 60 each 1  . gabapentin (NEURONTIN) 100 MG capsule TAKE 1 CAPSULE(100 MG) BY MOUTH EVERY NIGHT AT BEDTIME AS NEEDED 90 capsule 0  . gentamicin (GARAMYCIN) 0.3 % ophthalmic solution Place 1 drop into both eyes every 4 (four) hours. 5 mL 0  . glipiZIDE (GLUCOTROL XL) 10 MG 24 hr tablet Take 1 tablet (10 mg total) by mouth 2 (two) times daily. 180 tablet 1  . lisinopril (PRINIVIL,ZESTRIL) 10 MG tablet TAKE 1 TABLET(10 MG) BY MOUTH DAILY 30 tablet 1  . Melatonin 10 MG TABS Take 10 mg by mouth daily.    . metFORMIN (GLUCOPHAGE) 1000 MG tablet Take 1 tablet (1,000 mg total) by mouth 2 (two) times daily with a meal. 180 tablet 1  . naphazoline-pheniramine (NAPHCON-A) 0.025-0.3 % ophthalmic solution Place 1 drop into both eyes 4 (four) times daily as needed for  eye irritation. 15 mL 0  . pantoprazole (PROTONIX) 40 MG tablet Take 1 tablet (40 mg total) by mouth daily. 90 tablet 1  . PROAIR HFA 108 (90 Base) MCG/ACT inhaler INHALE 2 PUFFS INTO THE LUNGS EVERY 6 HOURS AS NEEDED FOR WHEEZING OR SHORTNESS OF BREATH 8.5 g 1  . solifenacin (VESICARE) 10 MG tablet Take 1 tablet (10 mg total) by mouth daily. 30 tablet 5  . sucralfate (CARAFATE) 1 G tablet Take 1 g by mouth as needed.     . Vitamin D, Ergocalciferol, (DRISDOL) 50000 units CAPS capsule Take 1 capsule (50,000 Units total) by mouth every 30 (thirty) days. 3 capsule 1   Facility-Administered Medications Prior to Visit  Medication Dose Route Frequency Provider Last Rate Last Dose  . ipratropium-albuterol (DUONEB) 0.5-2.5 (3) MG/3ML nebulizer solution 3 mL  3 mL Nebulization Once Juline Patch, MD        Review of Systems  Constitutional: Negative for chills, fever, malaise/fatigue and weight loss.  HENT: Negative for ear discharge, ear pain and sore throat.   Eyes: Negative for blurred vision.  Respiratory: Negative for cough, sputum production, shortness of breath and wheezing.   Cardiovascular: Negative for chest pain, palpitations and leg swelling.  Gastrointestinal: Negative for abdominal pain, blood in stool, constipation, diarrhea, heartburn, melena, nausea and vomiting.  Genitourinary: Negative for dysuria, flank pain, frequency, hematuria, hesitancy and urgency.  Musculoskeletal: Negative for back pain, joint pain, myalgias and neck pain.  Skin: Negative for rash.  Neurological: Negative for dizziness, tingling, sensory change, focal weakness and headaches.  Endo/Heme/Allergies: Negative for environmental allergies and polydipsia. Does not bruise/bleed easily.  Psychiatric/Behavioral: Negative for depression and suicidal ideas. The patient is not nervous/anxious and does not have insomnia.      Objective  Vitals:   11/07/17 1334  BP: 120/80  Pulse: 68  Weight: 199 lb (90.3 kg)   Height: 5\' 3"  (1.6 m)    Physical Exam  Constitutional: She is well-developed, well-nourished, and in no distress. No distress.  HENT:  Head: Normocephalic and atraumatic.  Right Ear: External ear normal.  Left Ear: External ear normal.  Nose: Nose normal.  Mouth/Throat: Oropharynx is clear and moist.  Eyes: Conjunctivae and EOM are normal. Pupils are equal, round, and reactive to light. Right eye exhibits no discharge. Left eye exhibits no discharge.  Neck: Normal range of motion. Neck supple. No JVD present. No thyromegaly present.  Cardiovascular: Normal rate, regular rhythm, normal heart sounds and intact distal pulses. Exam reveals no gallop and no friction rub.  No murmur heard. Pulmonary/Chest: Effort normal and breath sounds normal. She has no wheezes. She has no rales.  Abdominal: Soft. Bowel sounds are normal. She exhibits no mass. There is no tenderness. There is no guarding.  Musculoskeletal: Normal range of motion. She exhibits no edema.  Lymphadenopathy:    She has no cervical adenopathy.  Neurological: She is alert. She has normal reflexes.  Skin: Skin is warm and dry. She is not diaphoretic.  Psychiatric: Mood and affect normal.  Nursing note and vitals reviewed.     Assessment & Plan  Problem List Items Addressed This Visit      Other   Anemia   Relevant Medications   cyanocobalamin ((VITAMIN B-12)) injection 1,000 mcg (Completed)    Other Visit Diagnoses    Cystitis    -  Primary   Relevant Medications   nitrofurantoin, macrocrystal-monohydrate, (MACROBID) 100 MG capsule   Other Relevant Orders   POCT urinalysis dipstick (Completed)      Meds ordered this encounter  Medications  . nitrofurantoin, macrocrystal-monohydrate, (MACROBID) 100 MG capsule    Sig: Take 1 capsule (100 mg total) by mouth 2 (two) times daily.    Dispense:  6 capsule    Refill:  0  . cyanocobalamin ((VITAMIN B-12)) injection 1,000 mcg      Dr. Otilio Miu [facility]  11/07/17

## 2017-11-15 DIAGNOSIS — D649 Anemia, unspecified: Secondary | ICD-10-CM | POA: Diagnosis not present

## 2017-11-15 DIAGNOSIS — M5416 Radiculopathy, lumbar region: Secondary | ICD-10-CM | POA: Diagnosis not present

## 2017-11-15 DIAGNOSIS — D509 Iron deficiency anemia, unspecified: Secondary | ICD-10-CM | POA: Diagnosis not present

## 2017-11-15 DIAGNOSIS — M5136 Other intervertebral disc degeneration, lumbar region: Secondary | ICD-10-CM | POA: Diagnosis not present

## 2017-11-15 DIAGNOSIS — M48062 Spinal stenosis, lumbar region with neurogenic claudication: Secondary | ICD-10-CM | POA: Diagnosis not present

## 2017-12-23 ENCOUNTER — Ambulatory Visit (INDEPENDENT_AMBULATORY_CARE_PROVIDER_SITE_OTHER): Payer: Medicare Other

## 2017-12-23 DIAGNOSIS — D518 Other vitamin B12 deficiency anemias: Secondary | ICD-10-CM | POA: Diagnosis not present

## 2017-12-23 MED ORDER — CYANOCOBALAMIN 1000 MCG/ML IJ SOLN
1000.0000 ug | Freq: Once | INTRAMUSCULAR | Status: AC
  Administered 2017-12-23: 1000 ug via INTRAMUSCULAR

## 2018-02-19 ENCOUNTER — Ambulatory Visit (INDEPENDENT_AMBULATORY_CARE_PROVIDER_SITE_OTHER): Payer: Medicare Other

## 2018-02-19 VITALS — BP 100/60 | HR 66 | Temp 98.0°F | Ht 63.0 in | Wt 194.6 lb

## 2018-02-19 DIAGNOSIS — Z0000 Encounter for general adult medical examination without abnormal findings: Secondary | ICD-10-CM

## 2018-02-19 DIAGNOSIS — Z9181 History of falling: Secondary | ICD-10-CM

## 2018-02-19 NOTE — Progress Notes (Signed)
Subjective:   Brenda [name] is a 84 y.o. female who presents for Medicare Annual (Subsequent) preventive examination.  Review of Systems:  N/A Cardiac Risk Factors include: advanced age (>55men, >19 women);diabetes mellitus;hypertension;obesity (BMI >30kg/m2);sedentary lifestyle     Objective:     Vitals: BP 100/60 (BP Location: Right Arm, Patient Position: Sitting, Cuff Size: Normal)   Pulse 66   Temp 98 F (36.7 C) (Oral)   Ht 5\' 3"  (1.6 m)   Wt 194 lb 9.6 oz (88.3 kg)   SpO2 93%   BMI 34.47 kg/m   Body mass index is 34.47 kg/m.  Advanced Directives 02/19/2018 02/18/2017 11/30/2016 05/26/2015  Does Patient Have a Medical Advance Directive? No No No Yes  Type of Advance Directive - - - Living will  Copy of [place] in Chart? - - - No - copy requested  Would patient like information on creating a medical advance directive? Yes (MAU/Ambulatory/Procedural Areas - Information given) Yes (MAU/Ambulatory/Procedural Areas - Information given) - -    Tobacco Social History   Tobacco Use  Smoking Status Never Smoker  Smokeless Tobacco Never Used  Tobacco Comment   smoking cessation materials not required     Counseling given: No Comment: smoking cessation materials not required  Clinical Intake:  Pre-visit preparation completed: Yes  Pain : No/denies pain   BMI - recorded: 34.47 Nutritional Status: BMI > 30  Obese Nutritional Risks: None  Nutrition Risk Assessment: Has the patient had any N/V/D within the last 2 months?  No Does the patient have any non-healing wounds?  No Has the patient had any unintentional weight loss or weight gain?  No  Is the patient diabetic?  Yes If diabetic, was a CBG obtained today?  No Did the patient bring in their glucometer from home?  No Comments: Pt monitors CBG's daily. Denies any financial strains with the device or supplies.  Diabetic Exams: Diabetic Eye Exam: Completed 05/06/17 Diabetic Foot Exam: Completed  02/16/15. Overdue for diabetic foot exam. Pt has been advised about the importance in completing this exam. Advised to schedule an appt with Dr. Ronnald Ramp to complete this exam. Verbalized acceptance and understanding.  How often do you need to have someone help you when you read instructions, pamphlets, or other written materials from your doctor or pharmacy?: 1 - Never  Interpreter Needed?: No  Information entered by :: Idell Pickles, LPN  Past Medical History:  Diagnosis Date  . Arthritis   . Blood in stool   . Cancer ([place])   . Chicken pox   . Diabetes mellitus 2000  . GERD (gastroesophageal reflux disease)   . Hiatal hernia   . Hypertension   . Kidney infection 2014  . Ovarian cancer ([place]) 1991   s/p hysterectomy at [facility]  . Pernicious anemia   . Spinal stenosis   . Ulcer   . Urinary incontinence    Past Surgical History:  Procedure Laterality Date  . ABDOMINAL HYSTERECTOMY    . FOOT SURGERY  2000  . SHOULDER SURGERY  2013   fracture right shoulder   Family History  Problem Relation Age of Onset  . Arthritis Mother   . Cancer Mother        colon  . Arthritis Father   . Cancer Father        colon  . Cancer Sister        lung  . Cancer Other        lung   Social  History   Socioeconomic History  . Marital status: Widowed    Spouse name: Not on file  . Number of children: 2  . Years of education: Not on file  . Highest education level: 9th grade  Occupational History  . Occupation: Retired  Scientific laboratory technician  . Financial resource strain: Not hard at all  . Food insecurity:    Worry: Never true    Inability: Never true  . Transportation needs:    Medical: No    Non-medical: No  Tobacco Use  . Smoking status: Never Smoker  . Smokeless tobacco: Never Used  . Tobacco comment: smoking cessation materials not required  Substance and Sexual Activity  . Alcohol use: No  . Drug use: No  . Sexual activity: Never  Lifestyle  . Physical activity:    Days per week: 0 days     Minutes per session: 0 min  . Stress: Not at all  Relationships  . Social connections:    Talks on phone: Patient refused    Gets together: Patient refused    Attends religious service: Patient refused    Active member of club or organization: Patient refused    Attends meetings of clubs or organizations: Patient refused    Relationship status: Widowed  Other Topics Concern  . Not on file  Social History Narrative   Lives alone in [place]. 1 daughter living in [place]. Son died lung cancer.    Outpatient Encounter Medications as of 02/19/2018  Medication Sig  . cholecalciferol (VITAMIN D) 1000 units tablet Take 2,000 Units by mouth daily.  . cyanocobalamin (,VITAMIN B-12,) 1000 MCG/ML injection Inject 1,000 mcg into the muscle every 30 (thirty) days.  . ferrous sulfate 324 (65 Fe) MG TBEC Take 1 tablet (325 mg total) by mouth daily.  Marland Kitchen gabapentin (NEURONTIN) 100 MG capsule TAKE 1 CAPSULE(100 MG) BY MOUTH EVERY NIGHT AT BEDTIME AS NEEDED  . glipiZIDE (GLUCOTROL XL) 10 MG 24 hr tablet Take 1 tablet (10 mg total) by mouth 2 (two) times daily.  Marland Kitchen lisinopril (PRINIVIL,ZESTRIL) 10 MG tablet TAKE 1 TABLET(10 MG) BY MOUTH DAILY  . metFORMIN (GLUCOPHAGE) 1000 MG tablet Take 1 tablet (1,000 mg total) by mouth 2 (two) times daily with a meal.  . naphazoline-pheniramine (NAPHCON-A) 0.025-0.3 % ophthalmic solution Place 1 drop into both eyes 4 (four) times daily as needed for eye irritation.  . pantoprazole (PROTONIX) 40 MG tablet Take 1 tablet (40 mg total) by mouth daily.  . solifenacin (VESICARE) 10 MG tablet Take 1 tablet (10 mg total) by mouth daily.  . sucralfate (CARAFATE) 1 G tablet Take 1 g by mouth as needed.   . Vitamin D, Ergocalciferol, (DRISDOL) 50000 units CAPS capsule Take 1 capsule (50,000 Units total) by mouth every 30 (thirty) days.  . fluticasone furoate-vilanterol (BREO ELLIPTA) 100-25 MCG/INH AEPB Inhale 1 puff into the lungs daily. (Patient not taking: Reported on 02/19/2018)    . gentamicin (GARAMYCIN) 0.3 % ophthalmic solution Place 1 drop into both eyes every 4 (four) hours.  . Melatonin 10 MG TABS Take 10 mg by mouth daily.  . nitrofurantoin, macrocrystal-monohydrate, (MACROBID) 100 MG capsule Take 1 capsule (100 mg total) by mouth 2 (two) times daily.  Marland Kitchen PROAIR HFA 108 (90 Base) MCG/ACT inhaler INHALE 2 PUFFS INTO THE LUNGS EVERY 6 HOURS AS NEEDED FOR WHEEZING OR SHORTNESS OF BREATH (Patient not taking: Reported on 02/19/2018)   Facility-Administered Encounter Medications as of 02/19/2018  Medication  . ipratropium-albuterol (DUONEB) 0.5-2.5 (3) MG/3ML  nebulizer solution 3 mL    Activities of Daily Living In your present state of health, do you have any difficulty performing the following activities: 02/19/2018  Hearing? N  Comment denies hearing aids  Vision? N  Comment wears eyeglasses  Difficulty concentrating or making decisions? Y  Comment short term memory loss  Walking or climbing stairs? Y  Comment dyspnea, joint pain, back pain, ambulates with walker  Dressing or bathing? N  Doing errands, shopping? N  Preparing Food and eating ? N  Comment denies dentures  Using the Toilet? N  In the past six months, have you accidently leaked urine? Y  Comment urgency and stress incontinence  Do you have problems with loss of bowel control? N  Managing your Medications? N  Managing your Finances? N  Housekeeping or managing your Housekeeping? N  Some recent data might be hidden    Patient Care Team: Juline Patch, MD as PCP - General (Family Medicine) Leandrew Koyanagi, MD as Consulting Physician (Ophthalmology)    Assessment:   This is a routine wellness examination for Brenda [name].  Exercise Activities and Dietary recommendations Current Exercise Habits: The patient does not participate in regular exercise at present, Exercise limited by: None identified  Goals    . DIET - INCREASE WATER INTAKE     Recommend to drink at least 6-8 8oz glasses of  water per day.       Fall Risk Fall Risk  02/19/2018 02/18/2017 01/23/2016 11/04/2015 10/14/2015  Falls in the past year? No No No No No  Number falls in past yr: - - - - -  Injury with Fall? - - - - -  Risk Factor Category  - - - - -  Risk for fall due to : Impaired vision;History of fall(s);Impaired balance/gait;Medication side effect - - - -  Risk for fall due to: Comment wears eyeglasses; ambulates with cane; weakness - - - -   FALL RISK PREVENTION PERTAINING TO HOME: Is your home free of loose throw rugs in walkways, pet beds, electrical cords, etc? Yes Is there adequate lighting in your home to reduce risk of falls?  Yes Are there stairs in or around your home WITH handrails? Yes  ASSISTIVE DEVICES UTILIZED TO PREVENT FALLS: Use of a cane, walker or w/c? Yes, ambulates with cane Grab bars in the bathroom? No  Shower chair or a place to sit while bathing? No An elevated toilet seat or a handicapped toilet? Yes  Timed Get Up and Go Performed: Yes. Pt ambulated 10 feet within 32 sec. Gait slow, steady and with the use of an assistive device. No intervention required at this time. Fall risk prevention has been discussed.  Community Resource Referral:  Pt declined my offer to send [employer] Referral to Care Guide for installation of grab bars in the shower. However, C3 referral placed for shower chair or bench.  Depression Screen PHQ 2/9 Scores 02/19/2018 02/18/2017 01/23/2016 11/04/2015  PHQ - 2 Score 0 0 0 0  PHQ- 9 Score 0 - - -     Cognitive Function     6CIT Screen 02/19/2018 02/18/2017  What Year? 0 points 0 points  What month? 0 points 0 points  What time? 0 points 0 points  Count back from 20 0 points 0 points  Months in reverse 0 points 0 points  Repeat phrase 0 points 0 points  Total Score 0 0    Immunization History  Administered Date(s) Administered  . Influenza Split  06/18/2012, 06/18/2015, 06/15/2016  . Influenza, High Dose Seasonal PF 06/24/2017  .  Influenza,inj,Quad PF,6+ Mos 06/25/2013, 07/09/2014  . Pneumococcal Conjugate-13 01/01/2014  . Pneumococcal-Unspecified 08/29/2011    Qualifies for Shingles Vaccine? Yes. Due for Shingrix. Education has been provided regarding the importance of this vaccine. Pt has been advised to call her insurance company to determine her out of pocket expense. Advised she may also receive this vaccine at her local pharmacy or Health Dept. Verbalized acceptance and understanding.  Due for Tdap vaccine. Education has been provided regarding the importance of this vaccine. Pt has been advised she may receive this vaccine at her local pharmacy or Health Dept. Also advised to provide a copy of her vaccination record if she chooses to receive this vaccine at her local pharmacy. Verbalized acceptance and understanding.  Screening Tests Health Maintenance  Topic Date Due  . FOOT EXAM  02/16/2016  . HEMOGLOBIN A1C  05/19/2017  . TETANUS/TDAP  02/20/2019 (Originally 07/23/2017)  . INFLUENZA VACCINE  04/10/2018  . OPHTHALMOLOGY EXAM  05/06/2018  . DEXA SCAN  Completed    Cancer Screenings: Lung: Low Dose CT Chest recommended if Age 30-80 years, 30 pack-year currently smoking OR have quit w/in 15years. Patient does not qualify. Breast Screening: No longer required Bone Density/Dexa: No longer required Colorectal: No longer required  Additional Screenings: Hepatitis C Screening: Does not qualify    Plan:  I have personally reviewed and addressed the Medicare Annual Wellness questionnaire and have noted the following in the patient's chart:  A. Medical and social history B. Use of alcohol, tobacco or illicit drugs  C. Current medications and supplements D. Functional ability and status E.  Nutritional status F.  Physical activity G. Advance directives H. List of other physicians I.  Hospitalizations, surgeries, and ER visits in previous 12 months J.  [place] such as hearing and vision if  needed, cognitive and depression L. Referrals and appointments  In addition, I have reviewed and discussed with patient certain preventive protocols, quality metrics, and best practice recommendations. A written personalized care plan for preventive services as well as general preventive health recommendations were provided to patient.  Signed,  Aleatha Borer, LPN Nurse Health Advisor  MD Recommendations: Due for Shingrix. Education has been provided regarding the importance of this vaccine. Pt has been advised to call her insurance company to determine her out of pocket expense. Advised she may also receive this vaccine at her local pharmacy or Health Dept. Verbalized acceptance and understanding.  Due for Tdap vaccine. Education has been provided regarding the importance of this vaccine. Pt has been advised she may receive this vaccine at her local pharmacy or Health Dept. Also advised to provide a copy of her vaccination record if she chooses to receive this vaccine at her local pharmacy. Verbalized acceptance and understanding.  C3 referral placed for shower chair or bench.  Diabetic Foot Exam: Completed 02/16/15. Overdue for diabetic foot exam. Pt has been advised about the importance in completing this exam. Advised to schedule an appt with Dr. Ronnald Ramp to complete this exam. Verbalized acceptance and understanding.  Requesting B12 injection. Medicare will not cover at the time of a Wellness Visit. Advised to schedule for this injection at the same time she plans to see Dr. Ronnald Ramp for her diabetic foot exam.  Pt states her B/P's have been running low at home. Today's B/P was 100/60. Advised pt to discuss with Dr. Ronnald Ramp to determine if medication needs adjusted or discontinued. Denies  any dizziness, fatigue or dyspnea at present. Dyspnea only noted upon exertion.

## 2018-02-19 NOTE — Patient Instructions (Signed)
Brenda [name] , Thank you for taking time to come for your Medicare Wellness Visit. I appreciate your ongoing commitment to your health goals. Please review the following plan we discussed and let me know if I can assist you in the future.   Screening recommendations/referrals: Colorectal Screening: No longer required Mammogram: No longer required Bone Density: No longer required  Vision and Dental Exams: Recommended annual ophthalmology exams for early detection of glaucoma and other disorders of the eye Recommended annual dental exams for proper oral hygiene  Diabetic Exams: Recommended annual diabetic eye exams for early detection of retinopathy Recommended annual diabetic foot exams for early detection of peripheral neuropathy.  Diabetic Eye Exam: Please schedule an appointment with your ophthalmologist Diabetic Foot Exam: Please schedule an appointment with Dr. Ronnald Ramp  Vaccinations: Influenza vaccine: Up to date Pneumococcal vaccine: Up to date Tdap vaccine: Declined. Please call your insurance company to determine your out of pocket expense. You may also receive this vaccine at your local pharmacy or Health Dept. Shingles vaccine: Please call your insurance company to determine your out of pocket expense for the Shingrix vaccine. You may also receive this vaccine at your local pharmacy or Health Dept.  Advanced directives: Advance directive discussed with you today. I have provided a copy for you to complete at home and have notarized. Once this is complete please bring a copy in to our office so we can scan it into your chart.  Conditions/risks identified: Recommend to drink at least 6-8 8oz glasses of water per day.  Next appointment: Please schedule your Annual Wellness Visit with your Nurse Health Advisor in one year.  Please schedule an appointment with Dr. Ronnald Ramp within the next 30 days for follow up after today's Annual Wellness Visit.  Preventive Care 32 Years and Older,  Female Preventive care refers to lifestyle choices and visits with your health care provider that can promote health and wellness. What does preventive care include?  A yearly physical exam. This is also called an annual well check.  Dental exams once or twice a year.  Routine eye exams. Ask your health care provider how often you should have your eyes checked.  Personal lifestyle choices, including:  Daily care of your teeth and gums.  Regular physical activity.  Eating a healthy diet.  Avoiding tobacco and drug use.  Limiting alcohol use.  Practicing safe sex.  Taking low-dose aspirin every day.  Taking vitamin and mineral supplements as recommended by your health care provider. What happens during an annual well check? The services and screenings done by your health care provider during your annual well check will depend on your age, overall health, lifestyle risk factors, and family history of disease. Counseling  Your health care provider may ask you questions about your:  Alcohol use.  Tobacco use.  Drug use.  Emotional well-being.  Home and relationship well-being.  Sexual activity.  Eating habits.  History of falls.  Memory and ability to understand (cognition).  Work and work Statistician.  Reproductive health. Screening  You may have the following tests or measurements:  Height, weight, and BMI.  Blood pressure.  Lipid and cholesterol levels. These may be checked every 5 years, or more frequently if you are over 67 years old.  Skin check.  Lung cancer screening. You may have this screening every year starting at age 44 if you have a 30-pack-year history of smoking and currently smoke or have quit within the past 15 years.  Fecal occult blood test (  FOBT) of the stool. You may have this test every year starting at age 21.  Flexible sigmoidoscopy or colonoscopy. You may have a sigmoidoscopy every 5 years or a colonoscopy every 10 years  starting at age 48.  Hepatitis C blood test.  Hepatitis B blood test.  Sexually transmitted disease (STD) testing.  Diabetes screening. This is done by checking your blood sugar (glucose) after you have not eaten for a while (fasting). You may have this done every 1-3 years.  Bone density scan. This is done to screen for osteoporosis. You may have this done starting at age 58.  Mammogram. This may be done every 1-2 years. Talk to your health care provider about how often you should have regular mammograms. Talk with your health care provider about your test results, treatment options, and if necessary, the need for more tests. Vaccines  Your health care provider may recommend certain vaccines, such as:  Influenza vaccine. This is recommended every year.  Tetanus, diphtheria, and acellular pertussis (Tdap, Td) vaccine. You may need a Td booster every 10 years.  Zoster vaccine. You may need this after age 73.  Pneumococcal 13-valent conjugate (PCV13) vaccine. One dose is recommended after age 19.  Pneumococcal polysaccharide (PPSV23) vaccine. One dose is recommended after age 65. Talk to your health care provider about which screenings and vaccines you need and how often you need them. This information is not intended to replace advice given to you by your health care provider. Make sure you discuss any questions you have with your health care provider. Document Released: 09/23/2015 Document Revised: 05/16/2016 Document Reviewed: 06/28/2015 Elsevier Interactive Patient Education  2017 [place] Prevention in the Home Falls can cause injuries. They can happen to people of all ages. There are many things you can do to make your home safe and to help prevent falls. What can I do on the outside of my home?  Regularly fix the edges of walkways and driveways and fix any cracks.  Remove anything that might make you trip as you walk through a door, such as a raised step or  threshold.  Trim any bushes or trees on the path to your home.  Use bright outdoor lighting.  Clear any walking paths of anything that might make someone trip, such as rocks or tools.  Regularly check to see if handrails are loose or broken. Make sure that both sides of any steps have handrails.  Any raised decks and porches should have guardrails on the edges.  Have any leaves, snow, or ice cleared regularly.  Use sand or salt on walking paths during winter.  Clean up any spills in your garage right away. This includes oil or grease spills. What can I do in the bathroom?  Use night lights.  Install grab bars by the toilet and in the tub and shower. Do not use towel bars as grab bars.  Use non-skid mats or decals in the tub or shower.  If you need to sit down in the shower, use a plastic, non-slip stool.  Keep the floor dry. Clean up any water that spills on the floor as soon as it happens.  Remove soap buildup in the tub or shower regularly.  Attach bath mats securely with double-sided non-slip rug tape.  Do not have throw rugs and other things on the floor that can make you trip. What can I do in the bedroom?  Use night lights.  Make sure that you have a light  by your bed that is easy to reach.  Do not use any sheets or blankets that are too big for your bed. They should not hang down onto the floor.  Have a firm chair that has side arms. You can use this for support while you get dressed.  Do not have throw rugs and other things on the floor that can make you trip. What can I do in the kitchen?  Clean up any spills right away.  Avoid walking on wet floors.  Keep items that you use a lot in easy-to-reach places.  If you need to reach something above you, use a strong step stool that has a grab bar.  Keep electrical cords out of the way.  Do not use floor polish or wax that makes floors slippery. If you must use wax, use non-skid floor wax.  Do not have  throw rugs and other things on the floor that can make you trip. What can I do with my stairs?  Do not leave any items on the stairs.  Make sure that there are handrails on both sides of the stairs and use them. Fix handrails that are broken or loose. Make sure that handrails are as long as the stairways.  Check any carpeting to make sure that it is firmly attached to the stairs. Fix any carpet that is loose or worn.  Avoid having throw rugs at the top or bottom of the stairs. If you do have throw rugs, attach them to the floor with carpet tape.  Make sure that you have a light switch at the top of the stairs and the bottom of the stairs. If you do not have them, ask someone to add them for you. What else can I do to help prevent falls?  Wear shoes that:  Do not have high heels.  Have rubber bottoms.  Are comfortable and fit you well.  Are closed at the toe. Do not wear sandals.  If you use a stepladder:  Make sure that it is fully opened. Do not climb a closed stepladder.  Make sure that both sides of the stepladder are locked into place.  Ask someone to hold it for you, if possible.  Clearly mark and make sure that you can see:  Any grab bars or handrails.  First and last steps.  Where the edge of each step is.  Use tools that help you move around (mobility aids) if they are needed. These include:  Canes.  Walkers.  Scooters.  Crutches.  Turn on the lights when you go into a dark area. Replace any light bulbs as soon as they burn out.  Set up your furniture so you have a clear path. Avoid moving your furniture around.  If any of your floors are uneven, fix them.  If there are any pets around you, be aware of where they are.  Review your medicines with your doctor. Some medicines can make you feel dizzy. This can increase your chance of falling. Ask your doctor what other things that you can do to help prevent falls. This information is not intended to  replace advice given to you by your health care provider. Make sure you discuss any questions you have with your health care provider. Document Released: 06/23/2009 Document Revised: 02/02/2016 Document Reviewed: 10/01/2014 Elsevier Interactive Patient Education  2017 [employer].

## 2018-02-21 ENCOUNTER — Encounter: Payer: Self-pay | Admitting: Family Medicine

## 2018-02-21 ENCOUNTER — Ambulatory Visit (INDEPENDENT_AMBULATORY_CARE_PROVIDER_SITE_OTHER): Payer: Medicare Other | Admitting: Family Medicine

## 2018-02-21 VITALS — BP 120/70 | HR 68 | Ht 63.0 in | Wt 194.0 lb

## 2018-02-21 DIAGNOSIS — B351 Tinea unguium: Secondary | ICD-10-CM

## 2018-02-21 DIAGNOSIS — D519 Vitamin B12 deficiency anemia, unspecified: Secondary | ICD-10-CM | POA: Diagnosis not present

## 2018-02-21 DIAGNOSIS — I10 Essential (primary) hypertension: Secondary | ICD-10-CM | POA: Diagnosis not present

## 2018-02-21 MED ORDER — CICLOPIROX 8 % EX SOLN: Freq: Every day | CUTANEOUS | 0 refills | Status: DC

## 2018-02-21 MED ORDER — LISINOPRIL 5 MG PO TABS
5.0000 mg | ORAL_TABLET | Freq: Every day | ORAL | 3 refills | Status: DC
Start: 2018-02-21 — End: 2020-01-17

## 2018-02-21 MED ORDER — CYANOCOBALAMIN 1000 MCG/ML IJ SOLN
1000.0000 ug | Freq: Once | INTRAMUSCULAR | Status: AC
  Administered 2018-02-21: 1000 ug via INTRAMUSCULAR

## 2018-02-21 NOTE — Progress Notes (Signed)
Name: Brenda [name]   MRN: [id number]    DOB: [birth year]   Date:02/21/2018       Progress Note  Subjective  Chief Complaint  Chief Complaint  Patient presents with  . Follow-up    saw Brenda [name]- b/p was low   . diabetic foot exam    needed a foot exam- will see endo in August  . b12 def    needs b12 shot    Patient seen and needs recheck for b12 deficiency. She also voices concern for thicken great toenails that are splitting.  Hypertension  This is a chronic problem. The current episode started more than 1 year ago. The problem has been gradually improving since onset. The problem is controlled. Pertinent negatives include no anxiety, blurred vision, chest pain, headaches, malaise/fatigue, neck pain, orthopnea, palpitations, peripheral edema, PND, shortness of breath or sweats. There are no associated agents to hypertension. There are no known risk factors for coronary artery disease. Past treatments include ACE inhibitors. The current treatment provides no improvement. There are no compliance problems.  There is no history of angina, kidney disease, CAD/MI, CVA, heart failure, left ventricular hypertrophy, PVD or retinopathy. There is no history of a hypertension causing med or renovascular disease.  Anemia  Presents for follow-up visit. There has been no abdominal pain, bruising/bleeding easily, confusion, fever, leg swelling, malaise/fatigue, pallor, palpitations or weight loss. Signs of blood loss that are not present include hematochezia, melena and vaginal bleeding. There is no history of heart failure. There are no compliance problems.     Essential hypertension, benign Patient noted with lower blood pressure at AWV. Will decrease lisinopril 5 mg during summer months /patint spends time at pool.   Past Medical History:  Diagnosis Date  . Arthritis   . Blood in stool   . Cancer ([place])   . Chicken pox   . Diabetes mellitus 2000  . GERD (gastroesophageal reflux disease)   . Hiatal  hernia   . Hypertension   . Kidney infection 2014  . Ovarian cancer (Brenda [name]) 1991   s/p [facility] at [facility]  . Pernicious anemia   . Spinal stenosis   . Ulcer   . Urinary incontinence     Past Surgical History:  Procedure Laterality Date  . ABDOMINAL HYSTERECTOMY    . FOOT SURGERY  2000  . SHOULDER SURGERY  2013   fracture right shoulder    Family History  Problem Relation Age of Onset  . Arthritis Mother   . Cancer Mother        colon  . Arthritis Father   . Cancer Father        colon  . Cancer Sister        lung  . Cancer Other        lung    Social History   Socioeconomic History  . Marital status: Widowed    Spouse name: Not on file  . Number of children: 2  . Years of education: Not on file  . Highest education level: 9th grade  Occupational History  . Occupation: Retired  Scientific laboratory technician  . Financial resource strain: Not hard at all  . Food insecurity:    Worry: Never true    Inability: Never true  . Transportation needs:    Medical: No    Non-medical: No  Tobacco Use  . Smoking status: Never Smoker  . Smokeless tobacco: Never Used  . Tobacco comment: smoking cessation materials not required  Substance and Sexual  Activity  . Alcohol use: No  . Drug use: No  . Sexual activity: Never  Lifestyle  . Physical activity:    Days per week: 0 days    Minutes per session: 0 min  . Stress: Not at all  Relationships  . Social connections:    Talks on phone: Patient refused    Gets together: Patient refused    Attends religious service: Patient refused    Active member of club or organization: Patient refused    Attends meetings of clubs or organizations: Patient refused    Relationship status: Widowed  . Intimate partner violence:    Fear of current or ex partner: No    Emotionally abused: No    Physically abused: No    Forced sexual activity: No  Other Topics Concern  . Not on file  Social History Narrative   Lives alone in [place]. 1 daughter  living in [place]. Son died lung cancer.    Allergies  Allergen Reactions  . Aspirin Other (See Comments)    bleeding  . Sulfa Antibiotics Swelling  . Oxybutynin Other (See Comments)    Dry mouth    Outpatient Medications Prior to Visit  Medication Sig Dispense Refill  . cholecalciferol (VITAMIN D) 1000 units tablet Take 2,000 Units by mouth daily.    . cyanocobalamin (,VITAMIN B-12,) 1000 MCG/ML injection Inject 1,000 mcg into the muscle every 30 (thirty) days.    . ferrous sulfate 324 (65 Fe) MG TBEC Take 1 tablet (325 mg total) by mouth daily. 90 tablet 1  . gabapentin (NEURONTIN) 100 MG capsule TAKE 1 CAPSULE(100 MG) BY MOUTH EVERY NIGHT AT BEDTIME AS NEEDED 90 capsule 0  . gentamicin (GARAMYCIN) 0.3 % ophthalmic solution Place 1 drop into both eyes every 4 (four) hours. 5 mL 0  . glipiZIDE (GLUCOTROL XL) 10 MG 24 hr tablet Take 1 tablet (10 mg total) by mouth 2 (two) times daily. 180 tablet 1  . Melatonin 10 MG TABS Take 10 mg by mouth daily.    . metFORMIN (GLUCOPHAGE) 1000 MG tablet Take 1 tablet (1,000 mg total) by mouth 2 (two) times daily with a meal. 180 tablet 1  . naphazoline-pheniramine (NAPHCON-A) 0.025-0.3 % ophthalmic solution Place 1 drop into both eyes 4 (four) times daily as needed for eye irritation. 15 mL 0  . pantoprazole (PROTONIX) 40 MG tablet Take 1 tablet (40 mg total) by mouth daily. 90 tablet 1  . sucralfate (CARAFATE) 1 G tablet Take 1 g by mouth as needed.     . Vitamin D, Ergocalciferol, (DRISDOL) 50000 units CAPS capsule Take 1 capsule (50,000 Units total) by mouth every 30 (thirty) days. 3 capsule 1  . lisinopril (PRINIVIL,ZESTRIL) 10 MG tablet TAKE 1 TABLET(10 MG) BY MOUTH DAILY 30 tablet 1  . fluticasone furoate-vilanterol (BREO ELLIPTA) 100-25 MCG/INH AEPB Inhale 1 puff into the lungs daily. (Patient not taking: Reported on 02/19/2018) 60 each 1  . PROAIR HFA 108 (90 Base) MCG/ACT inhaler INHALE 2 PUFFS INTO THE LUNGS EVERY 6 HOURS AS NEEDED FOR WHEEZING  OR SHORTNESS OF BREATH (Patient not taking: Reported on 02/19/2018) 8.5 g 1  . nitrofurantoin, macrocrystal-monohydrate, (MACROBID) 100 MG capsule Take 1 capsule (100 mg total) by mouth 2 (two) times daily. 6 capsule 0  . solifenacin (VESICARE) 10 MG tablet Take 1 tablet (10 mg total) by mouth daily. 30 tablet 5   Facility-Administered Medications Prior to Visit  Medication Dose Route Frequency Provider Last Rate Last Dose  .  ipratropium-albuterol (DUONEB) 0.5-2.5 (3) MG/3ML nebulizer solution 3 mL  3 mL Nebulization Once Juline Patch, MD        Review of Systems  Constitutional: Negative for chills, fever, malaise/fatigue and weight loss.  HENT: Negative for ear discharge, ear pain and sore throat.   Eyes: Negative for blurred vision.  Respiratory: Negative for cough, sputum production, shortness of breath and wheezing.   Cardiovascular: Negative for chest pain, palpitations, orthopnea, leg swelling and PND.  Gastrointestinal: Negative for abdominal pain, blood in stool, constipation, diarrhea, heartburn, hematochezia, melena and nausea.  Genitourinary: Negative for dysuria, frequency, hematuria, urgency and vaginal bleeding.  Musculoskeletal: Negative for back pain, joint pain, myalgias and neck pain.  Skin: Negative for pallor and rash.  Neurological: Negative for dizziness, tingling, sensory change, focal weakness and headaches.  Endo/Heme/Allergies: Negative for environmental allergies and polydipsia. Does not bruise/bleed easily.  Psychiatric/Behavioral: Negative for confusion, depression and suicidal ideas. The patient is not nervous/anxious and does not have insomnia.      Objective  Vitals:   02/21/18 1116  BP: 120/70  Pulse: 68  Weight: 194 lb (88 kg)  Height: 5\' 3"  (1.6 m)    Physical Exam  Constitutional: No distress.  HENT:  Head: Normocephalic and atraumatic.  Right Ear: Tympanic membrane and external ear normal.  Left Ear: Tympanic membrane and external ear  normal.  Nose: Nose normal.  Mouth/Throat: Oropharynx is clear and moist. Mucous membranes are not pale, dry and not cyanotic. No oropharyngeal exudate, posterior oropharyngeal edema or posterior oropharyngeal erythema.  Eyes: Pupils are equal, round, and reactive to light. Conjunctivae and EOM are normal. Right eye exhibits no discharge. Left eye exhibits no discharge.  Neck: Normal range of motion. Neck supple. No JVD present. No thyromegaly present.  Cardiovascular: Normal rate, regular rhythm, normal heart sounds and intact distal pulses. Exam reveals no gallop and no friction rub.  No murmur heard. Pulmonary/Chest: Effort normal and breath sounds normal.  Abdominal: Soft. Bowel sounds are normal. She exhibits no mass. There is no tenderness. There is no guarding.  Musculoskeletal: Normal range of motion. She exhibits no edema.  Lymphadenopathy:    She has no cervical adenopathy.  Neurological: She is alert. She has normal reflexes.  Skin: Skin is warm and dry. She is not diaphoretic.  Thickened toenails  Nursing note and vitals reviewed.     Assessment & Plan  Problem List Items Addressed This Visit      Cardiovascular and Mediastinum   Essential hypertension, benign    Patient noted with lower blood pressure at AWV. Will decrease lisinopril 5 mg during summer months /patint spends time at pool.      Relevant Medications   lisinopril (PRINIVIL,ZESTRIL) 5 MG tablet     Other   Anemia - Primary   Relevant Medications   cyanocobalamin ((VITAMIN B-12)) injection 1,000 mcg (Completed)   Other Relevant Orders   CBC    Other Visit Diagnoses    Onychomycosis       prescribed Penlac for nails rather than oral medication given age   Relevant Medications   ciclopirox (PENLAC) 8 % solution      Meds ordered this encounter  Medications  . cyanocobalamin ((VITAMIN B-12)) injection 1,000 mcg  . lisinopril (PRINIVIL,ZESTRIL) 5 MG tablet    Sig: Take 1 tablet (5 mg total) by  mouth daily.    Dispense:  90 tablet    Refill:  3  . ciclopirox (PENLAC) 8 % solution  Sig: Apply topically at bedtime. Apply over nail and surrounding skin. Apply daily over previous coat. After seven (7) days, may remove with alcohol and continue cycle.    Dispense:  6.6 mL    Refill:  0      Dr. Otilio Miu [facility]  02/21/18

## 2018-02-21 NOTE — Assessment & Plan Note (Signed)
Patient noted with lower blood pressure at AWV. Will decrease lisinopril 5 mg during summer months /patint spends time at pool.

## 2018-02-22 LAB — CBC
HEMOGLOBIN: 11.4 g/dL (ref 11.1–15.9)
Hematocrit: 34.2 % (ref 34.0–46.6)
MCH: 33.2 pg — ABNORMAL HIGH (ref 26.6–33.0)
MCHC: 33.3 g/dL (ref 31.5–35.7)
MCV: 100 fL — ABNORMAL HIGH (ref 79–97)
PLATELETS: 296 10*3/uL (ref 150–450)
RBC: 3.43 x10E6/uL — AB (ref 3.77–5.28)
RDW: 13.2 % (ref 12.3–15.4)
WBC: 5.5 10*3/uL (ref 3.4–10.8)

## 2018-03-24 ENCOUNTER — Encounter: Payer: Self-pay | Admitting: Family Medicine

## 2018-03-24 ENCOUNTER — Ambulatory Visit (INDEPENDENT_AMBULATORY_CARE_PROVIDER_SITE_OTHER): Payer: Medicare Other | Admitting: Family Medicine

## 2018-03-24 VITALS — BP 110/62 | HR 76 | Ht 63.0 in | Wt 198.0 lb

## 2018-03-24 DIAGNOSIS — N3090 Cystitis, unspecified without hematuria: Secondary | ICD-10-CM | POA: Diagnosis not present

## 2018-03-24 DIAGNOSIS — N3941 Urge incontinence: Secondary | ICD-10-CM

## 2018-03-24 LAB — POCT URINALYSIS DIPSTICK
Bilirubin, UA: NEGATIVE
Blood, UA: NEGATIVE
Glucose, UA: NEGATIVE
KETONES UA: NEGATIVE
Leukocytes, UA: NEGATIVE
NITRITE UA: POSITIVE
PH UA: 5 (ref 5.0–8.0)
PROTEIN UA: NEGATIVE
SPEC GRAV UA: 1.02 (ref 1.010–1.025)
UROBILINOGEN UA: 0.2 U/dL

## 2018-03-24 MED ORDER — SOLIFENACIN SUCCINATE 10 MG PO TABS: 10.0000 mg | ORAL_TABLET | Freq: Every day | ORAL | 1 refills | Status: DC

## 2018-03-24 MED ORDER — NITROFURANTOIN MONOHYD MACRO 100 MG PO CAPS: 100.0000 mg | ORAL_CAPSULE | Freq: Two times a day (BID) | ORAL | 0 refills | Status: DC

## 2018-03-24 NOTE — Progress Notes (Signed)
Name: Brenda [name]   MRN: [id number]    DOB: 11/24/1930   Date:03/24/2018       Progress Note  Subjective  Chief Complaint  Chief Complaint  Patient presents with  . Urinary Tract Infection    urine frequency    Urinary Tract Infection   This is a new problem. The current episode started in the past 7 days. The problem occurs intermittently. The problem has been gradually worsening. The quality of the pain is described as burning. The pain is at a severity of 2/10. The pain is mild. There has been no fever. She is not sexually active. There is no history of pyelonephritis. Associated symptoms include frequency and urgency. Pertinent negatives include no chills, discharge, flank pain, hematuria, hesitancy, nausea, possible pregnancy, sweats or vomiting. The treatment provided moderate relief. There is no history of recurrent UTIs.  Female GU Problem  The patient's pertinent negatives include no genital itching, genital lesions, genital odor, genital rash, missed menses, pelvic pain, vaginal bleeding or vaginal discharge. Primary symptoms comment: frequency/uses pads. This is a chronic problem. The current episode started more than 1 year ago. The problem occurs constantly. The problem has been unchanged. The patient is experiencing no pain. Associated symptoms include frequency and urgency. Pertinent negatives include no abdominal pain, back pain, chills, constipation, diarrhea, dysuria, fever, flank pain, headaches, hematuria, joint pain, nausea, rash, sore throat or vomiting. There is no history of a gynecological surgery or menorrhagia.    No problem-specific Assessment & Plan notes found for this encounter.   Past Medical History:  Diagnosis Date  . Arthritis   . Blood in stool   . Cancer ([place])   . Chicken pox   . Diabetes mellitus 2000  . GERD (gastroesophageal reflux disease)   . Hiatal hernia   . Hypertension   . Kidney infection 2014  . Ovarian cancer ([place]) 1991   s/p  [facility]sterectomy at [facility]  . Pernicious anemia   . Spinal stenosis   . Ulcer   . Urinary incontinence     Past Surgical History:  Procedure Laterality Date  . ABDOMINAL HYSTERECTOMY    . FOOT SURGERY  2000  . SHOULDER SURGERY  2013   fracture right shoulder    Family History  Problem Relation Age of Onset  . Arthritis Mother   . Cancer Mother        colon  . Arthritis Father   . Cancer Father        colon  . Cancer Sister        lung  . Cancer Other        lung    Social History   Socioeconomic History  . Marital status: Widowed    Spouse name: Not on file  . Number of children: 2  . Years of education: Not on file  . Highest education level: 9th grade  Occupational History  . Occupation: Retired  Scientific laboratory technician  . Financial resource strain: Not hard at all  . Food insecurity:    Worry: Never true    Inability: Never true  . Transportation needs:    Medical: No    Non-medical: No  Tobacco Use  . Smoking status: Never Smoker  . Smokeless tobacco: Never Used  . Tobacco comment: smoking cessation materials not required  Substance and Sexual Activity  . Alcohol use: No  . Drug use: No  . Sexual activity: Never  Lifestyle  . Physical activity:    Days per  week: 0 days    Minutes per session: 0 min  . Stress: Not at all  Relationships  . Social connections:    Talks on phone: Patient refused    Gets together: Patient refused    Attends religious service: Patient refused    Active member of club or organization: Patient refused    Attends meetings of clubs or organizations: Patient refused    Relationship status: Widowed  . Intimate partner violence:    Fear of current or ex partner: No    Emotionally abused: No    Physically abused: No    Forced sexual activity: No  Other Topics Concern  . Not on file  Social History Narrative   Lives alone in [place]. 1 daughter living in [place]. Son died lung cancer.    Allergies  Allergen Reactions  . Aspirin  Other (See Comments)    bleeding  . Sulfa Antibiotics Swelling  . Oxybutynin Other (See Comments)    Dry mouth    Outpatient Medications Prior to Visit  Medication Sig Dispense Refill  . cholecalciferol (VITAMIN D) 1000 units tablet Take 2,000 Units by mouth daily.    . ciclopirox (PENLAC) 8 % solution Apply topically at bedtime. Apply over nail and surrounding skin. Apply daily over previous coat. After seven (7) days, may remove with alcohol and continue cycle. 6.6 mL 0  . cyanocobalamin (,VITAMIN B-12,) 1000 MCG/ML injection Inject 1,000 mcg into the muscle every 30 (thirty) days.    . ferrous sulfate 324 (65 Fe) MG TBEC Take 1 tablet (325 mg total) by mouth daily. 90 tablet 1  . gabapentin (NEURONTIN) 100 MG capsule TAKE 1 CAPSULE(100 MG) BY MOUTH EVERY NIGHT AT BEDTIME AS NEEDED 90 capsule 0  . glipiZIDE (GLUCOTROL XL) 10 MG 24 hr tablet Take 1 tablet (10 mg total) by mouth 2 (two) times daily. 180 tablet 1  . lisinopril (PRINIVIL,ZESTRIL) 5 MG tablet Take 1 tablet (5 mg total) by mouth daily. 90 tablet 3  . Melatonin 10 MG TABS Take 10 mg by mouth daily.    . metFORMIN (GLUCOPHAGE) 1000 MG tablet Take 1 tablet (1,000 mg total) by mouth 2 (two) times daily with a meal. 180 tablet 1  . pantoprazole (PROTONIX) 40 MG tablet Take 1 tablet (40 mg total) by mouth daily. 90 tablet 1  . sucralfate (CARAFATE) 1 G tablet Take 1 g by mouth as needed.     . Vitamin D, Ergocalciferol, (DRISDOL) 50000 units CAPS capsule Take 1 capsule (50,000 Units total) by mouth every 30 (thirty) days. 3 capsule 1  . fluticasone furoate-vilanterol (BREO ELLIPTA) 100-25 MCG/INH AEPB Inhale 1 puff into the lungs daily. (Patient not taking: Reported on 02/19/2018) 60 each 1  . gentamicin (GARAMYCIN) 0.3 % ophthalmic solution Place 1 drop into both eyes every 4 (four) hours. (Patient not taking: Reported on 03/24/2018) 5 mL 0  . naphazoline-pheniramine (NAPHCON-A) 0.025-0.3 % ophthalmic solution Place 1 drop into both eyes  4 (four) times daily as needed for eye irritation. (Patient not taking: Reported on 03/24/2018) 15 mL 0  . PROAIR HFA 108 (90 Base) MCG/ACT inhaler INHALE 2 PUFFS INTO THE LUNGS EVERY 6 HOURS AS NEEDED FOR WHEEZING OR SHORTNESS OF BREATH (Patient not taking: Reported on 02/19/2018) 8.5 g 1   Facility-Administered Medications Prior to Visit  Medication Dose Route Frequency Provider Last Rate Last Dose  . ipratropium-albuterol (DUONEB) 0.5-2.5 (3) MG/3ML nebulizer solution 3 mL  3 mL Nebulization Once Juline Patch, MD  Review of Systems  Constitutional: Negative for chills, fever, malaise/fatigue and weight loss.  HENT: Negative for ear discharge, ear pain and sore throat.   Eyes: Negative for blurred vision.  Respiratory: Negative for cough, sputum production, shortness of breath and wheezing.   Cardiovascular: Negative for chest pain, palpitations and leg swelling.  Gastrointestinal: Negative for abdominal pain, blood in stool, constipation, diarrhea, heartburn, melena, nausea and vomiting.  Genitourinary: Positive for frequency and urgency. Negative for dysuria, flank pain, hematuria, hesitancy, menorrhagia, missed menses, pelvic pain and vaginal discharge.       Incontinence  Musculoskeletal: Negative for back pain, joint pain, myalgias and neck pain.  Skin: Negative for rash.  Neurological: Negative for dizziness, tingling, sensory change, focal weakness and headaches.  Endo/Heme/Allergies: Negative for environmental allergies and polydipsia. Does not bruise/bleed easily.  Psychiatric/Behavioral: Negative for depression and suicidal ideas. The patient is not nervous/anxious and does not have insomnia.      Objective  Vitals:   03/24/18 1024  BP: 110/62  Pulse: 76  Weight: 198 lb (89.8 kg)  Height: 5\' 3"  (1.6 m)    Physical Exam  Constitutional: No distress.  HENT:  Head: Normocephalic and atraumatic.  Right Ear: External ear normal.  Left Ear: External ear normal.   Nose: Nose normal.  Mouth/Throat: Oropharynx is clear and moist.  Eyes: Pupils are equal, round, and reactive to light. Conjunctivae and EOM are normal. Right eye exhibits no discharge. Left eye exhibits no discharge.  Neck: Normal range of motion. Neck supple. No JVD present. No thyromegaly present.  Cardiovascular: Normal rate, regular rhythm, normal heart sounds and intact distal pulses. Exam reveals no gallop and no friction rub.  No murmur heard. Pulmonary/Chest: Effort normal and breath sounds normal.  Abdominal: Soft. Bowel sounds are normal. She exhibits no mass. There is tenderness in the suprapubic area. There is no guarding and no CVA tenderness.  Musculoskeletal: Normal range of motion. She exhibits no edema.  Lymphadenopathy:    She has no cervical adenopathy.  Neurological: She is alert. She has normal reflexes.  Skin: Skin is warm and dry. She is not diaphoretic.  Nursing note and vitals reviewed.     Assessment & Plan  Problem List Items Addressed This Visit    None    Visit Diagnoses    Cystitis    -  Primary   Acute with frequency/urgency. U/A notes nitrates. Possible urethritis Macrobid 100mg  bid for 10 days.   Relevant Medications   nitrofurantoin, macrocrystal-monohydrate, (MACROBID) 100 MG capsule   Urge incontinence       Previously treated by Dr Bernardo Heater. Will resume vesicare 10 mg daily. Patient to call if desires urology appt.   Relevant Medications   solifenacin (VESICARE) 10 MG tablet   nitrofurantoin, macrocrystal-monohydrate, (MACROBID) 100 MG capsule      Meds ordered this encounter  Medications  . solifenacin (VESICARE) 10 MG tablet    Sig: Take 1 tablet (10 mg total) by mouth daily.    Dispense:  30 tablet    Refill:  1  . nitrofurantoin, macrocrystal-monohydrate, (MACROBID) 100 MG capsule    Sig: Take 1 capsule (100 mg total) by mouth 2 (two) times daily.    Dispense:  10 capsule    Refill:  0      Dr. Otilio Miu [facility]  03/24/18

## 2018-03-24 NOTE — Addendum Note (Signed)
Addended by: Fredderick Severance on: 03/24/2018 03:52 PM   Modules accepted: Orders

## 2018-04-11 DIAGNOSIS — R809 Proteinuria, unspecified: Secondary | ICD-10-CM | POA: Diagnosis not present

## 2018-04-11 DIAGNOSIS — I10 Essential (primary) hypertension: Secondary | ICD-10-CM | POA: Diagnosis not present

## 2018-04-11 DIAGNOSIS — E1165 Type 2 diabetes mellitus with hyperglycemia: Secondary | ICD-10-CM | POA: Diagnosis not present

## 2018-04-28 ENCOUNTER — Ambulatory Visit (INDEPENDENT_AMBULATORY_CARE_PROVIDER_SITE_OTHER): Payer: Medicare Other

## 2018-04-28 DIAGNOSIS — D519 Vitamin B12 deficiency anemia, unspecified: Secondary | ICD-10-CM

## 2018-04-28 MED ORDER — CYANOCOBALAMIN 1000 MCG/ML IJ SOLN
1000.0000 ug | Freq: Once | INTRAMUSCULAR | Status: AC
  Administered 2018-04-28: 1000 ug via INTRAMUSCULAR

## 2018-04-28 NOTE — Progress Notes (Signed)
Gave pt B12 inj- not seen by doctor

## 2018-05-07 ENCOUNTER — Encounter: Payer: Self-pay | Admitting: Family Medicine

## 2018-05-07 ENCOUNTER — Ambulatory Visit (INDEPENDENT_AMBULATORY_CARE_PROVIDER_SITE_OTHER): Payer: Medicare Other | Admitting: Family Medicine

## 2018-05-07 VITALS — BP 120/80 | HR 84 | Ht 63.0 in | Wt 201.0 lb

## 2018-05-07 DIAGNOSIS — L299 Pruritus, unspecified: Secondary | ICD-10-CM

## 2018-05-07 DIAGNOSIS — L2084 Intrinsic (allergic) eczema: Secondary | ICD-10-CM | POA: Diagnosis not present

## 2018-05-07 MED ORDER — HYDROXYZINE HCL 25 MG PO TABS: 25.0000 mg | ORAL_TABLET | Freq: Every day | ORAL | 1 refills | Status: DC

## 2018-05-07 MED ORDER — LORATADINE 10 MG PO TABS: 10.0000 mg | ORAL_TABLET | Freq: Every day | ORAL | 11 refills | Status: DC

## 2018-05-07 NOTE — Progress Notes (Signed)
Name: Brenda [name]   MRN: [id number]    DOB: Oct 14, 1930   Date:05/07/2018       Progress Note  Subjective  Chief Complaint  Chief Complaint  Patient presents with  . Rash    skin itching all over- scalp, arms and hands, back    Rash  This is a recurrent (gotten worse last five days.) problem. The current episode started more than 1 year ago. The problem has been gradually worsening since onset. The rash is diffuse. The rash is characterized by itchiness and dryness. Pertinent negatives include no anorexia, congestion, cough, diarrhea, eye pain, facial edema, fatigue, fever, joint pain, nail changes, rhinorrhea, shortness of breath, sore throat or vomiting. Past treatments include nothing. The treatment provided moderate relief.    No problem-specific Assessment & Plan notes found for this encounter.   Past Medical History:  Diagnosis Date  . Arthritis   . Blood in stool   . Cancer ([place])   . Chicken pox   . Diabetes mellitus 2000  . GERD (gastroesophageal reflux disease)   . Hiatal hernia   . Hypertension   . Kidney infection 2014  . Ovarian cancer ([place]) 1991   s/p hysterectomy at [facility]  . Pernicious anemia   . Spinal stenosis   . Ulcer   . Urinary incontinence     Past Surgical History:  Procedure Laterality Date  . ABDOMINAL HYSTERECTOMY    . FOOT SURGERY  2000  . SHOULDER SURGERY  2013   fracture right shoulder    Family History  Problem Relation Age of Onset  . Arthritis Mother   . Cancer Mother        colon  . Arthritis Father   . Cancer Father        colon  . Cancer Sister        lung  . Cancer Other        lung    Social History   Socioeconomic History  . Marital status: Widowed    Spouse name: Not on file  . Number of children: 2  . Years of education: Not on file  . Highest education level: 9th grade  Occupational History  . Occupation: Retired  Scientific laboratory technician  . Financial resource strain: Not hard at all  . Food insecurity:    Worry: Never  true    Inability: Never true  . Transportation needs:    Medical: No    Non-medical: No  Tobacco Use  . Smoking status: Never Smoker  . Smokeless tobacco: Never Used  . Tobacco comment: smoking cessation materials not required  Substance and Sexual Activity  . Alcohol use: No  . Drug use: No  . Sexual activity: Never  Lifestyle  . Physical activity:    Days per week: 0 days    Minutes per session: 0 min  . Stress: Not at all  Relationships  . Social connections:    Talks on phone: Patient refused    Gets together: Patient refused    Attends religious service: Patient refused    Active member of club or organization: Patient refused    Attends meetings of clubs or organizations: Patient refused    Relationship status: Widowed  . Intimate partner violence:    Fear of current or ex partner: No    Emotionally abused: No    Physically abused: No    Forced sexual activity: No  Other Topics Concern  . Not on file  Social History Narrative  Lives alone in [place]. 1 daughter living in [place]. Son died lung cancer.    Allergies  Allergen Reactions  . Aspirin Other (See Comments)    bleeding  . Sulfa Antibiotics Swelling  . Oxybutynin Other (See Comments)    Dry mouth    Outpatient Medications Prior to Visit  Medication Sig Dispense Refill  . cholecalciferol (VITAMIN D) 1000 units tablet Take 2,000 Units by mouth daily.    . ciclopirox (PENLAC) 8 % solution Apply topically at bedtime. Apply over nail and surrounding skin. Apply daily over previous coat. After seven (7) days, may remove with alcohol and continue cycle. 6.6 mL 0  . cyanocobalamin (,VITAMIN B-12,) 1000 MCG/ML injection Inject 1,000 mcg into the muscle every 30 (thirty) days.    . ferrous sulfate 324 (65 Fe) MG TBEC Take 1 tablet (325 mg total) by mouth daily. 90 tablet 1  . gabapentin (NEURONTIN) 100 MG capsule TAKE 1 CAPSULE(100 MG) BY MOUTH EVERY NIGHT AT BEDTIME AS NEEDED 90 capsule 0  . glipiZIDE  (GLUCOTROL XL) 10 MG 24 hr tablet Take 1 tablet (10 mg total) by mouth 2 (two) times daily. 180 tablet 1  . lisinopril (PRINIVIL,ZESTRIL) 5 MG tablet Take 1 tablet (5 mg total) by mouth daily. 90 tablet 3  . Melatonin 10 MG TABS Take 10 mg by mouth daily.    . metFORMIN (GLUCOPHAGE) 1000 MG tablet Take 1 tablet (1,000 mg total) by mouth 2 (two) times daily with a meal. 180 tablet 1  . pantoprazole (PROTONIX) 40 MG tablet Take 1 tablet (40 mg total) by mouth daily. 90 tablet 1  . solifenacin (VESICARE) 10 MG tablet Take 1 tablet (10 mg total) by mouth daily. 30 tablet 1  . sucralfate (CARAFATE) 1 G tablet Take 1 g by mouth as needed.     . fluticasone furoate-vilanterol (BREO ELLIPTA) 100-25 MCG/INH AEPB Inhale 1 puff into the lungs daily. (Patient not taking: Reported on 02/19/2018) 60 each 1  . naphazoline-pheniramine (NAPHCON-A) 0.025-0.3 % ophthalmic solution Place 1 drop into both eyes 4 (four) times daily as needed for eye irritation. (Patient not taking: Reported on 03/24/2018) 15 mL 0  . PROAIR HFA 108 (90 Base) MCG/ACT inhaler INHALE 2 PUFFS INTO THE LUNGS EVERY 6 HOURS AS NEEDED FOR WHEEZING OR SHORTNESS OF BREATH (Patient not taking: Reported on 02/19/2018) 8.5 g 1  . gentamicin (GARAMYCIN) 0.3 % ophthalmic solution Place 1 drop into both eyes every 4 (four) hours. (Patient not taking: Reported on 03/24/2018) 5 mL 0  . nitrofurantoin, macrocrystal-monohydrate, (MACROBID) 100 MG capsule Take 1 capsule (100 mg total) by mouth 2 (two) times daily. 10 capsule 0   Facility-Administered Medications Prior to Visit  Medication Dose Route Frequency Provider Last Rate Last Dose  . ipratropium-albuterol (DUONEB) 0.5-2.5 (3) MG/3ML nebulizer solution 3 mL  3 mL Nebulization Once Juline Patch, MD        Review of Systems  Constitutional: Negative for chills, fatigue, fever, malaise/fatigue and weight loss.  HENT: Negative for congestion, ear discharge, ear pain, rhinorrhea and sore throat.   Eyes:  Negative for blurred vision and pain.  Respiratory: Negative for cough, sputum production, shortness of breath and wheezing.   Cardiovascular: Negative for chest pain, palpitations and leg swelling.  Gastrointestinal: Negative for abdominal pain, anorexia, blood in stool, constipation, diarrhea, heartburn, melena, nausea and vomiting.  Genitourinary: Negative for dysuria, frequency, hematuria and urgency.  Musculoskeletal: Negative for back pain, joint pain, myalgias and neck pain.  Skin: Positive  for rash. Negative for nail changes.  Neurological: Negative for dizziness, tingling, sensory change, focal weakness and headaches.  Endo/Heme/Allergies: Negative for environmental allergies and polydipsia. Does not bruise/bleed easily.  Psychiatric/Behavioral: Negative for depression and suicidal ideas. The patient is not nervous/anxious and does not have insomnia.      Objective  Vitals:   05/07/18 1105  BP: 120/80  Pulse: 84  Weight: 201 lb (91.2 kg)  Height: 5\' 3"  (1.6 m)    Physical Exam  Constitutional: She is oriented to person, place, and time. She appears well-developed and well-nourished.  HENT:  Head: Normocephalic.  Right Ear: External ear normal.  Left Ear: External ear normal.  Mouth/Throat: Oropharynx is clear and moist.  Eyes: Pupils are equal, round, and reactive to light. Conjunctivae and EOM are normal. Lids are everted and swept, no foreign bodies found. Left eye exhibits no hordeolum. No foreign body present in the left eye. Right conjunctiva is not injected. Left conjunctiva is not injected. No scleral icterus.  Neck: Normal range of motion. Neck supple. No JVD present. No tracheal deviation present. No thyromegaly present.  Cardiovascular: Normal rate, regular rhythm, normal heart sounds and intact distal pulses. Exam reveals no gallop and no friction rub.  No murmur heard. Pulmonary/Chest: Effort normal and breath sounds normal. No respiratory distress. She has no  wheezes. She has no rales.  Abdominal: Soft. Bowel sounds are normal. She exhibits no mass. There is no hepatosplenomegaly. There is no tenderness. There is no rebound and no guarding.  Musculoskeletal: Normal range of motion. She exhibits no edema or tenderness.  Lymphadenopathy:    She has no cervical adenopathy.  Neurological: She is alert and oriented to person, place, and time. She has normal strength. She displays normal reflexes. No cranial nerve deficit.  Skin: Skin is warm, dry and intact. No rash noted. She is not diaphoretic. No pallor.  Psychiatric: She has a normal mood and affect. Her mood appears not anxious. She does not exhibit a depressed mood.  Nursing note and vitals reviewed.     Assessment & Plan  Problem List Items Addressed This Visit    None    Visit Diagnoses    Intrinsic atopic dermatitis    -  Primary   Chronic with recent exacerbation. Prescribe loratadine 10 mg daily, hydoxyzine 25 mg q hs and bid application of GOLDBOND moisturizing /eczema lotion OTC   Relevant Orders   Bilirubin, Total   Pruritus       Generalized with dark urine. will check total bilirubin.       Meds ordered this encounter  Medications  . loratadine (CLARITIN) 10 MG tablet    Sig: Take 1 tablet (10 mg total) by mouth daily.    Dispense:  30 tablet    Refill:  11  . hydrOXYzine (ATARAX/VISTARIL) 25 MG tablet    Sig: Take 1 tablet (25 mg total) by mouth at bedtime.    Dispense:  30 tablet    Refill:  1      Dr. [facility]  05/07/18

## 2018-05-07 NOTE — Patient Instructions (Signed)

## 2018-05-08 LAB — BILIRUBIN, TOTAL: Bilirubin Total: 0.2 mg/dL (ref 0.0–1.2)

## 2018-06-04 ENCOUNTER — Ambulatory Visit (INDEPENDENT_AMBULATORY_CARE_PROVIDER_SITE_OTHER): Payer: Medicare Other

## 2018-06-04 DIAGNOSIS — D519 Vitamin B12 deficiency anemia, unspecified: Secondary | ICD-10-CM | POA: Diagnosis not present

## 2018-06-04 MED ORDER — CYANOCOBALAMIN 1000 MCG/ML IJ SOLN
1000.0000 ug | Freq: Once | INTRAMUSCULAR | Status: AC
  Administered 2018-06-04: 1000 ug via INTRAMUSCULAR

## 2018-06-13 DIAGNOSIS — E119 Type 2 diabetes mellitus without complications: Secondary | ICD-10-CM | POA: Diagnosis not present

## 2018-06-13 DIAGNOSIS — H35372 Puckering of macula, left eye: Secondary | ICD-10-CM | POA: Diagnosis not present

## 2018-06-13 LAB — HM DIABETES EYE EXAM

## 2018-06-20 ENCOUNTER — Other Ambulatory Visit: Payer: Self-pay

## 2018-07-16 ENCOUNTER — Other Ambulatory Visit: Payer: Self-pay

## 2018-07-16 ENCOUNTER — Ambulatory Visit
Admission: EM | Admit: 2018-07-16 | Discharge: 2018-07-16 | Disposition: A | Discharge status: Home / Self Care | Payer: Medicare Other | Attending: Family Medicine | Admitting: Family Medicine

## 2018-07-16 ENCOUNTER — Encounter: Payer: Self-pay | Admitting: Emergency Medicine

## 2018-07-16 DIAGNOSIS — L309 Dermatitis, unspecified: Secondary | ICD-10-CM

## 2018-07-16 MED ORDER — HYDROCORTISONE 1 % EX LOTN: 1.0000 "application " | TOPICAL_LOTION | Freq: Two times a day (BID) | CUTANEOUS | 0 refills | Status: DC

## 2018-07-16 NOTE — ED Triage Notes (Signed)
Patient c/o itching all over her body for the past month and recently her scalp has started itching.  Patient reports her itching is worse at night and keeps her from sleeping.

## 2018-07-16 NOTE — ED Provider Notes (Signed)
[facility]    CSN: [id number] Arrival date & time: 07/16/18  0093     History   Chief Complaint Chief Complaint  Patient presents with  . Pruritis    HPI Brenda [name] is a 84 y.o. female.   84 yo female with a c/o itching all over her body for the past 1-2 months and recently her scalp has been itching more. Denies any redness, fevers, chills. Itching seems to worsen at night.   The history is provided by the patient.    Past Medical History:  Diagnosis Date  . Arthritis   . Blood in stool   . Cancer ([place])   . Chicken pox   . Diabetes mellitus 2000  . GERD (gastroesophageal reflux disease)   . Hiatal hernia   . Hypertension   . Kidney infection 2014  . Ovarian cancer ([place]) 1991   s/p hysterectomy at [facility]  . Pernicious anemia   . Spinal stenosis   . Ulcer   . Urinary incontinence     Patient Active Problem List   Diagnosis Date Noted  . Screening for breast cancer 08/18/2015  . B12 deficiency 12/02/2014  . Lumbago 07/09/2014  . Shortness of breath 06/30/2014  . Obese 06/30/2014  . Right facial pain 05/18/2014  . Anemia 01/01/2014  . Osteoarthritis 07/31/2013  . Other malaise and fatigue 07/09/2013  . Muscle cramp, nocturnal 03/25/2013  . Medicare annual wellness visit, subsequent 12/02/2012  . Dermatitis 12/02/2012  . Other and unspecified hyperlipidemia 12/02/2012  . Essential hypertension, benign 12/02/2012  . Chronic low back pain 08/28/2012  . Osteoporosis 07/23/2012  . Paresthesia of bilateral legs 05/30/2012  . Pernicious anemia 05/30/2012  . Diabetes mellitus type 2 with complications ([place]) 81/82/9937  . Depression 05/30/2012  . Insomnia 05/30/2012  . Weakness of both legs 05/30/2012    Past Surgical History:  Procedure Laterality Date  . ABDOMINAL HYSTERECTOMY    . FOOT SURGERY  2000  . SHOULDER SURGERY  2013   fracture right shoulder    OB History   None      Home Medications    Prior to Admission medications     Medication Sig Start Date End Date Taking? Authorizing Provider  cyanocobalamin (,VITAMIN B-12,) 1000 MCG/ML injection Inject 1,000 mcg into the muscle every 30 (thirty) days.   Yes [provider]  gabapentin (NEURONTIN) 100 MG capsule TAKE 1 CAPSULE(100 MG) BY MOUTH EVERY NIGHT AT BEDTIME AS NEEDED 04/15/17  Yes Juline Patch, MD  glipiZIDE (GLUCOTROL XL) 10 MG 24 hr tablet Take 1 tablet (10 mg total) by mouth 2 (two) times daily. 04/11/16  Yes Juline Patch, MD  Melatonin 10 MG TABS Take 10 mg by mouth daily.   Yes [provider]  metFORMIN (GLUCOPHAGE) 1000 MG tablet Take 1 tablet (1,000 mg total) by mouth 2 (two) times daily with a meal. 04/11/16  Yes Juline Patch, MD  pantoprazole (PROTONIX) 40 MG tablet Take 1 tablet (40 mg total) by mouth daily. 04/11/16  Yes Juline Patch, MD  cholecalciferol (VITAMIN D) 1000 units tablet Take 2,000 Units by mouth daily.    [provider]  ciclopirox (PENLAC) 8 % solution Apply topically at bedtime. Apply over nail and surrounding skin. Apply daily over previous coat. After seven (7) days, may remove with alcohol and continue cycle. 02/21/18   Juline Patch, MD  ferrous sulfate 324 (65 Fe) MG TBEC Take 1 tablet (325 mg total) by mouth  daily. 04/11/16   Juline Patch, MD  fluticasone furoate-vilanterol (BREO ELLIPTA) 100-25 MCG/INH AEPB Inhale 1 puff into the lungs daily. Patient not taking: Reported on 02/19/2018 11/16/16   Juline Patch, MD  hydrocortisone 1 % lotion Apply 1 application topically 2 (two) times daily. 07/16/18   Norval Gable, MD  hydrOXYzine (ATARAX/VISTARIL) 25 MG tablet Take 1 tablet (25 mg total) by mouth at bedtime. 05/07/18   Juline Patch, MD  lisinopril (PRINIVIL,ZESTRIL) 5 MG tablet Take 1 tablet (5 mg total) by mouth daily. 02/21/18   Juline Patch, MD  loratadine (CLARITIN) 10 MG tablet Take 1 tablet (10 mg total) by mouth daily. 05/07/18   Juline Patch, MD  naphazoline-pheniramine (NAPHCON-A)  0.025-0.3 % ophthalmic solution Place 1 drop into both eyes 4 (four) times daily as needed for eye irritation. Patient not taking: Reported on 03/24/2018 05/09/17   Sable Feil, PA-C  PROAIR HFA 108 [phone] Base) MCG/ACT inhaler INHALE 2 PUFFS INTO THE LUNGS EVERY 6 HOURS AS NEEDED FOR WHEEZING OR SHORTNESS OF BREATH Patient not taking: Reported on 02/19/2018 05/16/17   Juline Patch, MD  solifenacin (VESICARE) 10 MG tablet Take 1 tablet (10 mg total) by mouth daily. 03/24/18   Juline Patch, MD  sucralfate (CARAFATE) 1 G tablet Take 1 g by mouth as needed.     [provider]    Family History Family History  Problem Relation Age of Onset  . Arthritis Mother   . Cancer Mother        colon  . Arthritis Father   . Cancer Father        colon  . Cancer Sister        lung  . Cancer Other        lung    Social History Social History   Tobacco Use  . Smoking status: Never Smoker  . Smokeless tobacco: Never Used  . Tobacco comment: smoking cessation materials not required  Substance Use Topics  . Alcohol use: No  . Drug use: No     Allergies   Aspirin; Sulfa antibiotics; and Oxybutynin   Review of Systems Review of Systems   Physical Exam Triage Vital Signs ED Triage Vitals  Enc Vitals Group     BP 07/16/18 1015 111/90     Pulse Rate 07/16/18 1015 85     Resp 07/16/18 1015 16     Temp 07/16/18 1015 98.3 F (36.8 C)     Temp Source 07/16/18 1015 Oral     SpO2 07/16/18 1015 98 %     Weight 07/16/18 1011 210 lb (95.3 kg)     Height 07/16/18 1011 5\' 4"  (1.626 m)     Head Circumference --      Peak Flow --      Pain Score 07/16/18 1010 0     Pain Loc --      Pain Edu? --      Excl. in [place]? --    No data found.  Updated Vital Signs BP 111/90 (BP Location: Right Arm)   Pulse 85   Temp 98.3 F (36.8 C) (Oral)   Resp 16   Ht 5\' 4"  (1.626 m)   Wt 95.3 kg   SpO2 98%   BMI 36.05 kg/m   Visual Acuity Right Eye Distance:   Left Eye Distance:     Bilateral Distance:    Right Eye Near:   Left Eye Near:    Bilateral  Near:     Physical Exam  Constitutional: She appears well-developed and well-nourished. No distress.  Skin: She is not diaphoretic.  General dry skin on extremities and trunk; erythematous, scaly patches on scalp  Nursing note and vitals reviewed.    UC Treatments / Results  Labs (all labs ordered are listed, but only abnormal results are displayed) Labs Reviewed - No data to display  EKG None  Radiology No results found.  Procedures Procedures (including critical care time)  Medications Ordered in UC Medications - No data to display  Initial Impression / Assessment and Plan / UC Course  I have reviewed the triage vital signs and the nursing notes.  Pertinent labs & imaging results that were available during my care of the patient were reviewed by me and considered in my medical decision making (see chart for details).      Final Clinical Impressions(s) / UC Diagnoses   Final diagnoses:  Eczema, unspecified type     Discharge Instructions     Benadryl 25mg  one tablet at night Aveeno soap and moisturizer lotiont    ED Prescriptions    Medication Sig Dispense Auth. Provider   hydrocortisone 1 % lotion Apply 1 application topically 2 (two) times daily. 118 mL Norval Gable, MD      1. diagnosis reviewed with patient 2. rx as per orders above; reviewed possible side effects, interactions, risks and benefits  3. Recommend supportive treatment as above  4. Follow-up prn if symptoms worsen or don't improve   Controlled Substance Prescriptions [place] Controlled Substance Registry consulted? Not Applicable   Norval Gable, MD 07/16/18 [phone]

## 2018-07-16 NOTE — Discharge Instructions (Addendum)
Benadryl 25mg  one tablet at night Aveeno soap and moisturizer lotiont

## 2018-07-17 ENCOUNTER — Ambulatory Visit (INDEPENDENT_AMBULATORY_CARE_PROVIDER_SITE_OTHER): Payer: Medicare Other

## 2018-07-17 DIAGNOSIS — D519 Vitamin B12 deficiency anemia, unspecified: Secondary | ICD-10-CM | POA: Diagnosis not present

## 2018-07-17 MED ORDER — CYANOCOBALAMIN 1000 MCG/ML IJ SOLN
1000.0000 ug | Freq: Once | INTRAMUSCULAR | Status: AC
  Administered 2018-07-17: 1000 ug via INTRAMUSCULAR

## 2018-07-22 DIAGNOSIS — R809 Proteinuria, unspecified: Secondary | ICD-10-CM | POA: Diagnosis not present

## 2018-07-22 DIAGNOSIS — E1129 Type 2 diabetes mellitus with other diabetic kidney complication: Secondary | ICD-10-CM | POA: Diagnosis not present

## 2018-07-22 DIAGNOSIS — E1159 Type 2 diabetes mellitus with other circulatory complications: Secondary | ICD-10-CM | POA: Diagnosis not present

## 2018-07-22 DIAGNOSIS — E1165 Type 2 diabetes mellitus with hyperglycemia: Secondary | ICD-10-CM | POA: Diagnosis not present

## 2018-07-22 DIAGNOSIS — I10 Essential (primary) hypertension: Secondary | ICD-10-CM | POA: Diagnosis not present

## 2018-07-25 DIAGNOSIS — E1142 Type 2 diabetes mellitus with diabetic polyneuropathy: Secondary | ICD-10-CM | POA: Insufficient documentation

## 2018-08-11 ENCOUNTER — Other Ambulatory Visit: Payer: Self-pay

## 2018-08-11 DIAGNOSIS — L2084 Intrinsic (allergic) eczema: Secondary | ICD-10-CM

## 2018-08-11 NOTE — Progress Notes (Unsigned)
Ref derm

## 2018-08-14 DIAGNOSIS — L2089 Other atopic dermatitis: Secondary | ICD-10-CM | POA: Diagnosis not present

## 2018-08-14 DIAGNOSIS — Z23 Encounter for immunization: Secondary | ICD-10-CM | POA: Diagnosis not present

## 2018-08-14 DIAGNOSIS — L218 Other seborrheic dermatitis: Secondary | ICD-10-CM | POA: Diagnosis not present

## 2018-08-28 ENCOUNTER — Ambulatory Visit (INDEPENDENT_AMBULATORY_CARE_PROVIDER_SITE_OTHER): Payer: Medicare Other

## 2018-08-28 DIAGNOSIS — E538 Deficiency of other specified B group vitamins: Secondary | ICD-10-CM | POA: Diagnosis not present

## 2018-08-28 MED ORDER — CYANOCOBALAMIN 1000 MCG/ML IJ SOLN
1000.0000 ug | Freq: Once | INTRAMUSCULAR | Status: AC
  Administered 2018-08-28: 1000 ug via INTRAMUSCULAR

## 2018-09-05 DIAGNOSIS — H04123 Dry eye syndrome of bilateral lacrimal glands: Secondary | ICD-10-CM | POA: Diagnosis not present

## 2018-09-12 ENCOUNTER — Telehealth: Payer: Self-pay

## 2018-09-12 NOTE — Telephone Encounter (Signed)
Pt called in and said she was not running a fever, but is congested, runny nose and wanted to know what to do. She was told to pick up coricidin otc and she "has a nasal spray at home" to use

## 2018-09-16 ENCOUNTER — Encounter: Payer: Self-pay | Admitting: Family Medicine

## 2018-09-16 ENCOUNTER — Ambulatory Visit (INDEPENDENT_AMBULATORY_CARE_PROVIDER_SITE_OTHER): Payer: Medicare Other | Admitting: Family Medicine

## 2018-09-16 VITALS — BP 112/80 | HR 112 | Temp 98.5°F | Ht 64.0 in | Wt 193.0 lb

## 2018-09-16 DIAGNOSIS — J0100 Acute maxillary sinusitis, unspecified: Secondary | ICD-10-CM | POA: Diagnosis not present

## 2018-09-16 DIAGNOSIS — Z6833 Body mass index (BMI) 33.0-33.9, adult: Secondary | ICD-10-CM | POA: Diagnosis not present

## 2018-09-16 MED ORDER — AMOXICILLIN 500 MG PO CAPS: 500.0000 mg | ORAL_CAPSULE | Freq: Three times a day (TID) | ORAL | 0 refills | Status: DC

## 2018-09-16 NOTE — Progress Notes (Signed)
Date:  09/16/2018   Name:  Brenda [name]   DOB:  [birth year]   MRN:  [id number]   Chief Complaint: Sinusitis (green production, sob)  Sinusitis  This is a new problem. The current episode started in the past 7 days. The problem has been gradually worsening since onset. There has been no fever. The fever has been present for 3 to 4 days. Associated symptoms include congestion, headaches and sinus pressure. Pertinent negatives include no chills, coughing, diaphoresis, ear pain, hoarse voice, neck pain, shortness of breath, sneezing, sore throat or swollen glands. Past treatments include nothing. The treatment provided mild relief.    Review of Systems  Constitutional: Negative.  Negative for chills, diaphoresis, fatigue, fever and unexpected weight change.  HENT: Positive for congestion and sinus pressure. Negative for ear discharge, ear pain, hoarse voice, rhinorrhea, sneezing and sore throat.   Eyes: Negative for photophobia, pain, discharge, redness and itching.  Respiratory: Negative for cough, shortness of breath, wheezing and stridor.   Gastrointestinal: Negative for abdominal pain, blood in stool, constipation, diarrhea, nausea and vomiting.  Endocrine: Negative for cold intolerance, heat intolerance, polydipsia, polyphagia and polyuria.  Genitourinary: Negative for dysuria, flank pain, frequency, hematuria, menstrual problem, pelvic pain, urgency, vaginal bleeding and vaginal discharge.  Musculoskeletal: Negative for arthralgias, back pain, myalgias and neck pain.  Skin: Negative for rash.  Allergic/Immunologic: Negative for environmental allergies and food allergies.  Neurological: Positive for headaches. Negative for dizziness, weakness, light-headedness and numbness.  Hematological: Negative for adenopathy. Does not bruise/bleed easily.  Psychiatric/Behavioral: Negative for dysphoric mood. The patient is not nervous/anxious.     Patient Active Problem List   Diagnosis Date Noted    . Screening for breast cancer 08/18/2015  . B12 deficiency 12/02/2014  . Lumbago 07/09/2014  . Shortness of breath 06/30/2014  . Obese 06/30/2014  . Right facial pain 05/18/2014  . Anemia 01/01/2014  . Osteoarthritis 07/31/2013  . Other malaise and fatigue 07/09/2013  . Muscle cramp, nocturnal 03/25/2013  . Medicare annual wellness visit, subsequent 12/02/2012  . Dermatitis 12/02/2012  . Other and unspecified hyperlipidemia 12/02/2012  . Essential hypertension, benign 12/02/2012  . Chronic low back pain 08/28/2012  . Osteoporosis 07/23/2012  . Paresthesia of bilateral legs 05/30/2012  . Pernicious anemia 05/30/2012  . Diabetes mellitus type 2 with complications ([place]) 42/87/6811  . Depression 05/30/2012  . Insomnia 05/30/2012  . Weakness of both legs 05/30/2012    Allergies  Allergen Reactions  . Aspirin Other (See Comments)    bleeding  . Sulfa Antibiotics Swelling  . Oxybutynin Other (See Comments)    Dry mouth    Past Surgical History:  Procedure Laterality Date  . ABDOMINAL HYSTERECTOMY    . FOOT SURGERY  2000  . SHOULDER SURGERY  2013   fracture right shoulder    Social History   Tobacco Use  . Smoking status: Never Smoker  . Smokeless tobacco: Never Used  . Tobacco comment: smoking cessation materials not required  Substance Use Topics  . Alcohol use: No  . Drug use: No     Medication list has been reviewed and updated.  Current Meds  Medication Sig  . cholecalciferol (VITAMIN D) 1000 units tablet Take 2,000 Units by mouth daily.  . ciclopirox (PENLAC) 8 % solution Apply topically at bedtime. Apply over nail and surrounding skin. Apply daily over previous coat. After seven (7) days, may remove with alcohol and continue cycle.  . cyanocobalamin (,VITAMIN B-12,) 1000 MCG/ML injection Inject 1,000  mcg into the muscle every 30 (thirty) days.  . ferrous sulfate 324 (65 Fe) MG TBEC Take 1 tablet (325 mg total) by mouth daily.  . fluticasone  furoate-vilanterol (BREO ELLIPTA) 100-25 MCG/INH AEPB Inhale 1 puff into the lungs daily.  Marland Kitchen gabapentin (NEURONTIN) 100 MG capsule TAKE 1 CAPSULE(100 MG) BY MOUTH EVERY NIGHT AT BEDTIME AS NEEDED  . glipiZIDE (GLUCOTROL XL) 10 MG 24 hr tablet Take 1 tablet (10 mg total) by mouth 2 (two) times daily.  . hydrocortisone 1 % lotion Apply 1 application topically 2 (two) times daily.  . hydrOXYzine (ATARAX/VISTARIL) 25 MG tablet Take 1 tablet (25 mg total) by mouth at bedtime.  Marland Kitchen lisinopril (PRINIVIL,ZESTRIL) 5 MG tablet Take 1 tablet (5 mg total) by mouth daily.  Marland Kitchen loratadine (CLARITIN) 10 MG tablet Take 1 tablet (10 mg total) by mouth daily.  . Melatonin 10 MG TABS Take 10 mg by mouth daily.  . metFORMIN (GLUCOPHAGE) 1000 MG tablet Take 1 tablet (1,000 mg total) by mouth 2 (two) times daily with a meal.  . pantoprazole (PROTONIX) 40 MG tablet Take 1 tablet (40 mg total) by mouth daily.  Marland Kitchen PROAIR HFA 108 (90 Base) MCG/ACT inhaler INHALE 2 PUFFS INTO THE LUNGS EVERY 6 HOURS AS NEEDED FOR WHEEZING OR SHORTNESS OF BREATH  . solifenacin (VESICARE) 10 MG tablet Take 1 tablet (10 mg total) by mouth daily.  . sucralfate (CARAFATE) 1 G tablet Take 1 g by mouth as needed.    Current Facility-Administered Medications for the 09/16/18 encounter (Office Visit) with Juline Patch, MD  Medication  . ipratropium-albuterol (DUONEB) 0.5-2.5 (3) MG/3ML nebulizer solution 3 mL    PHQ 2/9 Scores 02/19/2018 02/18/2017 01/23/2016 11/04/2015  PHQ - 2 Score 0 0 0 0  PHQ- 9 Score 0 - - -    Physical Exam Vitals signs and nursing note reviewed.  Constitutional:      General: She is not in acute distress.    Appearance: She is not diaphoretic.  HENT:     Head: Normocephalic and atraumatic.     Right Ear: Hearing, tympanic membrane, ear canal and external ear normal.     Left Ear: Hearing, tympanic membrane, ear canal and external ear normal.     Nose:     Right Sinus: Maxillary sinus tenderness present. No frontal  sinus tenderness.     Left Sinus: No maxillary sinus tenderness or frontal sinus tenderness.     Mouth/Throat:     Pharynx: No pharyngeal swelling, oropharyngeal exudate or posterior oropharyngeal erythema.  Eyes:     General:        Right eye: No discharge.        Left eye: No discharge.     Conjunctiva/sclera: Conjunctivae normal.     Pupils: Pupils are equal, round, and reactive to light.  Neck:     Musculoskeletal: Normal range of motion and neck supple.     Thyroid: No thyromegaly.     Vascular: No JVD.  Cardiovascular:     Rate and Rhythm: Normal rate and regular rhythm.     Heart sounds: Normal heart sounds. No murmur. No friction rub. No gallop.   Pulmonary:     Effort: Pulmonary effort is normal.     Breath sounds: Normal breath sounds. No wheezing or rhonchi.  Abdominal:     General: Bowel sounds are normal.     Palpations: Abdomen is soft. There is no mass.     Tenderness: There is no abdominal  tenderness. There is no guarding or rebound.  Musculoskeletal: Normal range of motion.  Lymphadenopathy:     Cervical: No cervical adenopathy.  Skin:    General: Skin is warm and dry.  Neurological:     Mental Status: She is alert.     Deep Tendon Reflexes: Reflexes are normal and symmetric.     BP 112/80   Pulse (!) 112   Temp 98.5 F (36.9 C) (Oral)   Ht 5\' 4"  (1.626 m)   Wt 193 lb (87.5 kg)   SpO2 98%   BMI 33.13 kg/m   Assessment and Plan:

## 2018-09-25 DIAGNOSIS — L2089 Other atopic dermatitis: Secondary | ICD-10-CM | POA: Diagnosis not present

## 2018-09-25 DIAGNOSIS — L218 Other seborrheic dermatitis: Secondary | ICD-10-CM | POA: Diagnosis not present

## 2018-10-24 DIAGNOSIS — E559 Vitamin D deficiency, unspecified: Secondary | ICD-10-CM | POA: Diagnosis not present

## 2018-10-24 DIAGNOSIS — Z79899 Other long term (current) drug therapy: Secondary | ICD-10-CM | POA: Diagnosis not present

## 2018-10-24 DIAGNOSIS — E1165 Type 2 diabetes mellitus with hyperglycemia: Secondary | ICD-10-CM | POA: Diagnosis not present

## 2018-10-28 DIAGNOSIS — E1165 Type 2 diabetes mellitus with hyperglycemia: Secondary | ICD-10-CM | POA: Diagnosis not present

## 2018-10-28 DIAGNOSIS — E1142 Type 2 diabetes mellitus with diabetic polyneuropathy: Secondary | ICD-10-CM | POA: Diagnosis not present

## 2018-10-28 DIAGNOSIS — E1129 Type 2 diabetes mellitus with other diabetic kidney complication: Secondary | ICD-10-CM | POA: Diagnosis not present

## 2018-10-28 DIAGNOSIS — R809 Proteinuria, unspecified: Secondary | ICD-10-CM | POA: Diagnosis not present

## 2018-10-28 DIAGNOSIS — E559 Vitamin D deficiency, unspecified: Secondary | ICD-10-CM | POA: Diagnosis not present

## 2018-10-28 DIAGNOSIS — E1159 Type 2 diabetes mellitus with other circulatory complications: Secondary | ICD-10-CM | POA: Diagnosis not present

## 2018-10-28 DIAGNOSIS — I10 Essential (primary) hypertension: Secondary | ICD-10-CM | POA: Diagnosis not present

## 2018-11-18 ENCOUNTER — Other Ambulatory Visit: Payer: Self-pay

## 2018-11-18 ENCOUNTER — Encounter: Payer: Self-pay | Admitting: Family Medicine

## 2018-11-18 ENCOUNTER — Ambulatory Visit (INDEPENDENT_AMBULATORY_CARE_PROVIDER_SITE_OTHER): Payer: Medicare Other | Admitting: Family Medicine

## 2018-11-18 VITALS — BP 124/79 | HR 71 | Temp 98.2°F | Resp 16 | Ht 64.0 in | Wt 195.0 lb

## 2018-11-18 DIAGNOSIS — R350 Frequency of micturition: Secondary | ICD-10-CM

## 2018-11-18 DIAGNOSIS — E538 Deficiency of other specified B group vitamins: Secondary | ICD-10-CM

## 2018-11-18 DIAGNOSIS — N3090 Cystitis, unspecified without hematuria: Secondary | ICD-10-CM | POA: Diagnosis not present

## 2018-11-18 LAB — POCT URINALYSIS DIPSTICK
Bilirubin, UA: NEGATIVE
Glucose, UA: POSITIVE — AB
Ketones, UA: NEGATIVE
NITRITE UA: POSITIVE
PROTEIN UA: NEGATIVE
Spec Grav, UA: 1.02 (ref 1.010–1.025)
Urobilinogen, UA: 0.2 E.U./dL
pH, UA: 6 (ref 5.0–8.0)

## 2018-11-18 MED ORDER — CYANOCOBALAMIN 1000 MCG/ML IJ SOLN
1000.0000 ug | Freq: Once | INTRAMUSCULAR | Status: AC
  Administered 2018-11-18: 1000 ug via INTRAMUSCULAR

## 2018-11-18 MED ORDER — CEPHALEXIN 500 MG PO CAPS: 500.0000 mg | ORAL_CAPSULE | Freq: Two times a day (BID) | ORAL | 0 refills | Status: DC

## 2018-11-18 NOTE — Progress Notes (Signed)
Date:  11/18/2018   Name:  Brenda [name]   DOB:  22-Apr-1931   MRN:  [id number]   Chief Complaint: Urinary Frequency (feels she may have kidney infection like her friend in the hospital. ) and b12 def  Urinary Frequency   This is a new problem. The current episode started in the past 7 days (week or more). The problem occurs every urination. The problem has been unchanged. The patient is experiencing no pain. There has been no fever. Associated symptoms include chills and frequency. Pertinent negatives include no discharge, flank pain, hematuria, hesitancy, nausea, sweats, urgency or vomiting. She has tried nothing for the symptoms.    Review of Systems  Constitutional: Positive for chills. Negative for fatigue, fever and unexpected weight change.  HENT: Negative for congestion, ear discharge, ear pain, rhinorrhea, sinus pressure, sneezing and sore throat.   Eyes: Negative for photophobia, pain, discharge, redness and itching.  Respiratory: Negative for cough, shortness of breath, wheezing and stridor.   Gastrointestinal: Negative for abdominal pain, blood in stool, constipation, diarrhea, nausea and vomiting.  Endocrine: Negative for cold intolerance, heat intolerance, polydipsia, polyphagia and polyuria.  Genitourinary: Positive for frequency. Negative for dysuria, flank pain, hematuria, hesitancy, menstrual problem, pelvic pain, urgency, vaginal bleeding and vaginal discharge.  Musculoskeletal: Negative for arthralgias, back pain and myalgias.  Skin: Negative for rash.  Allergic/Immunologic: Negative for environmental allergies and food allergies.  Neurological: Negative for dizziness, weakness, light-headedness, numbness and headaches.  Hematological: Negative for adenopathy. Does not bruise/bleed easily.  Psychiatric/Behavioral: Negative for dysphoric mood. The patient is not nervous/anxious.     Patient Active Problem List   Diagnosis Date Noted  . Screening for breast cancer  08/18/2015  . B12 deficiency 12/02/2014  . Lumbago 07/09/2014  . Shortness of breath 06/30/2014  . Obese 06/30/2014  . Right facial pain 05/18/2014  . Anemia 01/01/2014  . Osteoarthritis 07/31/2013  . Other malaise and fatigue 07/09/2013  . Muscle cramp, nocturnal 03/25/2013  . Medicare annual wellness visit, subsequent 12/02/2012  . Dermatitis 12/02/2012  . Other and unspecified hyperlipidemia 12/02/2012  . Essential hypertension, benign 12/02/2012  . Chronic low back pain 08/28/2012  . Osteoporosis 07/23/2012  . Paresthesia of bilateral legs 05/30/2012  . Pernicious anemia 05/30/2012  . Diabetes mellitus type 2 with complications ([place]) 66/02/3015  . Depression 05/30/2012  . Insomnia 05/30/2012  . Weakness of both legs 05/30/2012    Allergies  Allergen Reactions  . Aspirin Other (See Comments)    bleeding  . Sulfa Antibiotics Swelling  . Oxybutynin Other (See Comments)    Dry mouth    Past Surgical History:  Procedure Laterality Date  . ABDOMINAL HYSTERECTOMY    . FOOT SURGERY  2000  . SHOULDER SURGERY  2013   fracture right shoulder    Social History   Tobacco Use  . Smoking status: Never Smoker  . Smokeless tobacco: Never Used  . Tobacco comment: smoking cessation materials not required  Substance Use Topics  . Alcohol use: No  . Drug use: No     Medication list has been reviewed and updated.  Current Meds  Medication Sig  . cholecalciferol (VITAMIN D) 1000 units tablet Take 4,000 Units by mouth daily.   . cyanocobalamin (,VITAMIN B-12,) 1000 MCG/ML injection Inject 1,000 mcg into the muscle every 30 (thirty) days.  . ferrous sulfate 324 (65 Fe) MG TBEC Take 1 tablet (325 mg total) by mouth daily.  . fluticasone furoate-vilanterol (BREO ELLIPTA) 100-25 MCG/INH AEPB  Inhale 1 puff into the lungs daily.  Marland Kitchen gabapentin (NEURONTIN) 100 MG capsule TAKE 1 CAPSULE(100 MG) BY MOUTH EVERY NIGHT AT BEDTIME AS NEEDED  . glipiZIDE (GLUCOTROL XL) 10 MG 24 hr tablet  Take 1 tablet (10 mg total) by mouth 2 (two) times daily.  . hydrOXYzine (ATARAX/VISTARIL) 25 MG tablet Take 1 tablet (25 mg total) by mouth at bedtime.  Marland Kitchen lisinopril (PRINIVIL,ZESTRIL) 5 MG tablet Take 1 tablet (5 mg total) by mouth daily.  Marland Kitchen loratadine (CLARITIN) 10 MG tablet Take 1 tablet (10 mg total) by mouth daily.  . Melatonin 10 MG TABS Take 10 mg by mouth daily.  . metFORMIN (GLUCOPHAGE) 1000 MG tablet Take 1 tablet (1,000 mg total) by mouth 2 (two) times daily with a meal.  . naphazoline-pheniramine (NAPHCON-A) 0.025-0.3 % ophthalmic solution Place 1 drop into both eyes 4 (four) times daily as needed for eye irritation.  . pantoprazole (PROTONIX) 40 MG tablet Take 1 tablet (40 mg total) by mouth daily.  Marland Kitchen PROAIR HFA 108 (90 Base) MCG/ACT inhaler INHALE 2 PUFFS INTO THE LUNGS EVERY 6 HOURS AS NEEDED FOR WHEEZING OR SHORTNESS OF BREATH  . sucralfate (CARAFATE) 1 G tablet Take 1 g by mouth as needed.   . [DISCONTINUED] hydrocortisone 1 % lotion Apply 1 application topically 2 (two) times daily.  . [DISCONTINUED] solifenacin (VESICARE) 10 MG tablet Take 1 tablet (10 mg total) by mouth daily.   Current Facility-Administered Medications for the 11/18/18 encounter (Office Visit) with Juline Patch, MD  Medication  . cyanocobalamin ((VITAMIN B-12)) injection 1,000 mcg  . ipratropium-albuterol (DUONEB) 0.5-2.5 (3) MG/3ML nebulizer solution 3 mL    PHQ 2/9 Scores 02/19/2018 02/18/2017 01/23/2016 11/04/2015  PHQ - 2 Score 0 0 0 0  PHQ- 9 Score 0 - - -    Physical Exam Vitals signs and nursing note reviewed.  Constitutional:      Appearance: She is well-developed. She is obese.  HENT:     Head: Normocephalic.     Right Ear: Tympanic membrane, ear canal and external ear normal.     Left Ear: Tympanic membrane, ear canal and external ear normal.     Mouth/Throat:     Mouth: Mucous membranes are moist.  Eyes:     General: Lids are everted, no foreign bodies appreciated. No scleral icterus.        Left eye: No foreign body or hordeolum.     Conjunctiva/sclera: Conjunctivae normal.     Right eye: Right conjunctiva is not injected.     Left eye: Left conjunctiva is not injected.     Pupils: Pupils are equal, round, and reactive to light.  Neck:     Musculoskeletal: Normal range of motion and neck supple. No neck rigidity or muscular tenderness.     Thyroid: No thyromegaly.     Vascular: No carotid bruit or JVD.     Trachea: No tracheal deviation.  Cardiovascular:     Rate and Rhythm: Normal rate and regular rhythm.     Pulses: Normal pulses.     Heart sounds: Normal heart sounds. No murmur. No friction rub. No gallop.   Pulmonary:     Effort: Pulmonary effort is normal. No respiratory distress.     Breath sounds: Normal breath sounds. No stridor. No wheezing, rhonchi or rales.  Chest:     Chest wall: No tenderness.  Abdominal:     General: Bowel sounds are normal. There is no distension.     Palpations: Abdomen  is soft. There is no mass.     Tenderness: There is no abdominal tenderness. There is no right CVA tenderness, left CVA tenderness, guarding or rebound.     Hernia: No hernia is present.  Musculoskeletal: Normal range of motion.        General: No tenderness.  Lymphadenopathy:     Cervical: No cervical adenopathy.  Skin:    General: Skin is warm.     Findings: No rash.  Neurological:     Mental Status: She is alert and oriented to person, place, and time.     Cranial Nerves: No cranial nerve deficit.     Deep Tendon Reflexes: Reflexes normal.  Psychiatric:        Mood and Affect: Mood is not anxious or depressed.     Wt Readings from Last 3 Encounters:  11/18/18 195 lb (88.5 kg)  09/16/18 193 lb (87.5 kg)  07/16/18 210 lb (95.3 kg)    BP 124/79   Pulse 71   Temp 98.2 F (36.8 C) (Oral)   Resp 16   Ht 5\' 4"  (1.626 m)   Wt 195 lb (88.5 kg)   SpO2 95%   BMI 33.47 kg/m   Assessment and Plan:  1. Urine frequency Patient has approximately a 1  week history of urinary frequency.  Evaluation of urinalysis notes white blood cells and nitrates that are positive.  Will initiate cephalexin 500 mg twice a day for 3 days for suspected uncomplicated cystitis - POCT urinalysis dipstick - cephALEXin (KEFLEX) 500 MG capsule; Take 1 capsule (500 mg total) by mouth 2 (two) times daily.  Dispense: 6 capsule; Refill: 0  2. B12 deficiency Patient has a history of B12 deficiency for which she receives IM B12 injections patient went injection with no complications patient given 1000 mcg B12 IM - cyanocobalamin ((VITAMIN B-12)) injection 1,000 mcg  3. Cystitis Dipstick notes white cells and nitrates consistent with uncomplicated cystitis patient was started on Keflex 500 mg twice a day for 3 days. - cephALEXin (KEFLEX) 500 MG capsule; Take 1 capsule (500 mg total) by mouth 2 (two) times daily.  Dispense: 6 capsule; Refill: 0

## 2019-01-27 DIAGNOSIS — E669 Obesity, unspecified: Secondary | ICD-10-CM | POA: Diagnosis not present

## 2019-01-27 DIAGNOSIS — I10 Essential (primary) hypertension: Secondary | ICD-10-CM | POA: Diagnosis not present

## 2019-01-27 DIAGNOSIS — E1142 Type 2 diabetes mellitus with diabetic polyneuropathy: Secondary | ICD-10-CM | POA: Diagnosis not present

## 2019-01-27 DIAGNOSIS — E1129 Type 2 diabetes mellitus with other diabetic kidney complication: Secondary | ICD-10-CM | POA: Diagnosis not present

## 2019-01-27 DIAGNOSIS — E538 Deficiency of other specified B group vitamins: Secondary | ICD-10-CM | POA: Diagnosis not present

## 2019-01-27 DIAGNOSIS — E1159 Type 2 diabetes mellitus with other circulatory complications: Secondary | ICD-10-CM | POA: Diagnosis not present

## 2019-01-27 DIAGNOSIS — E559 Vitamin D deficiency, unspecified: Secondary | ICD-10-CM | POA: Diagnosis not present

## 2019-01-27 DIAGNOSIS — E1165 Type 2 diabetes mellitus with hyperglycemia: Secondary | ICD-10-CM | POA: Diagnosis not present

## 2019-01-27 DIAGNOSIS — R809 Proteinuria, unspecified: Secondary | ICD-10-CM | POA: Diagnosis not present

## 2019-02-17 ENCOUNTER — Ambulatory Visit (INDEPENDENT_AMBULATORY_CARE_PROVIDER_SITE_OTHER): Payer: Medicare Other

## 2019-02-17 ENCOUNTER — Other Ambulatory Visit: Payer: Self-pay

## 2019-02-17 DIAGNOSIS — E538 Deficiency of other specified B group vitamins: Secondary | ICD-10-CM | POA: Diagnosis not present

## 2019-02-17 MED ORDER — CYANOCOBALAMIN 1000 MCG/ML IJ SOLN
1000.0000 ug | Freq: Once | INTRAMUSCULAR | Status: AC
  Administered 2019-02-17: 1000 ug via INTRAMUSCULAR

## 2019-02-23 ENCOUNTER — Ambulatory Visit: Payer: Medicare Other

## 2019-03-02 ENCOUNTER — Ambulatory Visit: Payer: Medicare Other

## 2019-03-09 ENCOUNTER — Ambulatory Visit (INDEPENDENT_AMBULATORY_CARE_PROVIDER_SITE_OTHER): Payer: Medicare Other

## 2019-03-09 VITALS — BP 129/69 | HR 62 | Temp 97.1°F | Ht 64.0 in | Wt 194.0 lb

## 2019-03-09 DIAGNOSIS — Z0000 Encounter for general adult medical examination without abnormal findings: Secondary | ICD-10-CM | POA: Diagnosis not present

## 2019-03-09 DIAGNOSIS — Z1231 Encounter for screening mammogram for malignant neoplasm of breast: Secondary | ICD-10-CM | POA: Diagnosis not present

## 2019-03-09 DIAGNOSIS — E1159 Type 2 diabetes mellitus with other circulatory complications: Secondary | ICD-10-CM | POA: Insufficient documentation

## 2019-03-09 NOTE — Progress Notes (Signed)
Subjective:   Brenda [name] is a 85 y.o. female who presents for Medicare Annual (Subsequent) preventive examination.  Virtual Visit via Telephone Note  I connected with Brenda [name] on 03/09/19 at  8:00 AM EDT by telephone and verified that I am speaking with the correct person using two identifiers.  Medicare Annual Wellness visit completed telephonically due to Covid-19 pandemic.   Location: Patient: home Provider: office   I discussed the limitations, risks, security and privacy concerns of performing an evaluation and management service by telephone and the availability of in person appointments. The patient expressed understanding and agreed to proceed.  Some vital signs may be absent or patient reported.   Clemetine Marker, LPN  Review of Systems:   Cardiac Risk Factors include: advanced age (>46men, >56 women);diabetes mellitus;hypertension;obesity (BMI >30kg/m2)     Objective:     Vitals: BP 129/69   Pulse 62   Temp (!) 97.1 F (36.2 C) (Oral)   Ht 5\' 4"  (1.626 m)   Wt 194 lb (88 kg)   SpO2 95%   BMI 33.30 kg/m   Body mass index is 33.3 kg/m.  Advanced Directives 03/09/2019 07/16/2018 02/19/2018 02/18/2017 11/30/2016 05/26/2015  Does Patient Have a Medical Advance Directive? Yes No No No No Yes  Type of Paramedic of [place];Living will - - - - Living will  Copy of [place] in Chart? No - copy requested - - - - No - copy requested  Would patient like information on creating a medical advance directive? - - Yes (MAU/Ambulatory/Procedural Areas - Information given) Yes (MAU/Ambulatory/Procedural Areas - Information given) - -    Tobacco Social History   Tobacco Use  Smoking Status Never Smoker  Smokeless Tobacco Never Used  Tobacco Comment   smoking cessation materials not required     Counseling given: Not Answered Comment: smoking cessation materials not required   Clinical Intake:  Pre-visit preparation  completed: Yes  Pain : 0-10 Pain Score: 2  Pain Type: Chronic pain Pain Location: Back Pain Descriptors / Indicators: Aching, Discomfort Pain Onset: More than a month ago Pain Frequency: Intermittent     BMI - recorded: 33.3 Nutritional Status: BMI > 30  Obese Nutritional Risks: None Diabetes: Yes CBG done?: No Did pt. bring in CBG monitor from home?: No   Nutrition Risk Assessment:  Has the patient had any N/V/D within the last 2 months?  No Does the patient have any non-healing wounds?  No Has the patient had any unintentional weight loss or weight gain?  No   Diabetes:  Is the patient diabetic?  Yes  If diabetic, was a CBG obtained today?  No  Did the patient bring in their glucometer from home?  No  How often do you monitor your CBG's? 2-3 times daily.   Financial Strains and Diabetes Management:  Are you having any financial strains with the device, your supplies or your medication? No .  Does the patient want to be seen by Chronic Care Management for management of their diabetes?  No  Would the patient like to be referred to a Nutritionist or for Diabetic Management?  No   Diabetic Exams:  Diabetic Eye Exam: Completed 06/13/18 negative retinopathy.   Diabetic Foot Exam: Completed 02/21/18. Pt has been advised about the importance in completing this exam.  How often do you need to have someone help you when you read instructions, pamphlets, or other written materials from your doctor or pharmacy?:  1 - Never  Interpreter Needed?: No  Information entered by :: Clemetine Marker LPN  Past Medical History:  Diagnosis Date  . Arthritis   . Blood in stool   . Cancer ([place])   . Chicken pox   . Diabetes mellitus 2000  . Dry skin   . GERD (gastroesophageal reflux disease)   . Hiatal hernia   . Hypertension   . Kidney infection 2014  . Ovarian cancer ([place]) 1991   s/p hysterectomy at [facility]  . Pernicious anemia   . Spinal stenosis   . Ulcer   . Urinary incontinence     Past Surgical History:  Procedure Laterality Date  . ABDOMINAL HYSTERECTOMY    . FOOT SURGERY  2000  . SHOULDER SURGERY  2013   fracture right shoulder   Family History  Problem Relation Age of Onset  . Arthritis Mother   . Cancer Mother        colon  . Arthritis Father   . Cancer Father        colon  . Cancer Sister        lung  . Cancer Other        lung   Social History   Socioeconomic History  . Marital status: Widowed    Spouse name: Not on file  . Number of children: 2  . Years of education: Not on file  . Highest education level: 9th grade  Occupational History  . Occupation: Retired  Scientific laboratory technician  . Financial resource strain: Not very hard  . Food insecurity    Worry: Never true    Inability: Never true  . Transportation needs    Medical: No    Non-medical: No  Tobacco Use  . Smoking status: Never Smoker  . Smokeless tobacco: Never Used  . Tobacco comment: smoking cessation materials not required  Substance and Sexual Activity  . Alcohol use: No  . Drug use: No  . Sexual activity: Never  Lifestyle  . Physical activity    Days per week: 0 days    Minutes per session: 0 min  . Stress: To some extent  Relationships  . Social Herbalist on phone: Patient refused    Gets together: Three times a week    Attends religious service: More than 4 times per year    Active member of club or organization: No    Attends meetings of clubs or organizations: Never    Relationship status: Widowed  Other Topics Concern  . Not on file  Social History Narrative   Lives alone in [place]. 1 daughter living in [place]. Son died lung cancer.    Outpatient Encounter Medications as of 03/09/2019  Medication Sig  . ACCU-CHEK AVIVA PLUS test strip USE 1 STRIP TO CHECK GLUCOSE THREE TIMES DAILY  . Accu-Chek Softclix Lancets lancets 3 (three) times daily. Use as instructed. E11.42  . cholecalciferol (VITAMIN D) 1000 units tablet Take 4,000 Units by mouth daily.    . cyanocobalamin (,VITAMIN B-12,) 1000 MCG/ML injection Inject 1,000 mcg into the muscle every 30 (thirty) days.  . ferrous sulfate 324 (65 Fe) MG TBEC Take 1 tablet (325 mg total) by mouth daily.  Marland Kitchen gabapentin (NEURONTIN) 100 MG capsule TAKE 1 CAPSULE(100 MG) BY MOUTH EVERY NIGHT AT BEDTIME AS NEEDED (Patient taking differently: Takes very rarely)  . glipiZIDE (GLUCOTROL) 10 MG tablet Take 1 tablet by mouth 2 (two) times a day.  . lisinopril (PRINIVIL,ZESTRIL) 5 MG tablet Take  1 tablet (5 mg total) by mouth daily.  Marland Kitchen loratadine (CLARITIN) 10 MG tablet Take 1 tablet (10 mg total) by mouth daily.  . metFORMIN (GLUCOPHAGE) 1000 MG tablet Take 1 tablet (1,000 mg total) by mouth 2 (two) times daily with a meal.  . mometasone (ELOCON) 0.1 % lotion APP TOPICALLY TO SKIN ON SCALP ONCE D  . pantoprazole (PROTONIX) 40 MG tablet Take 1 tablet (40 mg total) by mouth daily.  . pioglitazone (ACTOS) 15 MG tablet Take 1 tablet by mouth daily.  Marland Kitchen PROAIR HFA 108 (90 Base) MCG/ACT inhaler INHALE 2 PUFFS INTO THE LUNGS EVERY 6 HOURS AS NEEDED FOR WHEEZING OR SHORTNESS OF BREATH  . sucralfate (CARAFATE) 1 G tablet Take 1 g by mouth as needed.   . [DISCONTINUED] cephALEXin (KEFLEX) 500 MG capsule Take 1 capsule (500 mg total) by mouth 2 (two) times daily.  . [DISCONTINUED] fluticasone furoate-vilanterol (BREO ELLIPTA) 100-25 MCG/INH AEPB Inhale 1 puff into the lungs daily.  . [DISCONTINUED] glipiZIDE (GLUCOTROL XL) 10 MG 24 hr tablet Take 1 tablet (10 mg total) by mouth 2 (two) times daily.  . [DISCONTINUED] hydrOXYzine (ATARAX/VISTARIL) 25 MG tablet Take 1 tablet (25 mg total) by mouth at bedtime.  . [DISCONTINUED] Melatonin 10 MG TABS Take 10 mg by mouth daily.  . [DISCONTINUED] naphazoline-pheniramine (NAPHCON-A) 0.025-0.3 % ophthalmic solution Place 1 drop into both eyes 4 (four) times daily as needed for eye irritation.   Facility-Administered Encounter Medications as of 03/09/2019  Medication  .  ipratropium-albuterol (DUONEB) 0.5-2.5 (3) MG/3ML nebulizer solution 3 mL    Activities of Daily Living In your present state of health, do you have any difficulty performing the following activities: 03/09/2019  Hearing? Y  Vision? Y  Difficulty concentrating or making decisions? N  Walking or climbing stairs? Y  Dressing or bathing? N  Doing errands, shopping? N  Preparing Food and eating ? N  Using the Toilet? N  In the past six months, have you accidently leaked urine? Y  Comment wears depends  Do you have problems with loss of bowel control? N  Managing your Medications? N  Managing your Finances? N  Housekeeping or managing your Housekeeping? N  Some recent data might be hidden    Patient Care Team: Juline Patch, MD as PCP - General (Family Medicine) Leandrew Koyanagi, MD as Consulting Physician (Ophthalmology)    Assessment:   This is a routine wellness examination for Brenda [name].  Exercise Activities and Dietary recommendations Current Exercise Habits: The patient does not participate in regular exercise at present, Exercise limited by: orthopedic condition(s)  Goals    . DIET - INCREASE WATER INTAKE     Recommend to drink at least 6-8 8oz glasses of water per day.    . Exercise 150 minutes per week (moderate activity)     Recommend increasing exercise to help promote better sleep       Fall Risk Fall Risk  03/09/2019 02/19/2018 02/18/2017 01/23/2016 11/04/2015  Falls in the past year? 0 No No No No  Number falls in past yr: 0 - - - -  Injury with Fall? 0 - - - -  Risk Factor Category  - - - - -  Risk for fall due to : Impaired balance/gait;Impaired vision Impaired vision;History of fall(s);Impaired balance/gait;Medication side effect - - -  Risk for fall due to: Comment - wears eyeglasses; ambulates with cane; weakness - - -  Follow up Falls prevention discussed - - - -  FALL RISK PREVENTION PERTAINING TO THE HOME:  Any stairs in or around the home? Yes  If  so, do they handrails? Yes   Home free of loose throw rugs in walkways, pet beds, electrical cords, etc? Yes  Adequate lighting in your home to reduce risk of falls? Yes   ASSISTIVE DEVICES UTILIZED TO PREVENT FALLS:  Life alert? No  Use of a cane, walker or w/c? Yes  Grab bars in the bathroom? No  Shower chair or bench in shower? Yes  Elevated toilet seat or a handicapped toilet? Yes   DME ORDERS:  DME order needed?  No   TIMED UP AND GO:  Was the test performed? No . Telephonic visit.  Education: Fall risk prevention has been discussed.  Intervention(s) required? No   Depression Screen PHQ 2/9 Scores 03/09/2019 02/19/2018 02/18/2017 01/23/2016  PHQ - 2 Score 2 0 0 0  PHQ- 9 Score 6 0 - -     Cognitive Function     6CIT Screen 03/09/2019 02/19/2018 02/18/2017  What Year? 0 points 0 points 0 points  What month? 0 points 0 points 0 points  What time? 0 points 0 points 0 points  Count back from 20 0 points 0 points 0 points  Months in reverse 2 points 0 points 0 points  Repeat phrase 4 points 0 points 0 points  Total Score 6 0 0    Immunization History  Administered Date(s) Administered  . Influenza Split 06/18/2012, 06/18/2015, 06/15/2016  . Influenza, High Dose Seasonal PF 06/24/2017  . Influenza,inj,Quad PF,6+ Mos 06/25/2013, 07/09/2014  . Pneumococcal Conjugate-13 01/01/2014  . Pneumococcal-Unspecified 08/29/2011    Qualifies for Shingles Vaccine? Yes . Due for Shingrix. Education has been provided regarding the importance of this vaccine. Pt has been advised to call insurance company to determine out of pocket expense. Advised may also receive vaccine at local pharmacy or Health Dept. Verbalized acceptance and understanding.  Tdap: Although this vaccine is not a covered service during a Wellness Exam, does the patient still wish to receive this vaccine today?  No .  Education has been provided regarding the importance of this vaccine. Advised may receive this  vaccine at local pharmacy or Health Dept. Aware to provide a copy of the vaccination record if obtained from local pharmacy or Health Dept. Verbalized acceptance and understanding.  Flu Vaccine: Up to date  Pneumococcal Vaccine: Up to date   Screening Tests Health Maintenance  Topic Date Due  . TETANUS/TDAP  07/23/2017  . HEMOGLOBIN A1C  01/20/2019  . FOOT EXAM  02/22/2019  . INFLUENZA VACCINE  04/11/2019  . OPHTHALMOLOGY EXAM  06/14/2019  . DEXA SCAN  Completed    Cancer Screenings:  Colorectal Screening: No longer required.   Mammogram: Completed 09/28/15. Repeat every year; Ordered today. Pt provided with contact information and advised to call to schedule appt.   Bone Density: Completed 06/24/12. Results reflect OSTEOPENIA. Repeat every 2 years. Pt declines repeat screening at this time.    Lung Cancer Screening: (Low Dose CT Chest recommended if Age 59-80 years, 30 pack-year currently smoking OR have quit w/in 15years.) does not qualify.    Additional Screening:  Hepatitis C Screening: no longer required  Vision Screening: Recommended annual ophthalmology exams for early detection of glaucoma and other disorders of the eye. Is the patient up to date with their annual eye exam?  Yes  Who is the provider or what is the name of the office in which the pt attends  annual eye exams? [place] Screening: Recommended annual dental exams for proper oral hygiene  Community Resource Referral:  CRR required this visit?  No      Plan:     I have personally reviewed and addressed the Medicare Annual Wellness questionnaire and have noted the following in the patient's chart:  A. Medical and social history B. Use of alcohol, tobacco or illicit drugs  C. Current medications and supplements D. Functional ability and status E.  Nutritional status F.  Physical activity G. Advance directives H. List of other physicians I.  Hospitalizations, surgeries, and ER  visits in previous 12 months J.  [place] such as hearing and vision if needed, cognitive and depression L. Referrals and appointments   In addition, I have reviewed and discussed with patient certain preventive protocols, quality metrics, and best practice recommendations. A written personalized care plan for preventive services as well as general preventive health recommendations were provided to patient.   Signed,  Clemetine Marker, LPN Nurse Health Advisor   Nurse Notes: none

## 2019-03-09 NOTE — Patient Instructions (Signed)
Brenda [name] , Thank you for taking time to come for your Medicare Wellness Visit. I appreciate your ongoing commitment to your health goals. Please review the following plan we discussed and let me know if I can assist you in the future.   Screening recommendations/referrals: Colonoscopy: no longer required  Mammogram: 09/28/15. Please call [phone] to schedule your mammogram.  Bone Density: done 06/24/12 Recommended yearly ophthalmology/optometry visit for glaucoma screening and checkup Recommended yearly dental visit for hygiene and checkup  Vaccinations: Influenza vaccine: done 09/24/18 Pneumococcal vaccine: done 01/01/14 Tdap vaccine: due - please contact us if you get a cut or scrape Shingles vaccine: Shingrix discussed. Please contact your pharmacy for coverage information.   Advanced directives: Please bring a copy of your health care power of attorney and living will to the office at your convenience.  Conditions/risks identified: recommend healthy eating and increase physical activity  Next appointment: Please follow up in one year for your Medicare Annual Wellness visit.     Preventive Care 30 Years and Older, Female Preventive care refers to lifestyle choices and visits with your health care provider that can promote health and wellness. What does preventive care include?  A yearly physical exam. This is also called an annual well check.  Dental exams once or twice a year.  Routine eye exams. Ask your health care provider how often you should have your eyes checked.  Personal lifestyle choices, including:  Daily care of your teeth and gums.  Regular physical activity.  Eating a healthy diet.  Avoiding tobacco and drug use.  Limiting alcohol use.  Practicing safe sex.  Taking low-dose aspirin every day.  Taking vitamin and mineral supplements as recommended by your health care provider. What happens during an annual well check? The services and screenings  done by your health care provider during your annual well check will depend on your age, overall health, lifestyle risk factors, and family history of disease. Counseling  Your health care provider may ask you questions about your:  Alcohol use.  Tobacco use.  Drug use.  Emotional well-being.  Home and relationship well-being.  Sexual activity.  Eating habits.  History of falls.  Memory and ability to understand (cognition).  Work and work Statistician.  Reproductive health. Screening  You may have the following tests or measurements:  Height, weight, and BMI.  Blood pressure.  Lipid and cholesterol levels. These may be checked every 5 years, or more frequently if you are over 49 years old.  Skin check.  Lung cancer screening. You may have this screening every year starting at age 59 if you have a 30-pack-year history of smoking and currently smoke or have quit within the past 15 years.  Fecal occult blood test (FOBT) of the stool. You may have this test every year starting at age 20.  Flexible sigmoidoscopy or colonoscopy. You may have a sigmoidoscopy every 5 years or a colonoscopy every 10 years starting at age 79.  Hepatitis C blood test.  Hepatitis B blood test.  Sexually transmitted disease (STD) testing.  Diabetes screening. This is done by checking your blood sugar (glucose) after you have not eaten for a while (fasting). You may have this done every 1-3 years.  Bone density scan. This is done to screen for osteoporosis. You may have this done starting at age 53.  Mammogram. This may be done every 1-2 years. Talk to your health care provider about how often you should have regular mammograms. Talk with your health care  provider about your test results, treatment options, and if necessary, the need for more tests. Vaccines  Your health care provider may recommend certain vaccines, such as:  Influenza vaccine. This is recommended every year.  Tetanus,  diphtheria, and acellular pertussis (Tdap, Td) vaccine. You may need a Td booster every 10 years.  Zoster vaccine. You may need this after age 22.  Pneumococcal 13-valent conjugate (PCV13) vaccine. One dose is recommended after age 18.  Pneumococcal polysaccharide (PPSV23) vaccine. One dose is recommended after age 72. Talk to your health care provider about which screenings and vaccines you need and how often you need them. This information is not intended to replace advice given to you by your health care provider. Make sure you discuss any questions you have with your health care provider. Document Released: 09/23/2015 Document Revised: 05/16/2016 Document Reviewed: 06/28/2015 Elsevier Interactive Patient Education  2017 [place] Prevention in the Home Falls can cause injuries. They can happen to people of all ages. There are many things you can do to make your home safe and to help prevent falls. What can I do on the outside of my home?  Regularly fix the edges of walkways and driveways and fix any cracks.  Remove anything that might make you trip as you walk through a door, such as a raised step or threshold.  Trim any bushes or trees on the path to your home.  Use bright outdoor lighting.  Clear any walking paths of anything that might make someone trip, such as rocks or tools.  Regularly check to see if handrails are loose or broken. Make sure that both sides of any steps have handrails.  Any raised decks and porches should have guardrails on the edges.  Have any leaves, snow, or ice cleared regularly.  Use sand or salt on walking paths during winter.  Clean up any spills in your garage right away. This includes oil or grease spills. What can I do in the bathroom?  Use night lights.  Install grab bars by the toilet and in the tub and shower. Do not use towel bars as grab bars.  Use non-skid mats or decals in the tub or shower.  If you need to sit down in  the shower, use a plastic, non-slip stool.  Keep the floor dry. Clean up any water that spills on the floor as soon as it happens.  Remove soap buildup in the tub or shower regularly.  Attach bath mats securely with double-sided non-slip rug tape.  Do not have throw rugs and other things on the floor that can make you trip. What can I do in the bedroom?  Use night lights.  Make sure that you have a light by your bed that is easy to reach.  Do not use any sheets or blankets that are too big for your bed. They should not hang down onto the floor.  Have a firm chair that has side arms. You can use this for support while you get dressed.  Do not have throw rugs and other things on the floor that can make you trip. What can I do in the kitchen?  Clean up any spills right away.  Avoid walking on wet floors.  Keep items that you use a lot in easy-to-reach places.  If you need to reach something above you, use a strong step stool that has a grab bar.  Keep electrical cords out of the way.  Do not use floor polish  or wax that makes floors slippery. If you must use wax, use non-skid floor wax.  Do not have throw rugs and other things on the floor that can make you trip. What can I do with my stairs?  Do not leave any items on the stairs.  Make sure that there are handrails on both sides of the stairs and use them. Fix handrails that are broken or loose. Make sure that handrails are as long as the stairways.  Check any carpeting to make sure that it is firmly attached to the stairs. Fix any carpet that is loose or worn.  Avoid having throw rugs at the top or bottom of the stairs. If you do have throw rugs, attach them to the floor with carpet tape.  Make sure that you have a light switch at the top of the stairs and the bottom of the stairs. If you do not have them, ask someone to add them for you. What else can I do to help prevent falls?  Wear shoes that:  Do not have high  heels.  Have rubber bottoms.  Are comfortable and fit you well.  Are closed at the toe. Do not wear sandals.  If you use a stepladder:  Make sure that it is fully opened. Do not climb a closed stepladder.  Make sure that both sides of the stepladder are locked into place.  Ask someone to hold it for you, if possible.  Clearly mark and make sure that you can see:  Any grab bars or handrails.  First and last steps.  Where the edge of each step is.  Use tools that help you move around (mobility aids) if they are needed. These include:  Canes.  Walkers.  Scooters.  Crutches.  Turn on the lights when you go into a dark area. Replace any light bulbs as soon as they burn out.  Set up your furniture so you have a clear path. Avoid moving your furniture around.  If any of your floors are uneven, fix them.  If there are any pets around you, be aware of where they are.  Review your medicines with your doctor. Some medicines can make you feel dizzy. This can increase your chance of falling. Ask your doctor what other things that you can do to help prevent falls. This information is not intended to replace advice given to you by your health care provider. Make sure you discuss any questions you have with your health care provider. Document Released: 06/23/2009 Document Revised: 02/02/2016 Document Reviewed: 10/01/2014 Elsevier Interactive Patient Education  2017 [employer].

## 2019-03-11 ENCOUNTER — Encounter: Payer: Self-pay | Admitting: Emergency Medicine

## 2019-03-11 ENCOUNTER — Other Ambulatory Visit: Payer: Self-pay

## 2019-03-11 ENCOUNTER — Ambulatory Visit
Admission: EM | Admit: 2019-03-11 | Discharge: 2019-03-11 | Disposition: A | Discharge status: Home / Self Care | Payer: Medicare Other | Attending: Emergency Medicine | Admitting: Emergency Medicine

## 2019-03-11 DIAGNOSIS — N390 Urinary tract infection, site not specified: Secondary | ICD-10-CM | POA: Diagnosis not present

## 2019-03-11 LAB — URINALYSIS, COMPLETE (UACMP) WITH MICROSCOPIC
Bilirubin Urine: NEGATIVE
Glucose, UA: NEGATIVE mg/dL
Hgb urine dipstick: NEGATIVE
Nitrite: POSITIVE — AB
Protein, ur: NEGATIVE mg/dL
RBC / HPF: NONE SEEN RBC/hpf (ref 0–5)
Specific Gravity, Urine: 1.025 (ref 1.005–1.030)
Squamous Epithelial / HPF: NONE SEEN (ref 0–5)
pH: 5 (ref 5.0–8.0)

## 2019-03-11 MED ORDER — CEPHALEXIN 500 MG PO CAPS: 500.0000 mg | ORAL_CAPSULE | Freq: Two times a day (BID) | ORAL | 0 refills | Status: DC

## 2019-03-11 MED ORDER — CEPHALEXIN 500 MG PO CAPS: 500.0000 mg | ORAL_CAPSULE | Freq: Two times a day (BID) | ORAL | 0 refills | Status: AC

## 2019-03-11 NOTE — Discharge Instructions (Addendum)
Take medication as prescribed. Rest. Drink plenty of fluids. Monitor.  ° °Follow up with your primary care physician this week as needed. Return to Urgent care for new or worsening concerns.  ° °

## 2019-03-11 NOTE — ED Triage Notes (Signed)
Patient c/o increase in urinary urgency and frequency for the past 3 days. Patient reports lower back pain 2 days ago.

## 2019-03-11 NOTE — ED Provider Notes (Signed)
[facility] ____________________________________________  Time seen: Approximately 12:11 PM  I have reviewed the triage vital signs and the nursing notes.   HISTORY  Chief Complaint Urinary Frequency   HPI Brenda [name] is a 85 y.o. female presenting for evaluation of urinary frequency, urinary urgency and discomfort with urination present for the last 3 days.  Reports some low aching back pain present 2 days ago, but reports back pain has resolved.  Continues eat and drink well.  Denies fevers.  Denies recent cough, chest pain, shortness of breath, sore throat or fevers.  History of similar with UTIs.  Denies recent antibiotic use.  Denies aggravating or alleviating factors.  Reports otherwise doing well.  Juline Patch, MD: PCP    Past Medical History:  Diagnosis Date  . Arthritis   . Blood in stool   . Cancer ([place])   . Chicken pox   . Diabetes mellitus 2000  . Dry skin   . GERD (gastroesophageal reflux disease)   . Hiatal hernia   . Hypertension   . Kidney infection 2014  . Ovarian cancer ([place]) 1991   s/p hysterectomy at [facility]  . Pernicious anemia   . Spinal stenosis   . Ulcer   . Urinary incontinence     Patient Active Problem List   Diagnosis Date Noted  . Hypertension associated with type 2 diabetes mellitus ([place]) 03/09/2019  . Diabetic peripheral neuropathy associated with type 2 diabetes mellitus ([place]) 07/25/2018  . Persistent microalbuminuria associated with type 2 diabetes mellitus ([place]) 07/22/2018  . Screening for breast cancer 08/18/2015  . B12 deficiency 12/02/2014  . Lumbago 07/09/2014  . Shortness of breath 06/30/2014  . Obese 06/30/2014  . Microalbuminuria 06/10/2014  . Right facial pain 05/18/2014  . Anemia 01/01/2014  . Osteoarthritis 07/31/2013  . Other malaise and fatigue 07/09/2013  . Muscle cramp, nocturnal 03/25/2013  . Increased frequency of urination 02/09/2013  . Urge incontinence 02/09/2013  . Medicare annual wellness  visit, subsequent 12/02/2012  . Dermatitis 12/02/2012  . Other and unspecified hyperlipidemia 12/02/2012  . Essential hypertension, benign 12/02/2012  . Chronic low back pain 08/28/2012  . Osteoporosis 07/23/2012  . Nocturia 06/26/2012  . Paresthesia of bilateral legs 05/30/2012  . Pernicious anemia 05/30/2012  . Type 2 diabetes mellitus with hyperglycemia, without long-term current use of insulin ([place]) 05/30/2012  . Depression 05/30/2012  . Insomnia 05/30/2012  . Weakness of both legs 05/30/2012    Past Surgical History:  Procedure Laterality Date  . ABDOMINAL HYSTERECTOMY    . FOOT SURGERY  2000  . SHOULDER SURGERY  2013   fracture right shoulder      Current Facility-Administered Medications:  .  ipratropium-albuterol (DUONEB) 0.5-2.5 (3) MG/3ML nebulizer solution 3 mL, 3 mL, Nebulization, Once, Jones, Deanna C, MD  Current Outpatient Medications:  .  cholecalciferol (VITAMIN D) 1000 units tablet, Take 4,000 Units by mouth daily. , Disp: , Rfl:  .  cyanocobalamin (,VITAMIN B-12,) 1000 MCG/ML injection, Inject 1,000 mcg into the muscle every 30 (thirty) days., Disp: , Rfl:  .  ferrous sulfate 324 (65 Fe) MG TBEC, Take 1 tablet (325 mg total) by mouth daily., Disp: 90 tablet, Rfl: 1 .  gabapentin (NEURONTIN) 100 MG capsule, TAKE 1 CAPSULE(100 MG) BY MOUTH EVERY NIGHT AT BEDTIME AS NEEDED (Patient taking differently: Takes very rarely), Disp: 90 capsule, Rfl: 0 .  glipiZIDE (GLUCOTROL) 10 MG tablet, Take 1 tablet by mouth 2 (two) times a day., Disp: , Rfl:  .  lisinopril (PRINIVIL,ZESTRIL) 5 MG tablet, Take 1 tablet (5 mg total) by mouth daily., Disp: 90 tablet, Rfl: 3 .  loratadine (CLARITIN) 10 MG tablet, Take 1 tablet (10 mg total) by mouth daily., Disp: 30 tablet, Rfl: 11 .  metFORMIN (GLUCOPHAGE) 1000 MG tablet, Take 1 tablet (1,000 mg total) by mouth 2 (two) times daily with a meal., Disp: 180 tablet, Rfl: 1 .  pantoprazole (PROTONIX) 40 MG tablet, Take 1 tablet (40 mg  total) by mouth daily., Disp: 90 tablet, Rfl: 1 .  pioglitazone (ACTOS) 15 MG tablet, Take 1 tablet by mouth daily., Disp: , Rfl:  .  PROAIR HFA 108 (90 Base) MCG/ACT inhaler, INHALE 2 PUFFS INTO THE LUNGS EVERY 6 HOURS AS NEEDED FOR WHEEZING OR SHORTNESS OF BREATH, Disp: 8.5 g, Rfl: 1 .  ACCU-CHEK AVIVA PLUS test strip, USE 1 STRIP TO CHECK GLUCOSE THREE TIMES DAILY, Disp: , Rfl:  .  Accu-Chek Softclix Lancets lancets, 3 (three) times daily. Use as instructed. E11.42, Disp: , Rfl:  .  cephALEXin (KEFLEX) 500 MG capsule, Take 1 capsule (500 mg total) by mouth 2 (two) times daily for 7 days., Disp: 14 capsule, Rfl: 0 .  mometasone (ELOCON) 0.1 % lotion, APP TOPICALLY TO SKIN ON SCALP ONCE D, Disp: , Rfl:  .  sucralfate (CARAFATE) 1 G tablet, Take 1 g by mouth as needed. , Disp: , Rfl:   Allergies Aspirin, Sulfa antibiotics, and Oxybutynin  Family History  Problem Relation Age of Onset  . Arthritis Mother   . Cancer Mother        colon  . Arthritis Father   . Cancer Father        colon  . Cancer Sister        lung  . Cancer Other        lung    Social History Social History   Tobacco Use  . Smoking status: Never Smoker  . Smokeless tobacco: Never Used  . Tobacco comment: smoking cessation materials not required  Substance Use Topics  . Alcohol use: No  . Drug use: No    Review of Systems Constitutional: No fever ENT: No sore throat. Cardiovascular: Denies chest pain. Respiratory: Denies shortness of breath. Gastrointestinal: No abdominal pain.  No nausea, no vomiting.  No diarrhea.   Genitourinary: Positive for dysuria. Musculoskeletal: Positive for back pain. Skin: Negative for rash.   ____________________________________________   PHYSICAL EXAM:  VITAL SIGNS: ED Triage Vitals  Enc Vitals Group     BP 03/11/19 1148 138/69     Pulse --      Resp 03/11/19 1148 16     Temp 03/11/19 1148 98.7 F (37.1 C)     Temp Source 03/11/19 1148 Oral     SpO2 03/11/19  1148 97 %     Weight 03/11/19 1144 194 lb (88 kg)     Height 03/11/19 1144 5\' 5"  (1.651 m)     Head Circumference --      Peak Flow --      Pain Score 03/11/19 1144 0     Pain Loc --      Pain Edu? --      Excl. in [place]? --     Constitutional: Alert and oriented. Well appearing and in no acute distress. ENT      Head: Normocephalic and atraumatic. Cardiovascular: Normal rate, regular rhythm. Grossly normal heart sounds.  Good peripheral circulation. Respiratory: Normal respiratory effort without tachypnea nor retractions. Breath sounds are clear  and equal bilaterally. No wheezes, rales, rhonchi. Gastrointestinal: Soft and nontender. No CVA tenderness. Musculoskeletal:   No midline cervical, thoracic or lumbar tenderness to palpation. Neurologic:  Normal speech and language. Speech is normal. No gait instability.  Skin:  Skin is warm, dry and intact. No rash noted. Psychiatric: Mood and affect are normal. Speech and behavior are normal. Patient exhibits appropriate insight and judgment   ___________________________________________   LABS (all labs ordered are listed, but only abnormal results are displayed)  Labs Reviewed  URINALYSIS, COMPLETE (UACMP) WITH MICROSCOPIC - Abnormal; Notable for the following components:      Result Value   Ketones, ur TRACE (*)    Nitrite POSITIVE (*)    Leukocytes,Ua TRACE (*)    Bacteria, UA MANY (*)    All other components within normal limits  URINE CULTURE    Via care everywhere most recent labs reviewed from 10/24/2018 with a creatinine of 1.1.  Creatinine clearance calculated at 52.  PROCEDURES Procedures   ___________________________________________   INITIAL IMPRESSION / ASSESSMENT AND PLAN / ED COURSE  Pertinent labs & imaging results that were available during my care of the patient were reviewed by me and considered in my medical decision making (see chart for details).  Well-appearing patient.  No acute distress.  Urinalysis  reviewed, UTI.  We will culture urine and empirically treat with oral Keflex.  Encourage fluids, supportive care and monitoring.Discussed indication, risks and benefits of medications with patient.  Discussed follow up with Primary care physician this week. Discussed follow up and return parameters including no resolution or any worsening concerns. Patient verbalized understanding and agreed to plan.   ____________________________________________   FINAL CLINICAL IMPRESSION(S) / ED DIAGNOSES  Final diagnoses:  Urinary tract infection without hematuria, site unspecified     ED Discharge Orders         Ordered    cephALEXin (KEFLEX) 500 MG capsule  2 times daily     03/11/19 1234           Note: This dictation was prepared with Dragon dictation along with smaller phrase technology. Any transcriptional errors that result from this process are unintentional.         Marylene Land, NP 03/11/19 1335

## 2019-03-13 ENCOUNTER — Telehealth (HOSPITAL_COMMUNITY): Payer: Self-pay | Admitting: Emergency Medicine

## 2019-03-13 ENCOUNTER — Telehealth: Payer: Self-pay

## 2019-03-13 LAB — URINE CULTURE: Culture: >=100000 — AB

## 2019-03-13 MED ORDER — FOSFOMYCIN TROMETHAMINE 3 G PO PACK: 3.0000 g | PACK | Freq: Once | ORAL | 0 refills | Status: AC

## 2019-03-13 NOTE — Telephone Encounter (Signed)
Patient called in today and states that medication that was sent in for her is not covered by insurance and would like to know what to do now? Thanks.

## 2019-03-13 NOTE — Telephone Encounter (Signed)
Per Max Sane NP to send in fosfomycin 3g once and have pt follow up with PCP asap.

## 2019-03-16 ENCOUNTER — Telehealth: Payer: Self-pay

## 2019-03-16 NOTE — Telephone Encounter (Signed)
Baxter Flattery and Dr Ronnald Ramp are out of the office today.  Patient called stating she seen UC for UTI. They told her to take single dose Monurol and she took this Friday, 02/11/2019. She is concerned its not working. Was told to follow up with Dr Ronnald Ramp.   Told patient to give medication 1 week to work. Scheduled for Friday for UTI follow up if no better.  Urine Culture is in labs for viewing from her appointment with UC.  cnm.

## 2019-03-16 NOTE — Telephone Encounter (Signed)
Spoke to patient on the phone, pt had taken the one dose of fosfomycin, pt states "well im not sure if it made any difference." Pt will call her PCP today for further instruction.

## 2019-03-20 ENCOUNTER — Ambulatory Visit (INDEPENDENT_AMBULATORY_CARE_PROVIDER_SITE_OTHER): Payer: Medicare Other | Admitting: Family Medicine

## 2019-03-20 ENCOUNTER — Other Ambulatory Visit: Payer: Self-pay

## 2019-03-20 ENCOUNTER — Encounter: Payer: Self-pay | Admitting: Family Medicine

## 2019-03-20 VITALS — BP 132/70 | HR 60 | Ht 65.0 in | Wt 195.0 lb

## 2019-03-20 DIAGNOSIS — D518 Other vitamin B12 deficiency anemias: Secondary | ICD-10-CM | POA: Diagnosis not present

## 2019-03-20 DIAGNOSIS — B373 Candidiasis of vulva and vagina: Secondary | ICD-10-CM

## 2019-03-20 DIAGNOSIS — B3731 Acute candidiasis of vulva and vagina: Secondary | ICD-10-CM

## 2019-03-20 DIAGNOSIS — Z87440 Personal history of urinary (tract) infections: Secondary | ICD-10-CM

## 2019-03-20 LAB — POCT URINALYSIS DIPSTICK
Bilirubin, UA: NEGATIVE
Blood, UA: NEGATIVE
Glucose, UA: NEGATIVE
Ketones, UA: NEGATIVE
Leukocytes, UA: NEGATIVE
Nitrite, UA: NEGATIVE
Protein, UA: NEGATIVE
Spec Grav, UA: 1.01 (ref 1.010–1.025)
Urobilinogen, UA: 0.2 E.U./dL
pH, UA: 6 (ref 5.0–8.0)

## 2019-03-20 MED ORDER — CYANOCOBALAMIN 1000 MCG/ML IJ SOLN
1000.0000 ug | Freq: Once | INTRAMUSCULAR | Status: AC
  Administered 2019-03-20: 1000 ug via INTRAMUSCULAR

## 2019-03-20 MED ORDER — NYSTATIN 100000 UNIT/GM EX CREA: 1.0000 "application " | TOPICAL_CREAM | Freq: Two times a day (BID) | CUTANEOUS | 0 refills | Status: DC

## 2019-03-20 NOTE — Progress Notes (Signed)
Date:  03/20/2019   Name:  Brenda [name]   DOB:  Feb 01, 1931   MRN:  [id number]   Chief Complaint: Follow-up (UTI- feels better) and b12 def (needs B12)  Urinary Tract Infection  This is a recurrent problem. The current episode started more than 1 year ago. The problem has been resolved. The patient is experiencing no pain. There has been no fever. Pertinent negatives include no chills, discharge, flank pain, frequency, hematuria, hesitancy, nausea, possible pregnancy, sweats, urgency or vomiting. Treatments tried: Monurol. The treatment provided moderate relief.    Review of Systems  Constitutional: Negative.  Negative for chills, fatigue, fever and unexpected weight change.  HENT: Negative for congestion, ear discharge, ear pain, rhinorrhea, sinus pressure, sneezing and sore throat.   Eyes: Negative for photophobia, pain, discharge, redness and itching.  Respiratory: Negative for cough, shortness of breath, wheezing and stridor.   Cardiovascular: Negative for chest pain, palpitations and leg swelling.  Gastrointestinal: Negative for abdominal pain, blood in stool, constipation, diarrhea, nausea and vomiting.  Endocrine: Negative for cold intolerance, heat intolerance, polydipsia, polyphagia and polyuria.  Genitourinary: Negative for dysuria, flank pain, frequency, hematuria, hesitancy, menstrual problem, pelvic pain, urgency, vaginal bleeding and vaginal discharge.  Musculoskeletal: Negative for arthralgias, back pain and myalgias.  Skin: Negative for rash.  Allergic/Immunologic: Negative for environmental allergies and food allergies.  Neurological: Negative for dizziness, weakness, light-headedness, numbness and headaches.  Hematological: Negative for adenopathy. Does not bruise/bleed easily.  Psychiatric/Behavioral: Negative for dysphoric mood. The patient is not nervous/anxious.     Patient Active Problem List   Diagnosis Date Noted  . Hypertension associated with type 2 diabetes  mellitus ([place]) 03/09/2019  . Diabetic peripheral neuropathy associated with type 2 diabetes mellitus ([place]) 07/25/2018  . Persistent microalbuminuria associated with type 2 diabetes mellitus ([place]) 07/22/2018  . Screening for breast cancer 08/18/2015  . B12 deficiency 12/02/2014  . Lumbago 07/09/2014  . Shortness of breath 06/30/2014  . Obese 06/30/2014  . Microalbuminuria 06/10/2014  . Right facial pain 05/18/2014  . Anemia 01/01/2014  . Osteoarthritis 07/31/2013  . Other malaise and fatigue 07/09/2013  . Muscle cramp, nocturnal 03/25/2013  . Increased frequency of urination 02/09/2013  . Urge incontinence 02/09/2013  . Medicare annual wellness visit, subsequent 12/02/2012  . Dermatitis 12/02/2012  . Other and unspecified hyperlipidemia 12/02/2012  . Essential hypertension, benign 12/02/2012  . Chronic low back pain 08/28/2012  . Osteoporosis 07/23/2012  . Nocturia 06/26/2012  . Paresthesia of bilateral legs 05/30/2012  . Pernicious anemia 05/30/2012  . Type 2 diabetes mellitus with hyperglycemia, without long-term current use of insulin ([place]) 05/30/2012  . Depression 05/30/2012  . Insomnia 05/30/2012  . Weakness of both legs 05/30/2012    Allergies  Allergen Reactions  . Aspirin Other (See Comments)    bleeding  . Sulfa Antibiotics Swelling  . Oxybutynin Other (See Comments)    Dry mouth    Past Surgical History:  Procedure Laterality Date  . ABDOMINAL HYSTERECTOMY    . FOOT SURGERY  2000  . SHOULDER SURGERY  2013   fracture right shoulder    Social History   Tobacco Use  . Smoking status: Never Smoker  . Smokeless tobacco: Never Used  . Tobacco comment: smoking cessation materials not required  Substance Use Topics  . Alcohol use: No  . Drug use: No     Medication list has been reviewed and updated.  Current Meds  Medication Sig  . ACCU-CHEK AVIVA PLUS test strip USE  1 STRIP TO CHECK GLUCOSE THREE TIMES DAILY  . Accu-Chek Softclix Lancets lancets 3  (three) times daily. Use as instructed. E11.42  . cholecalciferol (VITAMIN D) 1000 units tablet Take 4,000 Units by mouth daily.   . cyanocobalamin (,VITAMIN B-12,) 1000 MCG/ML injection Inject 1,000 mcg into the muscle every 30 (thirty) days.  . ferrous sulfate 324 (65 Fe) MG TBEC Take 1 tablet (325 mg total) by mouth daily.  Marland Kitchen glipiZIDE (GLUCOTROL) 10 MG tablet Take 1 tablet by mouth 2 (two) times a day.  . lisinopril (PRINIVIL,ZESTRIL) 5 MG tablet Take 1 tablet (5 mg total) by mouth daily.  Marland Kitchen loratadine (CLARITIN) 10 MG tablet Take 1 tablet (10 mg total) by mouth daily.  . metFORMIN (GLUCOPHAGE) 1000 MG tablet Take 1 tablet (1,000 mg total) by mouth 2 (two) times daily with a meal.  . mometasone (ELOCON) 0.1 % lotion APP TOPICALLY TO SKIN ON SCALP ONCE D  . pantoprazole (PROTONIX) 40 MG tablet Take 1 tablet (40 mg total) by mouth daily.  . pioglitazone (ACTOS) 15 MG tablet Take 1 tablet by mouth daily.  Marland Kitchen PROAIR HFA 108 (90 Base) MCG/ACT inhaler INHALE 2 PUFFS INTO THE LUNGS EVERY 6 HOURS AS NEEDED FOR WHEEZING OR SHORTNESS OF BREATH  . sucralfate (CARAFATE) 1 G tablet Take 1 g by mouth as needed.    Current Facility-Administered Medications for the 03/20/19 encounter (Office Visit) with Juline Patch, MD  Medication  . ipratropium-albuterol (DUONEB) 0.5-2.5 (3) MG/3ML nebulizer solution 3 mL    PHQ 2/9 Scores 03/09/2019 02/19/2018 02/18/2017 01/23/2016  PHQ - 2 Score 2 0 0 0  PHQ- 9 Score 6 0 - -    BP Readings from Last 3 Encounters:  03/20/19 132/70  03/11/19 138/69  03/09/19 129/69    Physical Exam Vitals signs and nursing note reviewed.  Constitutional:      General: She is not in acute distress.    Appearance: She is well-developed. She is not diaphoretic.  HENT:     Head: Normocephalic and atraumatic.     Right Ear: Tympanic membrane and external ear normal.     Left Ear: Tympanic membrane and external ear normal.     Nose: Nose normal.  Eyes:     General: Lids are  everted, no foreign bodies appreciated. No scleral icterus.       Right eye: No discharge.        Left eye: No foreign body, discharge or hordeolum.     Conjunctiva/sclera: Conjunctivae normal.     Right eye: Right conjunctiva is not injected.     Left eye: Left conjunctiva is not injected.     Pupils: Pupils are equal, round, and reactive to light.  Neck:     Musculoskeletal: Normal range of motion and neck supple.     Thyroid: No thyromegaly.     Vascular: No JVD.     Trachea: No tracheal deviation.  Cardiovascular:     Rate and Rhythm: Normal rate and regular rhythm.     Heart sounds: Normal heart sounds, S1 normal and S2 normal. No murmur. No systolic murmur. No diastolic murmur. No friction rub. No gallop. No S3 or S4 sounds.   Pulmonary:     Effort: Pulmonary effort is normal. No respiratory distress.     Breath sounds: Normal breath sounds. No wheezing or rales.  Abdominal:     General: Bowel sounds are normal.     Palpations: Abdomen is soft. There is no mass.  Tenderness: There is no abdominal tenderness. There is no guarding or rebound.  Musculoskeletal: Normal range of motion.        General: No tenderness.  Lymphadenopathy:     Cervical: No cervical adenopathy.  Skin:    General: Skin is warm and dry.     Findings: No rash.  Neurological:     Mental Status: She is alert and oriented to person, place, and time.     Cranial Nerves: No cranial nerve deficit.     Deep Tendon Reflexes: Reflexes are normal and symmetric. Reflexes normal.  Psychiatric:        Mood and Affect: Mood is not anxious or depressed.     Wt Readings from Last 3 Encounters:  03/20/19 195 lb (88.5 kg)  03/11/19 194 lb (88 kg)  03/09/19 194 lb (88 kg)    BP 132/70   Pulse 60   Wt 195 lb (88.5 kg)   BMI 32.45 kg/m   Assessment and Plan:  1. History of recurrent UTI (urinary tract infection) Patient has had recurrent UTIs approximately 1 every 2 months and the last one was significant  and that it had multiple resistant disease.  Urine is clear today entirely and we discussed hygiene with patient.  Patient has seen Dr. Bernardo Heater in the past and would like to see him concerning this recurrence in the meantime we will cover for the possibility of yeast infections because of the nature of her wearing diapers and monitoring a swimming pool at home. - Ambulatory referral to Urology - POCT urinalysis dipstick  2. Candidiasis of female genitalia Patient has had occasional yeast infections during the course of which involves the fact that she wears a protective covering.  Will initiate nystatin cream to be used at night as needed rash. - nystatin cream (MYCOSTATIN); Apply 1 application topically 2 (two) times daily.  Dispense: 30 g; Refill: 0  3. Other vitamin B12 deficiency anemia Patient receives IM injections on a frequent basis for B12 deficiency and will receive one today. - cyanocobalamin ((VITAMIN B-12)) injection 1,000 mcg

## 2019-04-06 ENCOUNTER — Other Ambulatory Visit: Payer: Self-pay

## 2019-04-06 DIAGNOSIS — N390 Urinary tract infection, site not specified: Secondary | ICD-10-CM

## 2019-04-07 ENCOUNTER — Encounter: Payer: Self-pay | Admitting: Urology

## 2019-04-07 ENCOUNTER — Other Ambulatory Visit: Payer: Self-pay

## 2019-04-07 ENCOUNTER — Ambulatory Visit (INDEPENDENT_AMBULATORY_CARE_PROVIDER_SITE_OTHER): Payer: Medicare Other | Admitting: Urology

## 2019-04-07 ENCOUNTER — Other Ambulatory Visit
Admission: RE | Admit: 2019-04-07 | Discharge: 2019-04-07 | Disposition: A | Discharge status: Home / Self Care | Payer: Medicare Other | Attending: Urology | Admitting: Urology

## 2019-04-07 VITALS — BP 125/76 | HR 96 | Ht 65.0 in | Wt 195.0 lb

## 2019-04-07 DIAGNOSIS — N390 Urinary tract infection, site not specified: Secondary | ICD-10-CM | POA: Diagnosis not present

## 2019-04-07 DIAGNOSIS — N3281 Overactive bladder: Secondary | ICD-10-CM | POA: Diagnosis not present

## 2019-04-07 DIAGNOSIS — N3941 Urge incontinence: Secondary | ICD-10-CM

## 2019-04-07 LAB — URINALYSIS, COMPLETE (UACMP) WITH MICROSCOPIC
Bilirubin Urine: NEGATIVE
Glucose, UA: 100 mg/dL — AB
Ketones, ur: NEGATIVE mg/dL
Nitrite: POSITIVE — AB
Protein, ur: NEGATIVE mg/dL
Specific Gravity, Urine: 1.025 (ref 1.005–1.030)
Squamous Epithelial / HPF: NONE SEEN (ref 0–5)
WBC, UA: >50 WBC/hpf (ref 0–5)
pH: 5 (ref 5.0–8.0)

## 2019-04-07 LAB — BLADDER SCAN AMB NON-IMAGING

## 2019-04-07 MED ORDER — MIRABEGRON ER 50 MG PO TB24: 50.0000 mg | ORAL_TABLET | Freq: Every day | ORAL | 11 refills | Status: DC

## 2019-04-07 NOTE — Progress Notes (Signed)
04/07/19 2:14 PM   [name] [birth year] [id number]  Referring provider: Juline Patch, MD [place],  [place] [id number]  CC: Incontinence, "recurrent UTI"  HPI: I saw Ms. Goltz in urology clinic today in consultation for urinary incontinence and " recurrent UTI" from Dr. Ronnald Ramp.  She is an 85 year old female with diabetes and long history of urinary urge incontinence.  She reports she has had greater than 10 years of severe urinary urge incontinence where she cannot make to the restroom in time and her bladder leaks significantly.  She also leaks significant amount of urine overnight.  She is reportedly been followed by other urologists and trialed on anticholinergics without any improvement in her symptoms(no outside records available).  She denies any gross hematuria or flank pain.  She has a urine culture from 03/11/2019 that showed multi resistant E. coli, and she was having some mild dysuria at that time.  This was treated with fosfomycin, and her symptoms improved.  She denies any symptoms of pelvic pain, dysuria, fever, weakness, or flank pain today.  Urinalysis is notable for greater than 50 WBCs, many bacteria, nitrite positive.  She is a never smoker and denies any other carcinogenic exposures.  She drinks primarily water during the day, and denies significant coffee, soda, or tea consumption.  PMH: Past Medical History:  Diagnosis Date  . Arthritis   . Blood in stool   . Cancer ([employer])   . Chicken pox   . Diabetes mellitus 2000  . Dry skin   . GERD (gastroesophageal reflux disease)   . Hiatal hernia   . Hypertension   . Kidney infection 2014  . Ovarian cancer ([place]) 1991   s/p hysterectomy at [facility]  . Pernicious anemia   . Spinal stenosis   . Ulcer   . Urinary incontinence     Surgical History: Past Surgical History:  Procedure Laterality Date  . ABDOMINAL HYSTERECTOMY    . FOOT SURGERY  2000  . SHOULDER SURGERY  2013   fracture right  shoulder   Allergies:  Allergies  Allergen Reactions  . Aspirin Other (See Comments)    bleeding  . Sulfa Antibiotics Swelling  . Oxybutynin Other (See Comments)    Dry mouth    Family History: Family History  Problem Relation Age of Onset  . Arthritis Mother   . Cancer Mother        colon  . Arthritis Father   . Cancer Father        colon  . Cancer Sister        lung  . Cancer Other        lung    Social History:  reports that she has never smoked. She has never used smokeless tobacco. She reports that she does not drink alcohol or use drugs.  ROS: Please see flowsheet from today's date for complete review of systems.  Physical Exam: BP 125/76 (BP Location: Left Arm, Patient Position: Sitting)   Pulse 96   Ht 5\' 5"  (1.651 m)   Wt 195 lb (88.5 kg)   BMI 32.45 kg/m    Constitutional:  Alert and oriented, No acute distress. Cardiovascular: No clubbing, cyanosis, or edema. Respiratory: Normal respiratory effort, no increased work of breathing. GI: Abdomen is soft, nontender, nondistended, no abdominal masses GU: No CVA tenderness Lymph: No cervical or inguinal lymphadenopathy. Skin: No rashes, bruises or suspicious lesions. Neurologic: Grossly intact, no focal deficits, moving all 4 extremities. Psychiatric: Normal  mood and affect.  Laboratory Data: Prior cultures positive for E. coli reviewed  Urinalysis today nitrite positive, greater than 50 WBCs, many bacteria (asymptomatic)  Pertinent Imaging: MR abdomen in February 2019 with no hydronephrosis  Assessment & Plan:   In summary, Ms. Kumagai is an 85 year old female with primary complaint of chronic severe urinary urgency and urge incontinence, as well as a possible UTI in early July 2020.  Her urinalysis shows many bacteria and is nitrite positive today, however she is completely asymptomatic, and is more consistent with asymptomatic bacteriuria.  We discussed the evaluation and treatment of patients with  recurrent UTIs at length.  We specifically discussed the differences between asymptomatic bacteriuria and true urinary tract infection.  We discussed the AUA definition of recurrent UTI of at least 2 culture proven symptomatic acute cystitis episodes in a 66-month period, or 3 within a 1 year period.  We discussed the importance of culture directed antibiotic treatment, and antibiotic stewardship.  First-line therapy includes nitrofurantoin(5 days), Bactrim(3 days), or fosfomycin(3 g single dose).  Possible etiologies of recurrent infection include periurethral tissue atrophy in postmenopausal woman, constipation, sexual activity, incomplete emptying, anatomic abnormalities, and even genetic predisposition.  Finally, we discussed the role of perineal hygiene, timed voiding, adequate hydration, topical vaginal estrogen, cranberry prophylaxis, and low-dose antibiotic prophylaxis.  We discussed that overactive bladder (OAB) is not a disease, but is a symptom complex that is generally not life-threatening.  Symptoms typically include urinary urgency, frequency, and urge incontinence.  There are numerous treatment options, however there are risks and benefits with both medical and surgical management.  First-line treatment is behavioral therapies including bladder training, pelvic floor muscle training, and fluid management.  Second line treatments include oral antimuscarinics(Ditropan er, Trospium) and beta-3 agonist (Mybetriq). There is typically a period of medication trial (4-8 weeks) to find the optimal therapy and dosing. If symptoms are bothersome despite the above management, third line options include intra-detrusor botox, peripheral tibial nerve stimulation (PTNS), and interstim (SNS). These are more invasive treatments with higher side effect profile, but may improve quality of life for patients with severe OAB symptoms.    -Behavioral strategies discussed -Trial of mybetriq 50mg  daily, samples given  -RTC 2 months for symptom check, consider PTNS if ongoing severe OAB symptoms -We discussed need for U/A and culture if she has dysuria, fever, or pelvic pain, but I again reiterated the difference between asymptomatic bacteriuria and true UTI   Billey Co, MD  [facility] [address], [place], [place] [address] [phone]

## 2019-04-07 NOTE — Patient Instructions (Addendum)
Urinary Tract Infection, Adult  A urinary tract infection (UTI) is an infection of any part of the urinary tract. The urinary tract includes the kidneys, ureters, bladder, and urethra. These organs make, store, and get rid of urine in the body. Your health care provider may use other names to describe the infection. An upper UTI affects the ureters and kidneys (pyelonephritis). A lower UTI affects the bladder (cystitis) and urethra (urethritis). What are the causes? Most urinary tract infections are caused by bacteria in your genital area, around the entrance to your urinary tract (urethra). These bacteria grow and cause inflammation of your urinary tract. What increases the risk? You are more likely to develop this condition if:  You have a urinary catheter that stays in place (indwelling).  You are not able to control when you urinate or have a bowel movement (you have incontinence).  You are female and you: ? Use a spermicide or diaphragm for birth control. ? Have low estrogen levels. ? Are pregnant.  You have certain genes that increase your risk (genetics).  You are sexually active.  You take antibiotic medicines.  You have a condition that causes your flow of urine to slow down, such as: ? An enlarged prostate, if you are female. ? Blockage in your urethra (stricture). ? A kidney stone. ? A nerve condition that affects your bladder control (neurogenic bladder). ? Not getting enough to drink, or not urinating often.  You have certain medical conditions, such as: ? Diabetes. ? A weak disease-fighting system (immunesystem). ? Sickle cell disease. ? Gout. ? Spinal cord injury. What are the signs or symptoms? Symptoms of this condition include:  Needing to urinate right away (urgently).  Frequent urination or passing small amounts of urine frequently.  Pain or burning with urination.  Blood in the urine.  Urine that smells bad or unusual.  Trouble urinating.  Cloudy  urine.  Vaginal discharge, if you are female.  Pain in the abdomen or the lower back. You may also have:  Vomiting or a decreased appetite.  Confusion.  Irritability or tiredness.  A fever.  Diarrhea. The first symptom in older adults may be confusion. In some cases, they may not have any symptoms until the infection has worsened. How is this diagnosed? This condition is diagnosed based on your medical history and a physical exam. You may also have other tests, including:  Urine tests.  Blood tests.  Tests for sexually transmitted infections (STIs). If you have had more than one UTI, a cystoscopy or imaging studies may be done to determine the cause of the infections. How is this treated? Treatment for this condition includes:  Antibiotic medicine.  Over-the-counter medicines to treat discomfort.  Drinking enough water to stay hydrated. If you have frequent infections or have other conditions such as a kidney stone, you may need to see a health care provider who specializes in the urinary tract (urologist). In rare cases, urinary tract infections can cause sepsis. Sepsis is a life-threatening condition that occurs when the body responds to an infection. Sepsis is treated in the hospital with IV antibiotics, fluids, and other medicines. Follow these instructions at home:  Medicines  Take over-the-counter and prescription medicines only as told by your health care provider.  If you were prescribed an antibiotic medicine, take it as told by your health care provider. Do not stop using the antibiotic even if you start to feel better. General instructions  Make sure you: ? Empty your bladder often and   completely. Do not hold urine for long periods of time. ? Empty your bladder after sex. ? Wipe from front to back after a bowel movement if you are female. Use each tissue one time when you wipe.  Drink enough fluid to keep your urine pale yellow.  Keep all follow-up  visits as told by your health care provider. This is important. Contact a health care provider if:  Your symptoms do not get better after 1-2 days.  Your symptoms go away and then return. Get help right away if you have:  Severe pain in your back or your lower abdomen.  A fever.  Nausea or vomiting. Summary  A urinary tract infection (UTI) is an infection of any part of the urinary tract, which includes the kidneys, ureters, bladder, and urethra.  Most urinary tract infections are caused by bacteria in your genital area, around the entrance to your urinary tract (urethra).  Treatment for this condition often includes antibiotic medicines.  If you were prescribed an antibiotic medicine, take it as told by your health care provider. Do not stop using the antibiotic even if you start to feel better.  Keep all follow-up visits as told by your health care provider. This is important. This information is not intended to replace advice given to you by your health care provider. Make sure you discuss any questions you have with your health care provider. Document Released: 06/06/2005 Document Revised: 08/14/2018 Document Reviewed: 03/06/2018 Elsevier Patient Education  [employer] [place].   Overactive Bladder, Adult  Overactive bladder refers to a condition in which a person has a sudden need to pass urine. The person may leak urine if he or she cannot get to the bathroom fast enough (urinary incontinence). A person with this condition may also wake up several times in the night to go to the bathroom. Overactive bladder is associated with poor nerve signals between your bladder and your brain. Your bladder may get the signal to empty before it is full. You may also have very sensitive muscles that make your bladder squeeze too soon. These symptoms might interfere with daily work or social activities. What are the causes? This condition may be associated with or caused by:  Urinary tract  infection.  Infection of nearby tissues, such as the prostate.  Prostate enlargement.  Surgery on the uterus or urethra.  Bladder stones, inflammation, or tumors.  Drinking too much caffeine or alcohol.  Certain medicines, especially medicines that get rid of extra fluid in the body (diuretics).  Muscle or nerve weakness, especially from: ? A spinal cord injury. ? Stroke. ? Multiple sclerosis. ? Parkinson's disease.  Diabetes.  Constipation. What increases the risk? You may be at greater risk for overactive bladder if you:  Are an older adult.  Smoke.  Are going through menopause.  Have prostate problems.  Have a neurological disease, such as stroke, dementia, Parkinson's disease, or multiple sclerosis (MS).  Eat or drink things that irritate the bladder. These include alcohol, spicy food, and caffeine.  Are overweight or obese. What are the signs or symptoms? Symptoms of this condition include:  Sudden, strong urge to urinate.  Leaking urine.  Urinating 8 or more times a day.  Waking up to urinate 2 or more times a night. How is this diagnosed? Your health care provider may suspect overactive bladder based on your symptoms. He or she will diagnose this condition by:  A physical exam and medical history.  Blood or urine tests. You might  need bladder or urine tests to help determine what is causing your overactive bladder. You might also need to see a health care provider who specializes in urinary tract problems (urologist). How is this treated? Treatment for overactive bladder depends on the cause of your condition and whether it is mild or severe. You can also make lifestyle changes at home. Options include:  Bladder training. This may include: ? Learning to control the urge to urinate by following a schedule that directs you to urinate at regular intervals (timed voiding). ? Doing Kegel exercises to strengthen your pelvic floor muscles, which support  your bladder. Toning these muscles can help you control urination, even if your bladder muscles are overactive.  Special devices. This may include: ? Biofeedback, which uses sensors to help you become aware of your body's signals. ? Electrical stimulation, which uses electrodes placed inside the body (implanted) or outside the body. These electrodes send gentle pulses of electricity to strengthen the nerves or muscles that control the bladder. ? Women may use a plastic device that fits into the vagina and supports the bladder (pessary).  Medicines. ? Antibiotics to treat bladder infection. ? Antispasmodics to stop the bladder from releasing urine at the wrong time. ? Tricyclic antidepressants to relax bladder muscles. ? Injections of botulinum toxin type A directly into the bladder tissue to relax bladder muscles.  Lifestyle changes. This may include: ? Weight loss. Talk to your health care provider about weight loss methods that would work best for you. ? Diet changes. This may include reducing how much alcohol and caffeine you consume, or drinking fluids at different times of the day. ? Not smoking. Do not use any products that contain nicotine or tobacco, such as cigarettes and e-cigarettes. If you need help quitting, ask your health care provider.  Surgery. ? A device may be implanted to help manage the nerve signals that control urination. ? An electrode may be implanted to stimulate electrical signals in the bladder. ? A procedure may be done to change the shape of the bladder. This is done only in very severe cases. Follow these instructions at home: Lifestyle  Make any diet or lifestyle changes that are recommended by your health care provider. These may include: ? Drinking less fluid or drinking fluids at different times of the day. ? Cutting down on caffeine or alcohol. ? Doing Kegel exercises. ? Losing weight if needed. ? Eating a healthy and balanced diet to prevent  constipation. This may include:  Eating foods that are high in fiber, such as fresh fruits and vegetables, whole grains, and beans.  Limiting foods that are high in fat and processed sugars, such as fried and sweet foods. General instructions  Take over-the-counter and prescription medicines only as told by your health care provider.  If you were prescribed an antibiotic medicine, take it as told by your health care provider. Do not stop taking the antibiotic even if you start to feel better.  Use any implants or pessary as told by your health care provider.  If needed, wear pads to absorb urine leakage.  Keep a journal or log to track how much and when you drink and when you feel the need to urinate. This will help your health care provider monitor your condition.  Keep all follow-up visits as told by your health care provider. This is important. Contact a health care provider if:  You have a fever.  Your symptoms do not get better with treatment.  Your  pain and discomfort get worse.  You have more frequent urges to urinate. Get help right away if:  You are not able to control your bladder. Summary  Overactive bladder refers to a condition in which a person has a sudden need to pass urine.  Several conditions may lead to an overactive bladder.  Treatment for overactive bladder depends on the cause and severity of your condition.  Follow your health care provider's instructions about lifestyle changes, doing Kegel exercises, keeping a journal, and taking medicines. This information is not intended to replace advice given to you by your health care provider. Make sure you discuss any questions you have with your health care provider. Document Released: 06/23/2009 Document Revised: 12/18/2018 Document Reviewed: 09/12/2017 Elsevier Patient Education  2020 [employer].

## 2019-04-10 LAB — URINE CULTURE: Culture: >=100000 — AB

## 2019-04-21 DIAGNOSIS — E1165 Type 2 diabetes mellitus with hyperglycemia: Secondary | ICD-10-CM | POA: Diagnosis not present

## 2019-04-21 DIAGNOSIS — E538 Deficiency of other specified B group vitamins: Secondary | ICD-10-CM | POA: Diagnosis not present

## 2019-04-21 DIAGNOSIS — E559 Vitamin D deficiency, unspecified: Secondary | ICD-10-CM | POA: Diagnosis not present

## 2019-04-23 DIAGNOSIS — E119 Type 2 diabetes mellitus without complications: Secondary | ICD-10-CM | POA: Diagnosis not present

## 2019-04-28 DIAGNOSIS — E669 Obesity, unspecified: Secondary | ICD-10-CM | POA: Diagnosis not present

## 2019-04-28 DIAGNOSIS — I10 Essential (primary) hypertension: Secondary | ICD-10-CM | POA: Diagnosis not present

## 2019-04-28 DIAGNOSIS — E1142 Type 2 diabetes mellitus with diabetic polyneuropathy: Secondary | ICD-10-CM | POA: Diagnosis not present

## 2019-04-28 DIAGNOSIS — R809 Proteinuria, unspecified: Secondary | ICD-10-CM | POA: Diagnosis not present

## 2019-04-28 DIAGNOSIS — E1159 Type 2 diabetes mellitus with other circulatory complications: Secondary | ICD-10-CM | POA: Diagnosis not present

## 2019-04-28 DIAGNOSIS — E1165 Type 2 diabetes mellitus with hyperglycemia: Secondary | ICD-10-CM | POA: Diagnosis not present

## 2019-04-28 DIAGNOSIS — E1129 Type 2 diabetes mellitus with other diabetic kidney complication: Secondary | ICD-10-CM | POA: Diagnosis not present

## 2019-04-28 DIAGNOSIS — E559 Vitamin D deficiency, unspecified: Secondary | ICD-10-CM | POA: Diagnosis not present

## 2019-04-28 DIAGNOSIS — E538 Deficiency of other specified B group vitamins: Secondary | ICD-10-CM | POA: Diagnosis not present

## 2019-05-29 ENCOUNTER — Ambulatory Visit (INDEPENDENT_AMBULATORY_CARE_PROVIDER_SITE_OTHER): Payer: Medicare Other | Admitting: Family Medicine

## 2019-05-29 ENCOUNTER — Encounter: Payer: Self-pay | Admitting: Family Medicine

## 2019-05-29 ENCOUNTER — Other Ambulatory Visit: Payer: Self-pay

## 2019-05-29 VITALS — BP 118/62 | HR 60 | Ht 65.0 in | Wt 194.0 lb

## 2019-05-29 DIAGNOSIS — N3281 Overactive bladder: Secondary | ICD-10-CM

## 2019-05-29 DIAGNOSIS — B373 Candidiasis of vulva and vagina: Secondary | ICD-10-CM

## 2019-05-29 DIAGNOSIS — R350 Frequency of micturition: Secondary | ICD-10-CM

## 2019-05-29 DIAGNOSIS — B3731 Acute candidiasis of vulva and vagina: Secondary | ICD-10-CM

## 2019-05-29 DIAGNOSIS — E538 Deficiency of other specified B group vitamins: Secondary | ICD-10-CM

## 2019-05-29 DIAGNOSIS — J449 Chronic obstructive pulmonary disease, unspecified: Secondary | ICD-10-CM | POA: Insufficient documentation

## 2019-05-29 DIAGNOSIS — E118 Type 2 diabetes mellitus with unspecified complications: Secondary | ICD-10-CM

## 2019-05-29 LAB — POCT URINALYSIS DIPSTICK
Bilirubin, UA: NEGATIVE
Blood, UA: NEGATIVE
Glucose, UA: NEGATIVE
Ketones, UA: NEGATIVE
Leukocytes, UA: NEGATIVE
Nitrite, UA: POSITIVE
Protein, UA: NEGATIVE
Spec Grav, UA: 1.02 (ref 1.010–1.025)
Urobilinogen, UA: 0.2 E.U./dL
pH, UA: 6 (ref 5.0–8.0)

## 2019-05-29 MED ORDER — NYSTATIN 100000 UNIT/GM EX CREA: 1.0000 "application " | TOPICAL_CREAM | Freq: Two times a day (BID) | CUTANEOUS | 0 refills | Status: DC

## 2019-05-29 MED ORDER — CYANOCOBALAMIN 1000 MCG/ML IJ SOLN
1000.0000 ug | Freq: Once | INTRAMUSCULAR | Status: AC
  Administered 2019-05-29: 1000 ug via INTRAMUSCULAR

## 2019-05-29 MED ORDER — FLUCONAZOLE 150 MG PO TABS: 150.0000 mg | ORAL_TABLET | Freq: Once | ORAL | 0 refills | Status: AC

## 2019-05-29 NOTE — Progress Notes (Signed)
Date:  05/29/2019   Name:  Brenda [name]   DOB:  1931-03-12   MRN:  [id number]   Chief Complaint: B12 Injection and Urinary Tract Infection (frequent urination/ pinkish discharge)  Urinary Tract Infection  This is a new problem. The current episode started in the past 7 days. The problem occurs every urination. The problem has been waxing and waning. The quality of the pain is described as burning. The pain is mild. Associated symptoms include frequency and urgency. Pertinent negatives include no chills, discharge, flank pain, hematuria, hesitancy, nausea, sweats or vomiting.    Review of Systems  Constitutional: Negative for chills and fever.  HENT: Negative for drooling, ear discharge, ear pain and sore throat.   Respiratory: Negative for cough, shortness of breath and wheezing.   Cardiovascular: Negative for chest pain, palpitations and leg swelling.  Gastrointestinal: Negative for abdominal pain, blood in stool, constipation, diarrhea, nausea and vomiting.  Endocrine: Negative for polydipsia.  Genitourinary: Positive for frequency and urgency. Negative for dysuria, flank pain, hematuria and hesitancy.  Musculoskeletal: Negative for back pain, myalgias and neck pain.  Skin: Negative for rash.  Allergic/Immunologic: Negative for environmental allergies.  Neurological: Negative for dizziness and headaches.  Hematological: Does not bruise/bleed easily.  Psychiatric/Behavioral: Negative for suicidal ideas. The patient is not nervous/anxious.     Patient Active Problem List   Diagnosis Date Noted  . Hypertension associated with type 2 diabetes mellitus ([place]) 03/09/2019  . Diabetic peripheral neuropathy associated with type 2 diabetes mellitus ([place]) 07/25/2018  . Persistent microalbuminuria associated with type 2 diabetes mellitus ([place]) 07/22/2018  . Screening for breast cancer 08/18/2015  . B12 deficiency 12/02/2014  . Lumbago 07/09/2014  . Shortness of breath 06/30/2014  . Obese  06/30/2014  . Microalbuminuria 06/10/2014  . Right facial pain 05/18/2014  . Anemia 01/01/2014  . Osteoarthritis 07/31/2013  . Other malaise and fatigue 07/09/2013  . Muscle cramp, nocturnal 03/25/2013  . Increased frequency of urination 02/09/2013  . Urge incontinence 02/09/2013  . Medicare annual wellness visit, subsequent 12/02/2012  . Dermatitis 12/02/2012  . Other and unspecified hyperlipidemia 12/02/2012  . Essential hypertension, benign 12/02/2012  . Chronic low back pain 08/28/2012  . Osteoporosis 07/23/2012  . Nocturia 06/26/2012  . Paresthesia of bilateral legs 05/30/2012  . Pernicious anemia 05/30/2012  . Type 2 diabetes mellitus with hyperglycemia, without long-term current use of insulin ([place]) 05/30/2012  . Depression 05/30/2012  . Insomnia 05/30/2012  . Weakness of both legs 05/30/2012    Allergies  Allergen Reactions  . Aspirin Other (See Comments)    bleeding  . Sulfa Antibiotics Swelling  . Oxybutynin Other (See Comments)    Dry mouth    Past Surgical History:  Procedure Laterality Date  . ABDOMINAL HYSTERECTOMY    . FOOT SURGERY  2000  . SHOULDER SURGERY  2013   fracture right shoulder    Social History   Tobacco Use  . Smoking status: Never Smoker  . Smokeless tobacco: Never Used  . Tobacco comment: smoking cessation materials not required  Substance Use Topics  . Alcohol use: No  . Drug use: No     Medication list has been reviewed and updated.  Current Meds  Medication Sig  . ACCU-CHEK AVIVA PLUS test strip USE 1 STRIP TO CHECK GLUCOSE THREE TIMES DAILY  . Accu-Chek Softclix Lancets lancets 3 (three) times daily. Use as instructed. E11.42  . cholecalciferol (VITAMIN D) 1000 units tablet Take 4,000 Units by mouth daily.   Marland Kitchen  cyanocobalamin (,VITAMIN B-12,) 1000 MCG/ML injection Inject 1,000 mcg into the muscle every 30 (thirty) days.  . ferrous sulfate 324 (65 Fe) MG TBEC Take 1 tablet (325 mg total) by mouth daily.  Marland Kitchen gabapentin  (NEURONTIN) 100 MG capsule TAKE 1 CAPSULE(100 MG) BY MOUTH EVERY NIGHT AT BEDTIME AS NEEDED  . glipiZIDE (GLUCOTROL) 10 MG tablet Take 1 tablet by mouth 2 (two) times a day.  . lisinopril (PRINIVIL,ZESTRIL) 5 MG tablet Take 1 tablet (5 mg total) by mouth daily.  Marland Kitchen loratadine (CLARITIN) 10 MG tablet Take 1 tablet (10 mg total) by mouth daily.  . metFORMIN (GLUCOPHAGE) 1000 MG tablet Take 1 tablet (1,000 mg total) by mouth 2 (two) times daily with a meal.  . mirabegron ER (MYRBETRIQ) 50 MG TB24 tablet Take 1 tablet (50 mg total) by mouth daily.  . mometasone (ELOCON) 0.1 % lotion APP TOPICALLY TO SKIN ON SCALP ONCE D  . nystatin cream (MYCOSTATIN) Apply 1 application topically 2 (two) times daily.  . pantoprazole (PROTONIX) 40 MG tablet Take 1 tablet (40 mg total) by mouth daily.  . pioglitazone (ACTOS) 15 MG tablet Take 1 tablet by mouth daily.  Marland Kitchen PROAIR HFA 108 (90 Base) MCG/ACT inhaler INHALE 2 PUFFS INTO THE LUNGS EVERY 6 HOURS AS NEEDED FOR WHEEZING OR SHORTNESS OF BREATH  . sucralfate (CARAFATE) 1 G tablet Take 1 g by mouth as needed.    Current Facility-Administered Medications for the 05/29/19 encounter (Office Visit) with Juline Patch, MD  Medication  . ipratropium-albuterol (DUONEB) 0.5-2.5 (3) MG/3ML nebulizer solution 3 mL    PHQ 2/9 Scores 03/09/2019 02/19/2018 02/18/2017 01/23/2016  PHQ - 2 Score 2 0 0 0  PHQ- 9 Score 6 0 - -    BP Readings from Last 3 Encounters:  05/29/19 118/62  04/07/19 125/76  03/20/19 132/70    Physical Exam Vitals signs and nursing note reviewed.  Constitutional:      Appearance: She is well-developed.  HENT:     Head: Normocephalic.     Right Ear: Tympanic membrane, ear canal and external ear normal.     Left Ear: Tympanic membrane, ear canal and external ear normal.     Nose: Nose normal.  Eyes:     General: Lids are everted, no foreign bodies appreciated. No scleral icterus.       Left eye: No foreign body or hordeolum.      Conjunctiva/sclera: Conjunctivae normal.     Right eye: Right conjunctiva is not injected.     Left eye: Left conjunctiva is not injected.     Pupils: Pupils are equal, round, and reactive to light.  Neck:     Musculoskeletal: Normal range of motion and neck supple.     Thyroid: No thyromegaly.     Vascular: No JVD.     Trachea: No tracheal deviation.  Cardiovascular:     Rate and Rhythm: Normal rate and regular rhythm.     Pulses: Normal pulses.     Heart sounds: Normal heart sounds. No murmur. No friction rub. No gallop.   Pulmonary:     Effort: Pulmonary effort is normal. No respiratory distress.     Breath sounds: Normal breath sounds. No wheezing, rhonchi or rales.  Abdominal:     General: Bowel sounds are normal.     Palpations: Abdomen is soft. There is no mass.     Tenderness: There is no abdominal tenderness. There is no guarding or rebound.  Musculoskeletal: Normal range of motion.  General: No tenderness.  Lymphadenopathy:     Cervical: No cervical adenopathy.  Skin:    General: Skin is warm.     Findings: No rash.  Neurological:     Mental Status: She is alert and oriented to person, place, and time.     Cranial Nerves: No cranial nerve deficit.     Deep Tendon Reflexes: Reflexes normal.  Psychiatric:        Mood and Affect: Mood is not anxious or depressed.     Wt Readings from Last 3 Encounters:  05/29/19 194 lb (88 kg)  04/07/19 195 lb (88.5 kg)  03/20/19 195 lb (88.5 kg)    BP 118/62   Pulse 60   Ht 5\' 5"  (1.651 m)   Wt 194 lb (88 kg)   BMI 32.28 kg/m   Assessment and Plan:   1. B12 deficiency Patient has a history of B12 deficiency and received vitamin B12 injection. - cyanocobalamin ((VITAMIN B-12)) injection 1,000 mcg  2. Urine frequency Patient has urinary frequency and we will obtain a urinalysis.  Urinalysis shows no evidence of a urinary tract infection therefore we will treat this more of a localized yeast infection. - POCT  Urinalysis Dipstick  3. Overactive bladder Patient has a history of overactive bladder for which she is seeing urology.  She states that the medication that she was put on made it worse so she stopped taking it.  4. Candidiasis of female genitalia Symptoms suggest that there may be a vulvovaginitis urethritis irritation by yeast will initiate Diflucan 150 mg once a day. - fluconazole (DIFLUCAN) 150 MG tablet; Take 1 tablet (150 mg total) by mouth once for 1 dose.  Dispense: 1 tablet; Refill: 0 - nystatin cream (MYCOSTATIN); Apply 1 application topically 2 (two) times daily.  Dispense: 30 g; Refill: 0  5. Type 2 diabetes mellitus with complication, without long-term current use of insulin (HCC) Patient has a history of diabetes for which she is followed by endocrinology.  This does make her more prone to yeast infections and we will treat as suggested above

## 2019-06-09 ENCOUNTER — Ambulatory Visit: Payer: Medicare Other | Admitting: Urology

## 2019-06-30 ENCOUNTER — Ambulatory Visit (INDEPENDENT_AMBULATORY_CARE_PROVIDER_SITE_OTHER): Payer: Medicare Other

## 2019-06-30 ENCOUNTER — Other Ambulatory Visit: Payer: Self-pay

## 2019-06-30 DIAGNOSIS — Z23 Encounter for immunization: Secondary | ICD-10-CM | POA: Diagnosis not present

## 2019-07-31 DIAGNOSIS — E669 Obesity, unspecified: Secondary | ICD-10-CM | POA: Diagnosis not present

## 2019-07-31 DIAGNOSIS — E1142 Type 2 diabetes mellitus with diabetic polyneuropathy: Secondary | ICD-10-CM | POA: Diagnosis not present

## 2019-07-31 DIAGNOSIS — R809 Proteinuria, unspecified: Secondary | ICD-10-CM | POA: Diagnosis not present

## 2019-07-31 DIAGNOSIS — E559 Vitamin D deficiency, unspecified: Secondary | ICD-10-CM | POA: Diagnosis not present

## 2019-07-31 DIAGNOSIS — I10 Essential (primary) hypertension: Secondary | ICD-10-CM | POA: Diagnosis not present

## 2019-07-31 DIAGNOSIS — E1129 Type 2 diabetes mellitus with other diabetic kidney complication: Secondary | ICD-10-CM | POA: Diagnosis not present

## 2019-07-31 DIAGNOSIS — E1159 Type 2 diabetes mellitus with other circulatory complications: Secondary | ICD-10-CM | POA: Diagnosis not present

## 2019-07-31 DIAGNOSIS — E538 Deficiency of other specified B group vitamins: Secondary | ICD-10-CM | POA: Diagnosis not present

## 2019-07-31 DIAGNOSIS — E1165 Type 2 diabetes mellitus with hyperglycemia: Secondary | ICD-10-CM | POA: Diagnosis not present

## 2019-10-16 ENCOUNTER — Ambulatory Visit (INDEPENDENT_AMBULATORY_CARE_PROVIDER_SITE_OTHER): Payer: Medicare Other

## 2019-10-16 ENCOUNTER — Other Ambulatory Visit: Payer: Self-pay

## 2019-10-16 DIAGNOSIS — E538 Deficiency of other specified B group vitamins: Secondary | ICD-10-CM

## 2019-10-16 MED ORDER — CYANOCOBALAMIN 1000 MCG/ML IJ SOLN
1000.0000 ug | Freq: Once | INTRAMUSCULAR | Status: AC
  Administered 2019-10-16: 1000 ug via INTRAMUSCULAR

## 2019-10-17 DIAGNOSIS — Z23 Encounter for immunization: Secondary | ICD-10-CM | POA: Diagnosis not present

## 2019-10-19 ENCOUNTER — Other Ambulatory Visit: Payer: Self-pay

## 2019-10-19 ENCOUNTER — Ambulatory Visit (INDEPENDENT_AMBULATORY_CARE_PROVIDER_SITE_OTHER): Payer: Medicare Other | Admitting: Family Medicine

## 2019-10-19 ENCOUNTER — Encounter: Payer: Self-pay | Admitting: Family Medicine

## 2019-10-19 VITALS — BP 110/62 | HR 76 | Ht 64.0 in | Wt 196.0 lb

## 2019-10-19 DIAGNOSIS — I10 Essential (primary) hypertension: Secondary | ICD-10-CM | POA: Diagnosis not present

## 2019-10-19 DIAGNOSIS — N3281 Overactive bladder: Secondary | ICD-10-CM

## 2019-10-19 NOTE — Progress Notes (Signed)
Date:  10/19/2019   Name:  Brenda [name]   DOB:  Apr 17, 1931   MRN:  [id number]   Chief Complaint: Urinary Tract Infection ("peeing alot and has an odor to it")  Urinary Frequency  This is a chronic problem. The current episode started more than 1 year ago. The problem occurs every urination. The problem has been gradually worsening. The patient is experiencing no pain. There has been no fever. The fever has been present for less than 1 day. Associated symptoms include frequency. Pertinent negatives include no chills, discharge, flank pain, hematuria, hesitancy, nausea, sweats, urgency or vomiting. Treatments tried: Mybetique. The treatment provided no relief. There is no history of catheterization, kidney stones, recurrent UTIs, a single kidney, urinary stasis or a urological procedure.  Hypertension This is a chronic problem. The current episode started more than 1 year ago. The problem has been waxing and waning since onset. The problem is controlled. Pertinent negatives include no anxiety, blurred vision, chest pain, headaches, malaise/fatigue, neck pain, orthopnea, palpitations, peripheral edema, PND, shortness of breath or sweats. There are no associated agents to hypertension. Risk factors for coronary artery disease include diabetes mellitus, dyslipidemia and obesity.    Lab Results  Component Value Date   CREATININE 0.93 04/11/2016   BUN 16 04/11/2016   NA 139 04/11/2016   K 5.3 (H) 04/11/2016   CL 99 04/11/2016   CO2 26 04/11/2016   Lab Results  Component Value Date   CHOL 149 08/18/2015   HDL 56.90 08/18/2015   LDLCALC 72 08/18/2015   TRIG 99.0 08/18/2015   CHOLHDL 3 08/18/2015   Lab Results  Component Value Date   TSH 1.63 07/09/2013   Lab Results  Component Value Date   HGBA1C 7.9 11/16/2016     Review of Systems  Constitutional: Negative.  Negative for chills, fatigue, fever, malaise/fatigue and unexpected weight change.  HENT: Negative for congestion, ear  discharge, ear pain, rhinorrhea, sinus pressure, sneezing and sore throat.   Eyes: Negative for blurred vision, photophobia, pain, discharge, redness and itching.  Respiratory: Negative for cough, shortness of breath, wheezing and stridor.   Cardiovascular: Negative for chest pain, palpitations, orthopnea and PND.  Gastrointestinal: Negative for abdominal pain, blood in stool, constipation, diarrhea, nausea and vomiting.  Endocrine: Negative for cold intolerance, heat intolerance, polydipsia, polyphagia and polyuria.  Genitourinary: Positive for frequency. Negative for dysuria, flank pain, hematuria, hesitancy, menstrual problem, pelvic pain, urgency, vaginal bleeding and vaginal discharge.  Musculoskeletal: Negative for arthralgias, back pain, myalgias and neck pain.  Skin: Negative for rash.  Allergic/Immunologic: Negative for environmental allergies and food allergies.  Neurological: Negative for dizziness, weakness, light-headedness, numbness and headaches.  Hematological: Negative for adenopathy. Does not bruise/bleed easily.  Psychiatric/Behavioral: Negative for dysphoric mood. The patient is not nervous/anxious.     Patient Active Problem List   Diagnosis Date Noted  . Chronic obstructive pulmonary disease ([place]) 05/29/2019  . Hypertension associated with type 2 diabetes mellitus ([place]) 03/09/2019  . Diabetic peripheral neuropathy associated with type 2 diabetes mellitus ([place]) 07/25/2018  . Persistent microalbuminuria associated with type 2 diabetes mellitus ([place]) 07/22/2018  . Screening for breast cancer 08/18/2015  . B12 deficiency 12/02/2014  . Lumbago 07/09/2014  . Shortness of breath 06/30/2014  . Obese 06/30/2014  . Microalbuminuria 06/10/2014  . Right facial pain 05/18/2014  . Anemia 01/01/2014  . Osteoarthritis 07/31/2013  . Other malaise and fatigue 07/09/2013  . Muscle cramp, nocturnal 03/25/2013  . Increased frequency of urination 02/09/2013  .  Urge incontinence  02/09/2013  . Medicare annual wellness visit, subsequent 12/02/2012  . Dermatitis 12/02/2012  . Other and unspecified hyperlipidemia 12/02/2012  . Essential hypertension, benign 12/02/2012  . Chronic low back pain 08/28/2012  . Osteoporosis 07/23/2012  . Nocturia 06/26/2012  . Paresthesia of bilateral legs 05/30/2012  . Pernicious anemia 05/30/2012  . Type 2 diabetes mellitus with hyperglycemia, without long-term current use of insulin ([place]) 05/30/2012  . Depression 05/30/2012  . Insomnia 05/30/2012  . Weakness of both legs 05/30/2012    Allergies  Allergen Reactions  . Aspirin Other (See Comments)    bleeding  . Sulfa Antibiotics Swelling  . Oxybutynin Other (See Comments)    Dry mouth    Past Surgical History:  Procedure Laterality Date  . ABDOMINAL HYSTERECTOMY    . FOOT SURGERY  2000  . SHOULDER SURGERY  2013   fracture right shoulder    Social History   Tobacco Use  . Smoking status: Never Smoker  . Smokeless tobacco: Never Used  . Tobacco comment: smoking cessation materials not required  Substance Use Topics  . Alcohol use: No  . Drug use: No     Medication list has been reviewed and updated.  Current Meds  Medication Sig  . ACCU-CHEK AVIVA PLUS test strip USE 1 STRIP TO CHECK GLUCOSE THREE TIMES DAILY  . Accu-Chek Softclix Lancets lancets 3 (three) times daily. Use as instructed. E11.42  . cholecalciferol (VITAMIN D) 1000 units tablet Take 4,000 Units by mouth daily.   . cyanocobalamin (,VITAMIN B-12,) 1000 MCG/ML injection Inject 1,000 mcg into the muscle every 30 (thirty) days.  . ferrous sulfate 324 (65 Fe) MG TBEC Take 1 tablet (325 mg total) by mouth daily.  Marland Kitchen gabapentin (NEURONTIN) 100 MG capsule TAKE 1 CAPSULE(100 MG) BY MOUTH EVERY NIGHT AT BEDTIME AS NEEDED  . glipiZIDE (GLUCOTROL) 10 MG tablet Take 1 tablet by mouth 2 (two) times a day.  . lisinopril (PRINIVIL,ZESTRIL) 5 MG tablet Take 1 tablet (5 mg total) by mouth daily.  Marland Kitchen loratadine  (CLARITIN) 10 MG tablet Take 1 tablet (10 mg total) by mouth daily.  . metFORMIN (GLUCOPHAGE) 1000 MG tablet Take 1 tablet (1,000 mg total) by mouth 2 (two) times daily with a meal.  . mometasone (ELOCON) 0.1 % lotion APP TOPICALLY TO SKIN ON SCALP ONCE D  . nystatin cream (MYCOSTATIN) Apply 1 application topically 2 (two) times daily.  Marland Kitchen nystatin cream (MYCOSTATIN) Apply 1 application topically 2 (two) times daily.  . pantoprazole (PROTONIX) 40 MG tablet Take 1 tablet (40 mg total) by mouth daily.  . pioglitazone (ACTOS) 15 MG tablet Take 1 tablet by mouth daily.  Marland Kitchen PROAIR HFA 108 (90 Base) MCG/ACT inhaler INHALE 2 PUFFS INTO THE LUNGS EVERY 6 HOURS AS NEEDED FOR WHEEZING OR SHORTNESS OF BREATH  . sucralfate (CARAFATE) 1 G tablet Take 1 g by mouth as needed.    Current Facility-Administered Medications for the 10/19/19 encounter (Office Visit) with Juline Patch, MD  Medication  . ipratropium-albuterol (DUONEB) 0.5-2.5 (3) MG/3ML nebulizer solution 3 mL    PHQ 2/9 Scores 03/09/2019 02/19/2018 02/18/2017 01/23/2016  PHQ - 2 Score 2 0 0 0  PHQ- 9 Score 6 0 - -    BP Readings from Last 3 Encounters:  10/19/19 110/62  05/29/19 118/62  04/07/19 125/76    Physical Exam Vitals and nursing note reviewed.  Constitutional:      General: She is not in acute distress.    Appearance: She  is not diaphoretic.  HENT:     Head: Normocephalic and atraumatic.     Right Ear: Tympanic membrane, ear canal and external ear normal. There is no impacted cerumen.     Left Ear: Tympanic membrane, ear canal and external ear normal. There is no impacted cerumen.     Nose: Nose normal. No congestion or rhinorrhea.     Mouth/Throat:     Mouth: Mucous membranes are moist.  Eyes:     General:        Right eye: No discharge.        Left eye: No discharge.     Extraocular Movements: Extraocular movements intact.     Conjunctiva/sclera: Conjunctivae normal.     Pupils: Pupils are equal, round, and reactive to  light.  Neck:     Thyroid: No thyromegaly.     Vascular: No carotid bruit or JVD.  Cardiovascular:     Rate and Rhythm: Normal rate and regular rhythm.     Pulses: Normal pulses.     Heart sounds: Normal heart sounds. No murmur. No friction rub. No gallop.   Pulmonary:     Effort: Pulmonary effort is normal.     Breath sounds: Normal breath sounds. No wheezing, rhonchi or rales.  Chest:     Chest wall: No tenderness.  Abdominal:     General: Bowel sounds are normal.     Palpations: Abdomen is soft. There is no mass.     Tenderness: There is no abdominal tenderness. There is no guarding or rebound.  Musculoskeletal:        General: Normal range of motion.     Cervical back: Normal range of motion and neck supple.     Right lower leg: No edema.  Lymphadenopathy:     Cervical: No cervical adenopathy.  Skin:    General: Skin is warm and dry.     Capillary Refill: Capillary refill takes less than 2 seconds.  Neurological:     Mental Status: She is alert.     Deep Tendon Reflexes: Reflexes are normal and symmetric.     Wt Readings from Last 3 Encounters:  10/19/19 196 lb (88.9 kg)  05/29/19 194 lb (88 kg)  04/07/19 195 lb (88.5 kg)    BP 110/62   Pulse 76   Ht 5\' 4"  (1.626 m)   Wt 196 lb (88.9 kg)   BMI 33.64 kg/m   Assessment and Plan: 1. Overactive bladder Patient has a history of overactive bladder for which she was given Myrbetriq by Dr. Jeb Levering.  Patient did not continue this because she did not feel like it helped.  However it was explained to her that it may take several months before we see the effects and patient seemingly forgot this.  Patient was given a sample for 6 weeks of Toviaz 4 mg once a day.  Patient was also given a appointment for recheck but in [facility] with [facility] urologic for her overactive bladder concern - Ambulatory referral to Urology  2. Essential hypertension, benign Chronic.  Controlled.  Stable.  Patient is on lisinopril 5 mg once a  day for both her prevention of nephrotoxicity due to her diabetes and some blood pressure lowering.  Patient has wonderful blood pressure with no symptomatology in the 110/60 range.  Patient is asymptomatic with no orthostatic dizziness and I have told her that this would be in her best interest to continue the lisinopril at current dosing.

## 2019-11-03 ENCOUNTER — Ambulatory Visit: Payer: Medicare Other | Admitting: Urology

## 2019-11-03 DIAGNOSIS — E559 Vitamin D deficiency, unspecified: Secondary | ICD-10-CM | POA: Diagnosis not present

## 2019-11-03 DIAGNOSIS — I10 Essential (primary) hypertension: Secondary | ICD-10-CM | POA: Diagnosis not present

## 2019-11-03 DIAGNOSIS — E669 Obesity, unspecified: Secondary | ICD-10-CM | POA: Diagnosis not present

## 2019-11-03 DIAGNOSIS — R809 Proteinuria, unspecified: Secondary | ICD-10-CM | POA: Diagnosis not present

## 2019-11-03 DIAGNOSIS — E538 Deficiency of other specified B group vitamins: Secondary | ICD-10-CM | POA: Diagnosis not present

## 2019-11-03 DIAGNOSIS — E1142 Type 2 diabetes mellitus with diabetic polyneuropathy: Secondary | ICD-10-CM | POA: Diagnosis not present

## 2019-11-03 DIAGNOSIS — E1159 Type 2 diabetes mellitus with other circulatory complications: Secondary | ICD-10-CM | POA: Diagnosis not present

## 2019-11-03 DIAGNOSIS — E1165 Type 2 diabetes mellitus with hyperglycemia: Secondary | ICD-10-CM | POA: Diagnosis not present

## 2019-11-03 DIAGNOSIS — E1129 Type 2 diabetes mellitus with other diabetic kidney complication: Secondary | ICD-10-CM | POA: Diagnosis not present

## 2019-11-07 DIAGNOSIS — Z23 Encounter for immunization: Secondary | ICD-10-CM | POA: Diagnosis not present

## 2019-11-16 ENCOUNTER — Other Ambulatory Visit: Payer: Self-pay

## 2019-11-16 DIAGNOSIS — N3281 Overactive bladder: Secondary | ICD-10-CM

## 2019-11-17 ENCOUNTER — Other Ambulatory Visit
Admission: RE | Admit: 2019-11-17 | Discharge: 2019-11-17 | Disposition: A | Discharge status: Home / Self Care | Payer: Medicare Other | Attending: Urology | Admitting: Urology

## 2019-11-17 ENCOUNTER — Encounter: Payer: Self-pay | Admitting: Urology

## 2019-11-17 ENCOUNTER — Ambulatory Visit (INDEPENDENT_AMBULATORY_CARE_PROVIDER_SITE_OTHER): Payer: Medicare Other | Admitting: Urology

## 2019-11-17 ENCOUNTER — Other Ambulatory Visit: Payer: Self-pay

## 2019-11-17 VITALS — BP 145/77 | HR 90 | Ht 64.0 in | Wt 196.0 lb

## 2019-11-17 DIAGNOSIS — N3281 Overactive bladder: Secondary | ICD-10-CM | POA: Diagnosis not present

## 2019-11-17 DIAGNOSIS — N390 Urinary tract infection, site not specified: Secondary | ICD-10-CM | POA: Diagnosis not present

## 2019-11-17 LAB — URINALYSIS, COMPLETE (UACMP) WITH MICROSCOPIC
Bilirubin Urine: NEGATIVE
Glucose, UA: 100 mg/dL — AB
Nitrite: POSITIVE — AB
Protein, ur: 30 mg/dL — AB
Specific Gravity, Urine: 1.025 (ref 1.005–1.030)
WBC, UA: >50 WBC/hpf (ref 0–5)
pH: 5 (ref 5.0–8.0)

## 2019-11-17 MED ORDER — TROSPIUM CHLORIDE ER 60 MG PO CP24: 60.0000 mg | ORAL_CAPSULE | Freq: Every day | ORAL | 11 refills | Status: DC

## 2019-11-17 NOTE — Patient Instructions (Signed)

## 2019-11-17 NOTE — Progress Notes (Signed)
   11/17/2019 2:41 PM   Brenda [name] 1931-08-15 [id number]  Reason for visit: Follow up urinary incontinence  HPI: I saw Brenda [name] back in urology clinic for urinary incontinence.  She is an 86 year old female with an extensive history of urge incontinence for more than 10 years.  She is been trialed on numerous medications with only minimal improvement in the past.  She also has a long history of asymptomatic bacteriuria and has multiple positive urine cultures that are completely asymptomatic.  She has been treated for these numerous times by outside providers with antibiotics, and she reports that antibiotics have never improved her urgency or urge incontinence.  Urinalysis again today consistent with asymptomatic bacteriuria.  She specifically denies any pelvic pain, dysuria, fever, weakness, confusion, or flank pain.  I saw her last in July 2020 and she tried a few weeks of Myrbetriq without any significant improvement in her urge incontinence.  She did not follow-up as recommended.  She drinks primarily water during the day.  She was recently trialed on Toviaz by her PCP which she feels improved her urinary urgency and incontinence moderately, however it gave her "crazy dreams" and she discontinue this medication.  We discussed that overactive bladder (OAB) is not a disease, but is a symptom complex that is generally not life-threatening.  Symptoms typically include urinary urgency, frequency, and urge incontinence.  There are numerous treatment options, however there are risks and benefits with both medical and surgical management.  First-line treatment is behavioral therapies including bladder training, pelvic floor muscle training, and fluid management.  Second line treatments include oral antimuscarinics(Ditropan er, Trospium) and beta-3 agonist (Mybetriq). There is typically a period of medication trial (4-8 weeks) to find the optimal therapy and dosing. If symptoms are bothersome despite  the above management, third line options include intra-detrusor botox, peripheral tibial nerve stimulation (PTNS), and interstim (SNS). These are more invasive treatments with higher side effect profile, but may improve quality of life for patients with severe OAB symptoms.   -Trial of trospium for OAB symptoms -RTC 6 weeks for symptom check -Consider PTNS in the future if patient desires an alternative strategy for urge incontinence  I spent 20 total minutes on the day of the encounter including pre-visit review of the medical record, face-to-face time with the patient, and post visit ordering of labs/imaging/tests.  Billey Co, [facility] [address], [place], [place] [address] [phone]

## 2019-11-19 LAB — URINE CULTURE: Culture: >=100000 — AB

## 2019-12-08 ENCOUNTER — Other Ambulatory Visit: Payer: Self-pay

## 2019-12-08 ENCOUNTER — Telehealth: Payer: Self-pay

## 2019-12-08 ENCOUNTER — Ambulatory Visit (INDEPENDENT_AMBULATORY_CARE_PROVIDER_SITE_OTHER): Payer: Medicare Other

## 2019-12-08 DIAGNOSIS — E538 Deficiency of other specified B group vitamins: Secondary | ICD-10-CM | POA: Diagnosis not present

## 2019-12-08 MED ORDER — TROSPIUM CHLORIDE 20 MG PO TABS: 20.0000 mg | ORAL_TABLET | Freq: Two times a day (BID) | ORAL | 11 refills | Status: DC

## 2019-12-08 MED ORDER — CYANOCOBALAMIN 1000 MCG/ML IJ SOLN
1000.0000 ug | Freq: Once | INTRAMUSCULAR | Status: AC
  Administered 2019-12-08: 1000 ug via INTRAMUSCULAR

## 2019-12-08 NOTE — Telephone Encounter (Signed)
Patient came by office today stating that she cannot afford Trospium it will be $87. Patient has tried and failed on Myrbetriq and due to age factors cannot take oxybutinin. Per Dr. Diamantina Providence patient was printed GoodRx script wand new script was sent to [place]. Patient was given coupon and verbalized understanding

## 2019-12-22 ENCOUNTER — Telehealth: Payer: Self-pay | Admitting: *Deleted

## 2019-12-22 NOTE — Telephone Encounter (Signed)
Patient called and stated thinks the Trospium is not working. It is making her feel bad. Advised  her if it makes her feel bad to stop medication .  She has a appt with Larene Beach on 12/28/2019 will discuss than .

## 2019-12-28 ENCOUNTER — Other Ambulatory Visit
Admission: RE | Admit: 2019-12-28 | Discharge: 2019-12-28 | Disposition: A | Discharge status: Home / Self Care | Payer: Medicare Other | Source: Ambulatory Visit | Attending: Urology | Admitting: Urology

## 2019-12-28 ENCOUNTER — Ambulatory Visit (INDEPENDENT_AMBULATORY_CARE_PROVIDER_SITE_OTHER): Payer: Medicare Other | Admitting: Urology

## 2019-12-28 ENCOUNTER — Encounter: Payer: Self-pay | Admitting: Urology

## 2019-12-28 ENCOUNTER — Other Ambulatory Visit: Payer: Self-pay

## 2019-12-28 VITALS — BP 114/71 | HR 86 | Ht 64.0 in | Wt 196.0 lb

## 2019-12-28 DIAGNOSIS — N952 Postmenopausal atrophic vaginitis: Secondary | ICD-10-CM

## 2019-12-28 DIAGNOSIS — N3281 Overactive bladder: Secondary | ICD-10-CM

## 2019-12-28 LAB — URINALYSIS, COMPLETE (UACMP) WITH MICROSCOPIC
Bilirubin Urine: NEGATIVE
Glucose, UA: 250 mg/dL — AB
Hgb urine dipstick: NEGATIVE
Nitrite: POSITIVE — AB
Specific Gravity, Urine: 1.025 (ref 1.005–1.030)
WBC, UA: >50 WBC/hpf (ref 0–5)
pH: 5 (ref 5.0–8.0)

## 2019-12-28 LAB — BLADDER SCAN AMB NON-IMAGING: Scan Result: 52

## 2019-12-28 MED ORDER — PREMARIN 0.625 MG/GM VA CREA: 1.0000 | TOPICAL_CREAM | Freq: Every day | VAGINAL | 12 refills | Status: DC

## 2019-12-28 NOTE — Progress Notes (Signed)
12/28/2019 7:56 PM   Brenda [name] [birth year] [id number]  Referring provider: Juline Patch, MD [address] [place],  [place] [address]  Chief Complaint  Patient presents with  . Over Active Bladder    HPI: Brenda [name] is an 86 year old female with OAB and asymptomatic bacteriuria who presents today for a 6 weeks follow up.    She told staff that she was having intermittent burning with urination and requested that an UA sample be taken.  Upon further questioning, she states that the burning occurs when the urine come in contact with her skin.  She is not having Patient denies any modifying or aggravating factors.  Patient denies any gross hematuria, dysuria or suprapubic/flank pain.  Patient denies any fevers, chills, nausea or vomiting.   UA is yellow cloudy, specific gravity 1.025, pH 5.0, 250 glucose, trace ketones, trace protein, positive nitrite, small leukocytes, 0-5 squamous epithelial cells, > 50 WBC's, 0-5 RBC's and many bacteria.    The patient is  experiencing urgency x 8 or more, frequency x 4-7, not restricting fluids to avoid visits to the restroom, is engaging in toilet mapping, incontinence x 8 or more and nocturia x 4-7.   Her BP is 114/71.   Her PVR is 52 mL.   She states the tropsium gave her crazy dreams as well.  Her biggest concerns are frequency and incontinence.     PMH: Past Medical History:  Diagnosis Date  . Arthritis   . Blood in stool   . Cancer ([place])   . Chicken pox   . Diabetes mellitus 2000  . Dry skin   . GERD (gastroesophageal reflux disease)   . Hiatal hernia   . Hypertension   . Kidney infection 2014  . Ovarian cancer ([place]) 1991   s/p hysterectomy at [facility]  . Pernicious anemia   . Spinal stenosis   . Ulcer   . Urinary incontinence     Surgical History: Past Surgical History:  Procedure Laterality Date  . ABDOMINAL HYSTERECTOMY    . FOOT SURGERY  2000  . SHOULDER SURGERY  2013   fracture right shoulder    Home  Medications:  Allergies as of 12/28/2019      Reactions   Aspirin Other (See Comments)   bleeding   Sulfa Antibiotics Swelling   Oxybutynin Other (See Comments)   Dry mouth      Medication List       Accurate as of December 28, 2019  7:56 PM. If you have any questions, ask your nurse or doctor.        STOP taking these medications   trospium 20 MG tablet Commonly known as: SANCTURA Stopped by: Vicie Cech, PA-C   Trospium Chloride 60 MG Cp24 Stopped by: Knoxx Boeding, PA-C     TAKE these medications   Accu-Chek Aviva Plus test strip Generic drug: glucose blood USE 1 STRIP TO CHECK GLUCOSE THREE TIMES DAILY   Accu-Chek Softclix Lancets lancets 3 (three) times daily. Use as instructed. E11.42   cholecalciferol 1000 units tablet Commonly known as: VITAMIN D Take 4,000 Units by mouth daily.   cyanocobalamin 1000 MCG/ML injection Commonly known as: (VITAMIN B-12) Inject 1,000 mcg into the muscle every 30 (thirty) days.   ferrous sulfate 324 (65 Fe) MG Tbec Take 1 tablet (325 mg total) by mouth daily.   Fifty50 Glucose Meter 2.0 w/Device Kit Use as directed   glipiZIDE 10 MG tablet Commonly known as: GLUCOTROL  Take 1 tablet by mouth 2 (two) times a day.   lisinopril 5 MG tablet Commonly known as: ZESTRIL Take 1 tablet (5 mg total) by mouth daily.   metFORMIN 1000 MG tablet Commonly known as: GLUCOPHAGE Take 1 tablet (1,000 mg total) by mouth 2 (two) times daily with a meal.   mometasone 0.1 % lotion Commonly known as: ELOCON APP TOPICALLY TO SKIN ON SCALP ONCE D   nystatin cream Commonly known as: MYCOSTATIN Apply 1 application topically 2 (two) times daily.   pantoprazole 40 MG tablet Commonly known as: PROTONIX Take 1 tablet (40 mg total) by mouth daily.   pioglitazone 15 MG tablet Commonly known as: ACTOS Take 1 tablet by mouth daily.   Premarin vaginal cream Generic drug: conjugated estrogens Place 1 Applicatorful vaginally daily. Apply  0.21m (pea-sized amount)  just inside the vaginal introitus with a finger-tip on  Monday, Wednesday and Friday nights. Started by: SZara Council PA-C       Allergies:  Allergies  Allergen Reactions  . Aspirin Other (See Comments)    bleeding  . Sulfa Antibiotics Swelling  . Oxybutynin Other (See Comments)    Dry mouth    Family History: Family History  Problem Relation Age of Onset  . Arthritis Mother   . Cancer Mother        colon  . Arthritis Father   . Cancer Father        colon  . Cancer Sister        lung  . Cancer Other        lung    Social History:  reports that she has never smoked. She has never used smokeless tobacco. She reports that she does not drink alcohol or use drugs.  ROS: Pertinent ROS in HPI  Physical Exam: BP 114/71   Pulse 86   Ht _0  (1.626 m)   Wt 196 lb (88.9 kg)   BMI 33.64 kg/m Constitutional:  Well nourished. Alert and oriented, No acute distress. HEENT: [place] AT, mask in place.  Trachea midline Cardiovascular: No clubbing, cyanosis, or edema. Respiratory: Normal respiratory effort, no increased work of breathing. GI: Abdomen is soft, non tender, non distended, no abdominal masses.  GU: No CVA tenderness.  No bladder fullness or masses.  Atrophic external genitalia, sparse pubic hair distribution, no lesions.  Normal urethral meatus, no lesions, no prolapse, no discharge.   No urethral masses, tenderness and/or tenderness. No bladder fullness, tenderness or masses. Pale vagina mucosa, poor estrogen effect, no discharge, no lesions, fair pelvic support, no cystocele and no rectocele noted. Cervix, uterus and adnexa are surgically absent.  Anus and perineum are without rashes or lesions.     Skin: No rashes, bruises or suspicious lesions. Lymph: No inguinal adenopathy. Neurologic: Grossly intact, no focal deficits, moving all 4 extremities. Psychiatric: Normal mood and affect.    Laboratory Data: Lab Results  Component Value Date    WBC 5.5 02/21/2018   HGB 11.4 02/21/2018   HCT 34.2 02/21/2018   MCV 100 (H) 02/21/2018   PLT 296 02/21/2018    Lab Results  Component Value Date   CREATININE 0.93 04/11/2016    Lab Results  Component Value Date   HGBA1C 7.9 11/16/2016    Lab Results  Component Value Date   TSH 1.63 07/09/2013       Component Value Date/Time   CHOL 149 08/18/2015 1359   HDL 56.90 08/18/2015 1359   CHOLHDL 3 08/18/2015 1359   VLDL  19.8 08/18/2015 1359   LDLCALC 72 08/18/2015 1359    Lab Results  Component Value Date   AST 21 10/25/2015   Lab Results  Component Value Date   ALT 16 10/25/2015    Urinalysis Component     Latest Ref Rng & Units 12/28/2019  Color, Urine     YELLOW YELLOW  Appearance     CLEAR CLOUDY (A)  Specific Gravity, Urine     1.005 - 1.030 1.025  pH     5.0 - 8.0 5.0  Glucose, UA     NEGATIVE mg/dL 250 (A)  Hgb urine dipstick     NEGATIVE NEGATIVE  Bilirubin Urine     NEGATIVE NEGATIVE  Ketones, ur     NEGATIVE mg/dL TRACE (A)  Protein     NEGATIVE mg/dL TRACE (A)  Nitrite     NEGATIVE POSITIVE (A)  Leukocytes,Ua     NEGATIVE SMALL (A)  Squamous Epithelial / LPF     0 - 5 0-5  WBC, UA     0 - 5 WBC/hpf >50  RBC / HPF     0 - 5 RBC/hpf 0-5  Bacteria, UA     NONE SEEN MANY (A)     I have reviewed the labs.   Pertinent Imaging: Results for RHYLIE, STEHR (MRN [id number]) as of 12/28/2019 19:48  Ref. Range 12/28/2019 14:37  Scan Result Unknown 52    Assessment & Plan:    1. OAB (overactive bladder) - Bladder Scan (Post Void Residual) in office - could not tolerate OAB medications due to side effect of "crazy dreams" - will pursue non medical treatment - explained the PTNS provides treatment by indirectly providing electrical stimulation to the nerves responsible for bladder and pelvic floor function - a needle electrode generates an adjustable electrical pulse that travels to the sacral plexus via the tibial nerve which is located in  the ankle, among other functions, the sacral nerve plexus regulates bladder and pelvic floor function - treatment protocol requires once-a-week treatments for 12 weeks, 30 minutes per session and many patients begin to see improvements by the 6th treatment. Patients who respond to treatment may require occasional treatments (~ once every 3 weeks) to sustain improvements. PTNS is a low-risk procedure. The most common side-effects with PTNS treatment are temporary and minor, resulting from the placement of the needle electrode. They include minor bleeding, mild pain and skin inflammation and patients have seen up to an 80% success rate with this form of treatment - RTC for PTNS  2. Vaginal atrophy - likely the cause of her "dysuria" - will start vaginal estrogen cream three night weekly - a prescription of vaginal estrogen cream (Premarin vaginal cream) is sent to her pharmacy and she is instructed to apply 0.64m (pea-sized amount)  just inside the vaginal introitus with a finger-tip on Monday, Wednesday and Friday nights - She will follow up in three months for an exam.    Return for schedule PTNS .  These notes generated with voice recognition software. I apologize for typographical errors.  SZara Council PA-C  [facility] [address] [place] [phone]

## 2019-12-28 NOTE — Patient Instructions (Signed)
I have given you a prescriptions for a vaginal estrogen cream, Premarin.  Please apply 0.5mg  (pea-sized amount)  just inside the vaginal introitus with a finger-tip on Monday, Wednesday and Friday nights,

## 2020-01-04 ENCOUNTER — Ambulatory Visit (INDEPENDENT_AMBULATORY_CARE_PROVIDER_SITE_OTHER): Payer: Medicare Other | Admitting: Urology

## 2020-01-04 ENCOUNTER — Other Ambulatory Visit: Payer: Self-pay

## 2020-01-04 DIAGNOSIS — N3281 Overactive bladder: Secondary | ICD-10-CM | POA: Diagnosis not present

## 2020-01-04 NOTE — Progress Notes (Signed)
PTNS  Session # 1  Health & Social Factors: No change Caffeine: 0 Alcohol: 0 Daytime voids #per day: 5-6 Night-time voids #per night: 5-6 Urgency: Strong Incontinence Episodes #per day: 5-6 Ankle used: Left Treatment Setting: 12 Feeling/ Response: Sensory Comments: She tolerated the procedure well.    Performed By: Zara Council, PA-C  Assistant: Elberta Leatherwood, CMA  Follow Up: One week for # 2 PTNS  MRI w/wo contrast 10/2017 Several unilocular cystic pancreatic lesions scattered throughout the pancreas without high risk MRI features, largest 2.6 cm in the anterior pancreatic head, very mildly increased since 2015 CT abdomen study. Benign side branch IPMNs are suspected. MRI abdomen without and with IV contrast is recommended in 2 years. This recommendation follows ACR consensus guidelines: Management of Incidental Pancreatic Cysts: A White Paper of the ACR Incidental Findings Committee. [place] [id number].  I notified patient of these findings and gave her a copy of the report.  I also sent a message to her PCP, Dr. Juline Patch, regarding the findings.

## 2020-01-11 ENCOUNTER — Other Ambulatory Visit: Payer: Self-pay

## 2020-01-11 ENCOUNTER — Ambulatory Visit (INDEPENDENT_AMBULATORY_CARE_PROVIDER_SITE_OTHER): Payer: Medicare Other | Admitting: Urology

## 2020-01-11 DIAGNOSIS — N3281 Overactive bladder: Secondary | ICD-10-CM | POA: Diagnosis not present

## 2020-01-11 NOTE — Progress Notes (Signed)
PTNS  Session # 2  Health & Social Factors: no change Caffeine: 0 Alcohol: 0 Daytime voids #per day: 5-6 Night-time voids #per night: 5-6 Urgency: srong Incontinence Episodes #per day: 5-6 Ankle used: left Treatment Setting: 16 Feeling/ Response: sensory Comments: patient tolerated well  Preformed By: Elberta Leatherwood, CMA  Follow Up: 1 week #3

## 2020-01-17 ENCOUNTER — Other Ambulatory Visit: Payer: Self-pay | Admitting: Family Medicine

## 2020-01-17 DIAGNOSIS — I10 Essential (primary) hypertension: Secondary | ICD-10-CM

## 2020-01-17 NOTE — Telephone Encounter (Signed)
Requested Prescriptions  Pending Prescriptions Disp Refills  . lisinopril (ZESTRIL) 5 MG tablet [Pharmacy Med Name: LISINOPRIL 5MG  TABLETS] 90 tablet 0    Sig: TAKE 1 TABLET(5 MG) BY MOUTH DAILY     Cardiovascular:  ACE Inhibitors Failed - 01/17/2020  3:16 PM      Failed - Cr in normal range and within 180 days    Creatinine  Date Value Ref Range Status  05/08/2014 0.97 0.60 - 1.30 mg/dL Final   Creatinine, Ser  Date Value Ref Range Status  04/11/2016 0.93 0.57 - 1.00 mg/dL Final   Creatinine,U  Date Value Ref Range Status  02/16/2015 111.6 mg/dL Final         Failed - K in normal range and within 180 days    Potassium  Date Value Ref Range Status  04/11/2016 5.3 (H) 3.5 - 5.2 mmol/L Final  05/08/2014 3.7 3.5 - 5.1 mmol/L Final         Passed - Patient is not pregnant      Passed - Last BP in normal range    BP Readings from Last 1 Encounters:  12/28/19 114/71         Passed - Valid encounter within last 6 months    Recent Outpatient Visits          3 months ago Overactive bladder   [facility] Juline Patch, MD   7 months ago Candidiasis of female genitalia   [facility] Juline Patch, MD   10 months ago History of recurrent UTI (urinary tract infection)   [facility] Juline Patch, MD   1 year ago [place], [place]   1 year ago Acute maxillary sinusitis, recurrence not specified   [facility] Juline Patch, MD      Future Appointments            In 2 days Billey Co, MD [place]   In 1 week Laneta Simmers [place]   In 4 weeks [place], Gordan Payment [place]   In 1 month McGowan, Gordan Payment [place]   In 1 month McGowan, Shannon A, [place]

## 2020-01-19 ENCOUNTER — Encounter: Payer: Self-pay | Admitting: Urology

## 2020-01-19 ENCOUNTER — Ambulatory Visit (INDEPENDENT_AMBULATORY_CARE_PROVIDER_SITE_OTHER): Payer: Medicare Other | Admitting: Urology

## 2020-01-19 ENCOUNTER — Other Ambulatory Visit: Payer: Self-pay

## 2020-01-19 DIAGNOSIS — N3281 Overactive bladder: Secondary | ICD-10-CM | POA: Diagnosis not present

## 2020-01-25 ENCOUNTER — Other Ambulatory Visit: Payer: Self-pay

## 2020-01-25 ENCOUNTER — Ambulatory Visit (INDEPENDENT_AMBULATORY_CARE_PROVIDER_SITE_OTHER): Payer: Medicare Other | Admitting: Urology

## 2020-01-25 DIAGNOSIS — N3281 Overactive bladder: Secondary | ICD-10-CM | POA: Diagnosis not present

## 2020-01-25 NOTE — Progress Notes (Signed)
PTNS  Session # 4  Health & Social Factors: no change Caffeine: 0        Alcohol: 0 Daytime voids #per day: 5-6 Night-time voids #per night: 5-6 Urgency: strong Incontinence Episodes #per day: 5-6 Ankle used: left Treatment Setting: 16 Feeling/ Response: sensory Comments: patient tolerated well, had to go more posterior due to position  Preformed By: Edwin Dada, CMA  Assistant: Fonnie Jarvis, CMA  Follow Up: 1 week#4

## 2020-01-25 NOTE — Progress Notes (Signed)
PTNS  Session # 4  Health & Social Factors: no change Caffeine: 0  Alcohol: 0 Daytime voids #per day: 5-6 Night-time voids #per night: 5-6 Urgency: strong Incontinence Episodes #per day: 5-6 Ankle used: left Treatment Setting:3 Feeling/ Response: sensory Comments: patient tolerated well, had to go more posterior due to position  Preformed By: Edwin Dada, CMA  Assistant: Fonnie Jarvis, CMA  Follow Up: 1 week#5

## 2020-01-31 ENCOUNTER — Ambulatory Visit
Admission: EM | Admit: 2020-01-31 | Discharge: 2020-01-31 | Disposition: A | Discharge status: Home / Self Care | Payer: Medicare Other | Attending: Urgent Care | Admitting: Urgent Care

## 2020-01-31 ENCOUNTER — Encounter: Payer: Self-pay | Admitting: Emergency Medicine

## 2020-01-31 ENCOUNTER — Other Ambulatory Visit: Payer: Self-pay

## 2020-01-31 DIAGNOSIS — N390 Urinary tract infection, site not specified: Secondary | ICD-10-CM | POA: Diagnosis not present

## 2020-01-31 LAB — URINALYSIS, COMPLETE (UACMP) WITH MICROSCOPIC
Bilirubin Urine: NEGATIVE
Glucose, UA: 100 mg/dL — AB
Hgb urine dipstick: NEGATIVE
Nitrite: POSITIVE — AB
Specific Gravity, Urine: 1.02 (ref 1.005–1.030)
WBC, UA: >50 WBC/hpf (ref 0–5)
pH: 5 (ref 5.0–8.0)

## 2020-01-31 MED ORDER — CEPHALEXIN 500 MG PO CAPS: 500.0000 mg | ORAL_CAPSULE | Freq: Two times a day (BID) | ORAL | 0 refills | Status: DC

## 2020-01-31 NOTE — ED Provider Notes (Signed)
[facility]    CSN: [id number] Arrival date & time: 01/31/20  8811      History   Chief Complaint Chief Complaint  Patient presents with  . Pruritis    HPI Brenda [name] is a 86 y.o. female.   HPI  86 year old female presents with urinary frequency urgency and mild comfort when initiating micturition.  Started Tuesday 5 days prior to this visit.  She is also complaining of itchy dry skin that she has had for the past 3 to 4 days.  This is not a new problem that she has had this in the past.  She has been prescribed creams which were too expensive to purchase and Dr. Phillip Heal I have given her a jar of cream for $35 but she has not had a chance to refill it.  She denies any fever or chills.  She has had no nausea or vomiting.  She has a history of frequent UTIs.  I reviewed her medical records and they have been Slee Klebsiella but on occasion E. Coli.       Past Medical History:  Diagnosis Date  . Arthritis   . Blood in stool   . Cancer ([place])   . Chicken pox   . Diabetes mellitus 2000  . Dry skin   . GERD (gastroesophageal reflux disease)   . Hiatal hernia   . Hypertension   . Kidney infection 2014  . Ovarian cancer ([place]) 1991   s/p hysterectomy at [facility]  . Pernicious anemia   . Spinal stenosis   . Ulcer   . Urinary incontinence     Patient Active Problem List   Diagnosis Date Noted  . Chronic obstructive pulmonary disease ([place]) 05/29/2019  . Hypertension associated with type 2 diabetes mellitus ([place]) 03/09/2019  . Diabetic peripheral neuropathy associated with type 2 diabetes mellitus ([place]) 07/25/2018  . Persistent microalbuminuria associated with type 2 diabetes mellitus ([place]) 07/22/2018  . Screening for breast cancer 08/18/2015  . B12 deficiency 12/02/2014  . Lumbago 07/09/2014  . Shortness of breath 06/30/2014  . Obese 06/30/2014  . Microalbuminuria 06/10/2014  . Right facial pain 05/18/2014  . Anemia 01/01/2014  . Osteoarthritis 07/31/2013  .  Other malaise and fatigue 07/09/2013  . Muscle cramp, nocturnal 03/25/2013  . Increased frequency of urination 02/09/2013  . Urge incontinence 02/09/2013  . Medicare annual wellness visit, subsequent 12/02/2012  . Dermatitis 12/02/2012  . Other and unspecified hyperlipidemia 12/02/2012  . Essential hypertension, benign 12/02/2012  . Chronic low back pain 08/28/2012  . Osteoporosis 07/23/2012  . Nocturia 06/26/2012  . Paresthesia of bilateral legs 05/30/2012  . Pernicious anemia 05/30/2012  . Type 2 diabetes mellitus with hyperglycemia, without long-term current use of insulin ([place]) 05/30/2012  . Depression 05/30/2012  . Insomnia 05/30/2012  . Weakness of both legs 05/30/2012    Past Surgical History:  Procedure Laterality Date  . ABDOMINAL HYSTERECTOMY    . FOOT SURGERY  2000  . SHOULDER SURGERY  2013   fracture right shoulder    OB History   No obstetric history on file.      Home Medications    Prior to Admission medications   Medication Sig Start Date End Date Taking? Authorizing Provider  ACCU-CHEK AVIVA PLUS test strip USE 1 STRIP TO CHECK GLUCOSE THREE TIMES DAILY 12/09/18   [provider]  Accu-Chek Softclix Lancets lancets 3 (three) times daily. Use as instructed. E11.42 05/29/17   [provider]  Blood Glucose Monitoring Suppl (  FIFTY50 GLUCOSE METER 2.0) w/Device KIT Use as directed 11/03/18   [provider]  cephALEXin (KEFLEX) 500 MG capsule Take 1 capsule (500 mg total) by mouth 2 (two) times daily. 01/31/20   Lorin Picket, PA-C  cholecalciferol (VITAMIN D) 1000 units tablet Take 4,000 Units by mouth daily.     [provider]  conjugated estrogens (PREMARIN) vaginal cream Place 1 Applicatorful vaginally daily. Apply 0.25m (pea-sized amount)  just inside the vaginal introitus with a finger-tip on  Monday, Wednesday and Friday nights. 12/28/19   MZara CouncilA, PA-C  cyanocobalamin (,VITAMIN B-12,) 1000 MCG/ML injection  Inject 1,000 mcg into the muscle every 30 (thirty) days.    [provider]  ferrous sulfate 324 (65 Fe) MG TBEC Take 1 tablet (325 mg total) by mouth daily. 04/11/16   JJuline Patch MD  glipiZIDE (GLUCOTROL) 10 MG tablet Take 1 tablet by mouth 2 (two) times a day. 01/16/19   [provider]  lisinopril (ZESTRIL) 5 MG tablet TAKE 1 TABLET(5 MG) BY MOUTH DAILY 01/17/20   JJuline Patch MD  metFORMIN (GLUCOPHAGE) 1000 MG tablet Take 1 tablet (1,000 mg total) by mouth 2 (two) times daily with a meal. 04/11/16   JJuline Patch MD  mometasone (ELOCON) 0.1 % lotion APP TOPICALLY TO SKIN ON SCALP ONCE D 09/20/18   [provider]  nystatin cream (MYCOSTATIN) Apply 1 application topically 2 (two) times daily. 05/29/19   JJuline Patch MD  pantoprazole (PROTONIX) 40 MG tablet Take 1 tablet (40 mg total) by mouth daily. 04/11/16   JJuline Patch MD  pioglitazone (ACTOS) 15 MG tablet Take 1 tablet by mouth daily. 01/24/19   [provider]    Family History Family History  Problem Relation Age of Onset  . Arthritis Mother   . Cancer Mother        colon  . Arthritis Father   . Cancer Father        colon  . Cancer Sister        lung  . Cancer Other        lung    Social History Social History   Tobacco Use  . Smoking status: Never Smoker  . Smokeless tobacco: Never Used  . Tobacco comment: smoking cessation materials not required  Substance Use Topics  . Alcohol use: No  . Drug use: No     Allergies   Aspirin, Sulfa antibiotics, and Oxybutynin   Review of Systems Review of Systems  Constitutional: Positive for activity change. Negative for appetite change, chills, fatigue and fever.  Genitourinary: Positive for dysuria, frequency and urgency.  Skin: Positive for rash.  All other systems reviewed and are negative.    Physical Exam Triage Vital Signs ED Triage Vitals  Enc Vitals Group     BP 01/31/20 1006 (!) 151/84     Pulse Rate 01/31/20  1006 92     Resp 01/31/20 1006 14     Temp 01/31/20 1006 98.6 F (37 C)     Temp Source 01/31/20 1006 Oral     SpO2 01/31/20 1006 97 %     Weight 01/31/20 1002 195 lb (88.5 kg)     Height 01/31/20 1002 5' 4" (1.626 m)     Head Circumference --      Peak Flow --      Pain Score 01/31/20 1002 0     Pain Loc --      Pain Edu? --  Excl. in GC? --    No data found.  Updated Vital Signs BP (!) 151/84 (BP Location: Right Arm)   Pulse 92   Temp 98.6 F (37 C) (Oral)   Resp 14   Ht 5' 4" (1.626 m)   Wt 195 lb (88.5 kg)   SpO2 97%   BMI 33.47 kg/m   Visual Acuity Right Eye Distance:   Left Eye Distance:   Bilateral Distance:    Right Eye Near:   Left Eye Near:    Bilateral Near:     Physical Exam Vitals and nursing note reviewed.  Constitutional:      General: She is not in acute distress.    Appearance: Normal appearance. She is obese. She is not ill-appearing or toxic-appearing.  HENT:     Head: Normocephalic and atraumatic.  Eyes:     Conjunctiva/sclera: Conjunctivae normal.  Musculoskeletal:        General: Normal range of motion.     Cervical back: Normal range of motion and neck supple.  Skin:    General: Skin is warm and dry.     Comments: Examination of the skin shows no significant rashes.  She has areas of excoriation.  She does have one reddened area over the medial elbow but this is isolated and appears to be perhaps a mosquito bite.  Neurological:     General: No focal deficit present.     Mental Status: She is alert and oriented to person, place, and time.  Psychiatric:        Mood and Affect: Mood normal.        Behavior: Behavior normal.        Thought Content: Thought content normal.        Judgment: Judgment normal.      UC Treatments / Results  Labs (all labs ordered are listed, but only abnormal results are displayed) Labs Reviewed  URINALYSIS, COMPLETE (UACMP) WITH MICROSCOPIC - Abnormal; Notable for the following components:       Result Value   APPearance HAZY (*)    Glucose, UA 100 (*)    Ketones, ur TRACE (*)    Protein, ur TRACE (*)    Nitrite POSITIVE (*)    Leukocytes,Ua MODERATE (*)    Bacteria, UA MANY (*)    All other components within normal limits  URINE CULTURE    EKG   Radiology No results found.  Procedures Procedures (including critical care time)  Medications Ordered in UC Medications - No data to display  Initial Impression / Assessment and Plan / UC Course  I have reviewed the triage vital signs and the nursing notes.  Pertinent labs & imaging results that were available during my care of the patient were reviewed by me and considered in my medical decision making (see chart for details).   86 year old female presents with urinary frequency urgency and mild dysuria that she has had for 5 days.  History of UTIs in the past.  She has had no nausea vomiting or fever or chills.  Urinalysis showed moderate leukocytes positive nitrites no red blood cells and greater than 50 white blood cells with clumping present.  I have cultured the urine.  Place her on Keflex 500 mg twice daily for 7 days due to her age and recurrent UTIs.  Further treatment will be guided by the cultures and sensitivities.  For her itching it sounds as if she has winter itch type of sensation.  I have told her  to use high-quality emollients and have given her the names of CeraVe Aveeno or Lubriderm.  She is going to see Dr. Phillip Heal shortly to obtain a another jar of the cream that she prescribed which was very effective.   Final Clinical Impressions(s) / UC Diagnoses   Final diagnoses:  Urinary tract infection without hematuria, site unspecified   Discharge Instructions   None    ED Prescriptions    Medication Sig Dispense Auth. Provider   cephALEXin (KEFLEX) 500 MG capsule Take 1 capsule (500 mg total) by mouth 2 (two) times daily. 14 capsule Lorin Picket, PA-C     PDMP not reviewed this encounter.     Lorin Picket, PA-C 01/31/20 1109

## 2020-01-31 NOTE — ED Triage Notes (Signed)
Patient c/o itchy skin and dry skin for the past 3-4 days.  Patient c/o urinary frequency since Tuesday.

## 2020-02-02 LAB — URINE CULTURE: Culture: >=100000 — AB

## 2020-02-15 ENCOUNTER — Encounter: Payer: Self-pay | Admitting: Urology

## 2020-02-15 ENCOUNTER — Other Ambulatory Visit: Payer: Self-pay

## 2020-02-15 ENCOUNTER — Ambulatory Visit (INDEPENDENT_AMBULATORY_CARE_PROVIDER_SITE_OTHER): Payer: Medicare Other | Admitting: Urology

## 2020-02-15 DIAGNOSIS — N3281 Overactive bladder: Secondary | ICD-10-CM

## 2020-02-15 NOTE — Patient Instructions (Signed)
Tracking Your Bladder Symptoms    Patient Name:___________________________________________________   Sample: Day   Daytime Voids  Nighttime Voids Urgency for the Day(0-4) Number of Accidents Beverage Comments  Monday IIII II 2 I Water IIII Coffee  I      Week Starting:____________________________________   Day Daytime  Voids Nighttime  Voids Urgency for  The Day(0-4) Number of Accidents Beverages Comments                                                           This week my symptoms were:  O much better  O better O the same O worse   

## 2020-02-15 NOTE — Progress Notes (Signed)
Patient ID: Brenda [name], female   DOB: December 29, 1930, 86 y.o.   MRN: [id number] PTNS  Session # 5  Health & Social Factors: no change Caffeine: 1-2 Alcohol: 0 Daytime voids #per day: 4-5 Night-time voids #per night: 1-5 Urgency: mild Incontinence Episodes #per day: 1-2 Ankle used: right Treatment Setting: 4 Feeling/ Response: both  Comments: patient tolerated well  Preformed By: Edwin Dada, CMA    Follow Up: 1 week for #6

## 2020-02-18 ENCOUNTER — Encounter: Payer: Self-pay | Admitting: Family Medicine

## 2020-02-18 ENCOUNTER — Other Ambulatory Visit: Payer: Self-pay

## 2020-02-18 ENCOUNTER — Ambulatory Visit (INDEPENDENT_AMBULATORY_CARE_PROVIDER_SITE_OTHER): Payer: Medicare Other | Admitting: Family Medicine

## 2020-02-18 VITALS — BP 126/80 | HR 80 | Ht 64.0 in | Wt 192.0 lb

## 2020-02-18 DIAGNOSIS — L509 Urticaria, unspecified: Secondary | ICD-10-CM

## 2020-02-18 DIAGNOSIS — E538 Deficiency of other specified B group vitamins: Secondary | ICD-10-CM

## 2020-02-18 MED ORDER — CYANOCOBALAMIN 1000 MCG/ML IJ SOLN
1000.0000 ug | Freq: Once | INTRAMUSCULAR | Status: AC
  Administered 2020-02-18: 1000 ug via INTRAMUSCULAR

## 2020-02-18 MED ORDER — MONTELUKAST SODIUM 10 MG PO TABS: 10.0000 mg | ORAL_TABLET | Freq: Every day | ORAL | 1 refills | Status: DC

## 2020-02-18 NOTE — Patient Instructions (Signed)
Hives Hives are itchy, red, swollen areas on your skin. Hives can show up on any part of your body. Hives often fade within 24 hours (acute hives). New hives can show up after old ones fade. This can go on for many days or weeks (chronic hives). Hives do not spread from person to person (are not contagious). Hives are caused by your body's response to something that you are allergic to (allergen). These are sometimes called triggers. You can get hives right after being around a trigger, or hours later. What are the causes?  Allergies to foods.  Insect bites or stings.  Pollen.  Pets.  Latex.  Chemicals.  Spending time in sunlight, heat, or cold.  Exercise.  Stress.  Some medicines.  Viruses. This includes the common cold.  Infections caused by germs (bacteria).  Allergy shots.  Blood transfusions. Sometimes, the cause is not known. What increases the risk?  Being a woman.  Being allergic to foods such as: ? Citrus fruits. ? Milk. ? Eggs. ? Peanuts. ? Tree nuts. ? Shellfish.  Being allergic to: ? Medicines. ? Latex. ? Insects. ? Animals. ? Pollen. What are the signs or symptoms?   Raised, itchy, red or white bumps or patches on your skin. These areas may: ? Get large and swollen. ? Change in shape and location. ? Stand alone or connect to each other over a large area of skin. ? Sting or hurt. ? Turn white when pressed in the center (blanch). In very bad cases, your hands, feet, and face may also get swollen. This may happen if hives start deeper in your skin. How is this treated? Treatment for this condition depends on your symptoms. Treatment may include:  Using cool, wet cloths (cool compresses) or taking cool showers to stop the itching.  Medicines that help: ? Relieve itching (antihistamines). ? Reduce swelling (corticosteroids). ? Treat infection (antibiotics).  A medicine (omalizumab) that is given as a shot (injection). Your doctor may  prescribe this if you have hives that do not get better even after other treatments.  In very bad cases, you may need a shot of a medicine called epinephrineto prevent a life-threatening allergic reaction (anaphylaxis). Follow these instructions at home: Medicines  Take or apply over-the-counter and prescription medicines only as told by your doctor.  If you were prescribed an antibiotic medicine, use it as told by your doctor. Do not stop using it even if you start to feel better. Skin care  Apply cool, wet cloths to the hives.  Do not scratch your skin. Do not rub your skin. General instructions  Do not take hot showers or baths. This can make itching worse.  Do not wear tight clothes.  Use sunscreen and wear clothes that cover your skin when you are outside.  Avoid any triggers that cause your hives. Keep a journal to help track what causes your hives. Write down: ? What medicines you take. ? What you eat and drink. ? What products you use on your skin.  Keep all follow-up visits as told by your doctor. This is important. Contact a doctor if:  Your symptoms are not better with medicine.  Your joints hurt or are swollen. Get help right away if:  You have a fever.  You have pain in your belly (abdomen).  Your tongue or lips are swollen.  Your eyelids are swollen.  Your chest or throat feels tight.  You have trouble breathing or swallowing. These symptoms may be an emergency.  Do not wait to see if the symptoms will go away. Get medical help right away. Call your local emergency services (911 in the [place] Do not drive yourself to the hospital. Summary  Hives are itchy, red, swollen areas on your skin.  Treatment for this condition depends on your symptoms.  Avoid things that cause your hives. Keep a journal to help track what causes your hives.  Take and apply over-the-counter and prescription medicines only as told by your doctor.  Keep all follow-up visits  as told by your doctor. This is important. This information is not intended to replace advice given to you by your health care provider. Make sure you discuss any questions you have with your health care provider. Document Revised: 03/12/2018 Document Reviewed: 03/12/2018 Elsevier Patient Education  2020 Elsevier Inc[employer]  

## 2020-02-18 NOTE — Progress Notes (Signed)
Date:  02/18/2020   Name:  Brenda [name]   DOB:  1931/08/01   MRN:  [id number]   Chief Complaint: Rash (stinging and itching all over)  Rash This is a new problem. The problem has been waxing and waning since onset. The rash is diffuse. The rash is characterized by burning. Pertinent negatives include no congestion, cough, diarrhea, eye pain, fatigue, fever, rhinorrhea, shortness of breath, sore throat or vomiting.    Lab Results  Component Value Date   CREATININE 0.93 04/11/2016   BUN 16 04/11/2016   NA 139 04/11/2016   K 5.3 (H) 04/11/2016   CL 99 04/11/2016   CO2 26 04/11/2016   Lab Results  Component Value Date   CHOL 149 08/18/2015   HDL 56.90 08/18/2015   LDLCALC 72 08/18/2015   TRIG 99.0 08/18/2015   CHOLHDL 3 08/18/2015   Lab Results  Component Value Date   TSH 1.63 07/09/2013   Lab Results  Component Value Date   HGBA1C 7.9 11/16/2016   Lab Results  Component Value Date   WBC 5.5 02/21/2018   HGB 11.4 02/21/2018   HCT 34.2 02/21/2018   MCV 100 (H) 02/21/2018   PLT 296 02/21/2018   Lab Results  Component Value Date   ALT 16 10/25/2015   AST 21 10/25/2015   ALKPHOS 72 10/25/2015   BILITOT 0.2 05/07/2018     Review of Systems  Constitutional: Negative.  Negative for chills, fatigue, fever and unexpected weight change.  HENT: Negative for congestion, ear discharge, ear pain, rhinorrhea, sinus pressure, sneezing and sore throat.   Eyes: Negative for photophobia, pain, discharge, redness and itching.  Respiratory: Negative for cough, shortness of breath, wheezing and stridor.   Gastrointestinal: Negative for abdominal pain, blood in stool, constipation, diarrhea, nausea and vomiting.  Endocrine: Negative for cold intolerance, heat intolerance, polydipsia, polyphagia and polyuria.  Genitourinary: Negative for dysuria, flank pain, frequency, hematuria, menstrual problem, pelvic pain, urgency, vaginal bleeding and vaginal discharge.  Musculoskeletal:  Negative for arthralgias, back pain and myalgias.  Skin: Positive for rash.  Allergic/Immunologic: Negative for environmental allergies and food allergies.  Neurological: Negative for dizziness, weakness, light-headedness, numbness and headaches.  Hematological: Negative for adenopathy. Does not bruise/bleed easily.  Psychiatric/Behavioral: Negative for dysphoric mood. The patient is not nervous/anxious.     Patient Active Problem List   Diagnosis Date Noted  . Chronic obstructive pulmonary disease ([place]) 05/29/2019  . Hypertension associated with type 2 diabetes mellitus ([place]) 03/09/2019  . Diabetic peripheral neuropathy associated with type 2 diabetes mellitus ([place]) 07/25/2018  . Persistent microalbuminuria associated with type 2 diabetes mellitus ([place]) 07/22/2018  . Screening for breast cancer 08/18/2015  . B12 deficiency 12/02/2014  . Lumbago 07/09/2014  . Shortness of breath 06/30/2014  . Obese 06/30/2014  . Microalbuminuria 06/10/2014  . Right facial pain 05/18/2014  . Anemia 01/01/2014  . Osteoarthritis 07/31/2013  . Other malaise and fatigue 07/09/2013  . Muscle cramp, nocturnal 03/25/2013  . Increased frequency of urination 02/09/2013  . Urge incontinence 02/09/2013  . Medicare annual wellness visit, subsequent 12/02/2012  . Dermatitis 12/02/2012  . Other and unspecified hyperlipidemia 12/02/2012  . Essential hypertension, benign 12/02/2012  . Chronic low back pain 08/28/2012  . Osteoporosis 07/23/2012  . Nocturia 06/26/2012  . Paresthesia of bilateral legs 05/30/2012  . Pernicious anemia 05/30/2012  . Type 2 diabetes mellitus with hyperglycemia, without long-term current use of insulin ([place]) 05/30/2012  . Depression 05/30/2012  . Insomnia 05/30/2012  .  Weakness of both legs 05/30/2012    Allergies  Allergen Reactions  . Aspirin Other (See Comments)    bleeding  . Sulfa Antibiotics Swelling  . Oxybutynin Other (See Comments)    Dry mouth    Past Surgical  History:  Procedure Laterality Date  . ABDOMINAL HYSTERECTOMY    . FOOT SURGERY  2000  . SHOULDER SURGERY  2013   fracture right shoulder    Social History   Tobacco Use  . Smoking status: Never Smoker  . Smokeless tobacco: Never Used  . Tobacco comment: smoking cessation materials not required  Vaping Use  . Vaping Use: Never used  Substance Use Topics  . Alcohol use: No  . Drug use: No     Medication list has been reviewed and updated.  Current Meds  Medication Sig  . ACCU-CHEK AVIVA PLUS test strip USE 1 STRIP TO CHECK GLUCOSE THREE TIMES DAILY  . Accu-Chek Softclix Lancets lancets 3 (three) times daily. Use as instructed. E11.42  . Blood Glucose Monitoring Suppl (FIFTY50 GLUCOSE METER 2.0) w/Device KIT Use as directed  . cholecalciferol (VITAMIN D) 1000 units tablet Take 4,000 Units by mouth daily.   Marland Kitchen conjugated estrogens (PREMARIN) vaginal cream Place 1 Applicatorful vaginally daily. Apply 0.'5mg'$  (pea-sized amount)  just inside the vaginal introitus with a finger-tip on  Monday, Wednesday and Friday nights.  . cyanocobalamin (,VITAMIN B-12,) 1000 MCG/ML injection Inject 1,000 mcg into the muscle every 30 (thirty) days.  . ferrous sulfate 324 (65 Fe) MG TBEC Take 1 tablet (325 mg total) by mouth daily.  Marland Kitchen glipiZIDE (GLUCOTROL) 10 MG tablet Take 1 tablet by mouth 2 (two) times a day.  . lisinopril (ZESTRIL) 5 MG tablet TAKE 1 TABLET(5 MG) BY MOUTH DAILY  . metFORMIN (GLUCOPHAGE) 1000 MG tablet Take 1 tablet (1,000 mg total) by mouth 2 (two) times daily with a meal.  . nystatin cream (MYCOSTATIN) Apply 1 application topically 2 (two) times daily.  . pantoprazole (PROTONIX) 40 MG tablet Take 1 tablet (40 mg total) by mouth daily.  . pioglitazone (ACTOS) 15 MG tablet Take 1 tablet by mouth daily.  . [DISCONTINUED] mometasone (ELOCON) 0.1 % lotion APP TOPICALLY TO SKIN ON SCALP ONCE D   Current Facility-Administered Medications for the 02/18/20 encounter (Office Visit) with  Juline Patch, MD  Medication  . ipratropium-albuterol (DUONEB) 0.5-2.5 (3) MG/3ML nebulizer solution 3 mL    PHQ 2/9 Scores 02/18/2020 03/09/2019 02/19/2018 02/18/2017  PHQ - 2 Score 0 2 0 0  PHQ- 9 Score 0 6 0 -    BP Readings from Last 3 Encounters:  02/18/20 126/80  01/31/20 (!) 151/84  12/28/19 114/71    Physical Exam Vitals and nursing note reviewed.  Skin:    Capillary Refill: Capillary refill takes less than 2 seconds.     Comments: No rash     Wt Readings from Last 3 Encounters:  02/18/20 192 lb (87.1 kg)  01/31/20 195 lb (88.5 kg)  12/28/19 196 lb (88.9 kg)    BP 126/80   Pulse 80   Ht '5\' 4"'$  (1.626 m)   Wt 192 lb (87.1 kg)   BMI 32.96 kg/m   Assessment and Plan:  1. Urticaria New onset.  Persistent.  Partially controlled.  Patient is continuing her Benadryl.  Patient has yet to pick up a compound cream that she has received from Dr. Ronnald Ramp.  She has been encouraged to do so.  Patient's medications were reviewed for photosensitivity and other possibilities  of drug reactions and nothing beyond her chronic medications are present at this time.  I have placed her on some Singulair for some leukotriene inhibition to see if this may also help if anything it may help some with her allergic rhinitis.

## 2020-02-22 ENCOUNTER — Ambulatory Visit: Payer: Medicare Other | Admitting: Urology

## 2020-02-22 ENCOUNTER — Other Ambulatory Visit: Payer: Self-pay

## 2020-02-22 ENCOUNTER — Ambulatory Visit (INDEPENDENT_AMBULATORY_CARE_PROVIDER_SITE_OTHER): Payer: Medicare Other | Admitting: Urology

## 2020-02-22 DIAGNOSIS — N3281 Overactive bladder: Secondary | ICD-10-CM

## 2020-02-22 NOTE — Progress Notes (Signed)
Patient ID: Brenda [name], female   DOB: [birth year], 86 y.o.   MRN: [id number] PTNS  Session # 6  Health & Social Factors: no change Caffeine: 1  Alcohol: 0 Daytime voids #per day: 2-3 Night-time voids #per night: 3 Urgency: mild Incontinence Episodes #per day: 1 Ankle used: left Treatment Setting: 13 Feeling/ Response: sensory Comments: patient tolerated well  Preformed By: Edwin Dada, CMA  Follow Up: 1 week session 7

## 2020-02-29 ENCOUNTER — Other Ambulatory Visit: Payer: Self-pay

## 2020-02-29 ENCOUNTER — Ambulatory Visit: Payer: Medicare Other | Admitting: Urology

## 2020-02-29 ENCOUNTER — Ambulatory Visit (INDEPENDENT_AMBULATORY_CARE_PROVIDER_SITE_OTHER): Payer: Medicare Other | Admitting: Urology

## 2020-02-29 DIAGNOSIS — N3281 Overactive bladder: Secondary | ICD-10-CM | POA: Diagnosis not present

## 2020-02-29 NOTE — Progress Notes (Signed)
PTNS  Session # 7  Health & Social Factors: no change Caffeine: 1 Alcohol: 0 Daytime voids #per day: 2-3 Night-time voids #per night: 2 Urgency: mild Incontinence Episodes #per day: 1 (pt wears a pad) Ankle used: left Treatment Setting: 2 Feeling/ Response: both Comments: patient tolerated well  Preformed By: Edwin Dada, CMA   Follow Up: 1 week session 8

## 2020-03-06 NOTE — Progress Notes (Deleted)
PTNS  Session # ***  Health & Social Factors: *** Caffeine: *** Alcohol: *** Daytime voids #per day: *** Night-time voids #per night: *** Urgency: *** Incontinence Episodes #per day: *** Ankle used: *** Treatment Setting: *** Feeling/ Response: *** Comments: ***  Preformed By: ***  Assistant: ***  Follow Up: ***

## 2020-03-07 ENCOUNTER — Ambulatory Visit: Payer: Medicare Other | Admitting: Urology

## 2020-03-09 ENCOUNTER — Ambulatory Visit (INDEPENDENT_AMBULATORY_CARE_PROVIDER_SITE_OTHER): Payer: Medicare Other

## 2020-03-09 DIAGNOSIS — Z0000 Encounter for general adult medical examination without abnormal findings: Secondary | ICD-10-CM

## 2020-03-09 NOTE — Patient Instructions (Signed)
Brenda [name] , Thank you for taking time to come for your Medicare Wellness Visit. I appreciate your ongoing commitment to your health goals. Please review the following plan we discussed and let me know if I can assist you in the future.   Screening recommendations/referrals: Colonoscopy: no longer required Mammogram: no longer required Bone Density: no longer required Recommended yearly ophthalmology/optometry visit for glaucoma screening and checkup Recommended yearly dental visit for hygiene and checkup  Vaccinations: Influenza vaccine: done 06/30/19 Pneumococcal vaccine: done 01/01/14 Tdap vaccine: due Shingles vaccine: Shingrix discussed. Please contact your pharmacy for coverage information.  Covid-19: done 10/17/19 & 11/07/19  Advanced directives: Please bring a copy of your health care power of attorney and living will to the office at your convenience.  Conditions/risks identified: Recommend continuing to prevent falls.   Next appointment: Follow up in one year for your annual wellness visit    Preventive Care 65 Years and Older, Female Preventive care refers to lifestyle choices and visits with your health care provider that can promote health and wellness. What does preventive care include?  A yearly physical exam. This is also called an annual well check.  Dental exams once or twice a year.  Routine eye exams. Ask your health care provider how often you should have your eyes checked.  Personal lifestyle choices, including:  Daily care of your teeth and gums.  Regular physical activity.  Eating a healthy diet.  Avoiding tobacco and drug use.  Limiting alcohol use.  Practicing safe sex.  Taking low-dose aspirin every day.  Taking vitamin and mineral supplements as recommended by your health care provider. What happens during an annual well check? The services and screenings done by your health care provider during your annual well check will depend on your age,  overall health, lifestyle risk factors, and family history of disease. Counseling  Your health care provider may ask you questions about your:  Alcohol use.  Tobacco use.  Drug use.  Emotional well-being.  Home and relationship well-being.  Sexual activity.  Eating habits.  History of falls.  Memory and ability to understand (cognition).  Work and work Statistician.  Reproductive health. Screening  You may have the following tests or measurements:  Height, weight, and BMI.  Blood pressure.  Lipid and cholesterol levels. These may be checked every 5 years, or more frequently if you are over 37 years old.  Skin check.  Lung cancer screening. You may have this screening every year starting at age 52 if you have a 30-pack-year history of smoking and currently smoke or have quit within the past 15 years.  Fecal occult blood test (FOBT) of the stool. You may have this test every year starting at age 34.  Flexible sigmoidoscopy or colonoscopy. You may have a sigmoidoscopy every 5 years or a colonoscopy every 10 years starting at age 58.  Hepatitis C blood test.  Hepatitis B blood test.  Sexually transmitted disease (STD) testing.  Diabetes screening. This is done by checking your blood sugar (glucose) after you have not eaten for a while (fasting). You may have this done every 1-3 years.  Bone density scan. This is done to screen for osteoporosis. You may have this done starting at age 69.  Mammogram. This may be done every 1-2 years. Talk to your health care provider about how often you should have regular mammograms. Talk with your health care provider about your test results, treatment options, and if necessary, the need for more tests. Vaccines  Your health care provider may recommend certain vaccines, such as:  Influenza vaccine. This is recommended every year.  Tetanus, diphtheria, and acellular pertussis (Tdap, Td) vaccine. You may need a Td booster every 10  years.  Zoster vaccine. You may need this after age 65.  Pneumococcal 13-valent conjugate (PCV13) vaccine. One dose is recommended after age 14.  Pneumococcal polysaccharide (PPSV23) vaccine. One dose is recommended after age 45. Talk to your health care provider about which screenings and vaccines you need and how often you need them. This information is not intended to replace advice given to you by your health care provider. Make sure you discuss any questions you have with your health care provider. Document Released: 09/23/2015 Document Revised: 05/16/2016 Document Reviewed: 06/28/2015 Elsevier Interactive Patient Education  2017 [place] Prevention in the Home Falls can cause injuries. They can happen to people of all ages. There are many things you can do to make your home safe and to help prevent falls. What can I do on the outside of my home?  Regularly fix the edges of walkways and driveways and fix any cracks.  Remove anything that might make you trip as you walk through a door, such as a raised step or threshold.  Trim any bushes or trees on the path to your home.  Use bright outdoor lighting.  Clear any walking paths of anything that might make someone trip, such as rocks or tools.  Regularly check to see if handrails are loose or broken. Make sure that both sides of any steps have handrails.  Any raised decks and porches should have guardrails on the edges.  Have any leaves, snow, or ice cleared regularly.  Use sand or salt on walking paths during winter.  Clean up any spills in your garage right away. This includes oil or grease spills. What can I do in the bathroom?  Use night lights.  Install grab bars by the toilet and in the tub and shower. Do not use towel bars as grab bars.  Use non-skid mats or decals in the tub or shower.  If you need to sit down in the shower, use a plastic, non-slip stool.  Keep the floor dry. Clean up any water that  spills on the floor as soon as it happens.  Remove soap buildup in the tub or shower regularly.  Attach bath mats securely with double-sided non-slip rug tape.  Do not have throw rugs and other things on the floor that can make you trip. What can I do in the bedroom?  Use night lights.  Make sure that you have a light by your bed that is easy to reach.  Do not use any sheets or blankets that are too big for your bed. They should not hang down onto the floor.  Have a firm chair that has side arms. You can use this for support while you get dressed.  Do not have throw rugs and other things on the floor that can make you trip. What can I do in the kitchen?  Clean up any spills right away.  Avoid walking on wet floors.  Keep items that you use a lot in easy-to-reach places.  If you need to reach something above you, use a strong step stool that has a grab bar.  Keep electrical cords out of the way.  Do not use floor polish or wax that makes floors slippery. If you must use wax, use non-skid floor wax.  Do  not have throw rugs and other things on the floor that can make you trip. What can I do with my stairs?  Do not leave any items on the stairs.  Make sure that there are handrails on both sides of the stairs and use them. Fix handrails that are broken or loose. Make sure that handrails are as long as the stairways.  Check any carpeting to make sure that it is firmly attached to the stairs. Fix any carpet that is loose or worn.  Avoid having throw rugs at the top or bottom of the stairs. If you do have throw rugs, attach them to the floor with carpet tape.  Make sure that you have a light switch at the top of the stairs and the bottom of the stairs. If you do not have them, ask someone to add them for you. What else can I do to help prevent falls?  Wear shoes that:  Do not have high heels.  Have rubber bottoms.  Are comfortable and fit you well.  Are closed at the  toe. Do not wear sandals.  If you use a stepladder:  Make sure that it is fully opened. Do not climb a closed stepladder.  Make sure that both sides of the stepladder are locked into place.  Ask someone to hold it for you, if possible.  Clearly mark and make sure that you can see:  Any grab bars or handrails.  First and last steps.  Where the edge of each step is.  Use tools that help you move around (mobility aids) if they are needed. These include:  Canes.  Walkers.  Scooters.  Crutches.  Turn on the lights when you go into a dark area. Replace any light bulbs as soon as they burn out.  Set up your furniture so you have a clear path. Avoid moving your furniture around.  If any of your floors are uneven, fix them.  If there are any pets around you, be aware of where they are.  Review your medicines with your doctor. Some medicines can make you feel dizzy. This can increase your chance of falling. Ask your doctor what other things that you can do to help prevent falls. This information is not intended to replace advice given to you by your health care provider. Make sure you discuss any questions you have with your health care provider. Document Released: 06/23/2009 Document Revised: 02/02/2016 Document Reviewed: 10/01/2014 Elsevier Interactive Patient Education  2017 [employer].

## 2020-03-09 NOTE — Progress Notes (Signed)
Subjective:   Brenda [name] is a 86 y.o. female who presents for Medicare Annual (Subsequent) preventive examination.  Virtual Visit via Telephone Note  I connected with  Brenda [name] on 03/09/20 at  8:40 AM EDT by telephone and verified that I am speaking with the correct person using two identifiers.  Medicare Annual Wellness visit completed telephonically due to Covid-19 pandemic.   Location: Patient: home Provider: office   I discussed the limitations, risks, security and privacy concerns of performing an evaluation and management service by telephone and the availability of in person appointments. The patient expressed understanding and agreed to proceed.  Unable to perform video visit due to video visit attempted and failed and/or patient does not have video capability.   Some vital signs may be absent or patient reported.   Brenda Marker, LPN    Review of Systems     Cardiac Risk Factors include: advanced age (>19mn, >>72women);diabetes mellitus;hypertension;obesity (BMI >30kg/m2);sedentary lifestyle     Objective:    There were no vitals filed for this visit. There is no height or weight on file to calculate BMI.  Advanced Directives 03/09/2020 01/31/2020 03/11/2019 03/09/2019 07/16/2018 02/19/2018 02/18/2017  Does Patient Have a Medical Advance Directive? Yes No No Yes No No No  Type of AParamedicof [place]onLiving will - - HM[place]Living will - - -  Copy of [place] Chart? No - copy requested - - No - copy requested - - -  Would patient like information on creating a medical advance directive? - - - - - Yes (MAU/Ambulatory/Procedural Areas - Information given) Yes (MAU/Ambulatory/Procedural Areas - Information given)    Current Medications (verified) Outpatient Encounter Medications as of 03/09/2020  Medication Sig  . ACCU-CHEK AVIVA PLUS test strip USE 1 STRIP TO CHECK GLUCOSE THREE TIMES DAILY  .  Accu-Chek Softclix Lancets lancets 3 (three) times daily. Use as instructed. E11.42  . cholecalciferol (VITAMIN D) 1000 units tablet Take 4,000 Units by mouth daily.   . cyanocobalamin (,VITAMIN B-12,) 1000 MCG/ML injection Inject 1,000 mcg into the muscle every 30 (thirty) days.  . ferrous sulfate 324 (65 Fe) MG TBEC Take 1 tablet (325 mg total) by mouth daily.  .Marland KitchenglipiZIDE (GLUCOTROL) 10 MG tablet Take 1 tablet by mouth 2 (two) times a day.  . lisinopril (ZESTRIL) 5 MG tablet TAKE 1 TABLET(5 MG) BY MOUTH DAILY  . metFORMIN (GLUCOPHAGE) 1000 MG tablet Take 1 tablet (1,000 mg total) by mouth 2 (two) times daily with a meal.  . montelukast (SINGULAIR) 10 MG tablet Take 1 tablet (10 mg total) by mouth at bedtime.  .Marland Kitchennystatin cream (MYCOSTATIN) Apply 1 application topically 2 (two) times daily.  . pioglitazone (ACTOS) 15 MG tablet Take 1 tablet by mouth daily.  . pantoprazole (PROTONIX) 40 MG tablet Take 1 tablet (40 mg total) by mouth daily. (Patient not taking: Reported on 03/09/2020)  . [DISCONTINUED] Blood Glucose Monitoring Suppl (FIFTY50 GLUCOSE METER 2.0) w/Device KIT Use as directed  . [DISCONTINUED] conjugated estrogens (PREMARIN) vaginal cream Place 1 Applicatorful vaginally daily. Apply 0.511m(pea-sized amount)  just inside the vaginal introitus with a finger-tip on  Monday, Wednesday and Friday nights. (Patient not taking: Reported on 03/09/2020)   Facility-Administered Encounter Medications as of 03/09/2020  Medication  . ipratropium-albuterol (DUONEB) 0.5-2.5 (3) MG/3ML nebulizer solution 3 mL    Allergies (verified) Aspirin, Sulfa antibiotics, and Oxybutynin   History: Past Medical History:  Diagnosis Date  .  Arthritis   . Blood in stool   . Cancer ([place])   . Chicken pox   . Diabetes mellitus 2000  . Dry skin   . GERD (gastroesophageal reflux disease)   . Hiatal hernia   . Hypertension   . Kidney infection 2014  . Osteoporosis   . Ovarian cancer ([place]) 1991   s/p  hysterectomy at [facility]  . Pernicious anemia   . Spinal stenosis   . Ulcer   . Urinary incontinence    Past Surgical History:  Procedure Laterality Date  . ABDOMINAL HYSTERECTOMY    . FOOT SURGERY  2000  . SHOULDER SURGERY  2013   fracture right shoulder   Family History  Problem Relation Age of Onset  . Arthritis Mother   . Cancer Mother        colon  . Arthritis Father   . Cancer Father        colon  . Cancer Sister        lung  . Cancer Other        lung   Social History   Socioeconomic History  . Marital status: Widowed    Spouse name: Not on file  . Number of children: 2  . Years of education: Not on file  . Highest education level: 9th grade  Occupational History  . Occupation: Retired  Tobacco Use  . Smoking status: Never Smoker  . Smokeless tobacco: Never Used  . Tobacco comment: smoking cessation materials not required  Vaping Use  . Vaping Use: Never used  Substance and Sexual Activity  . Alcohol use: No  . Drug use: No  . Sexual activity: Not Currently    Birth control/protection: Post-menopausal  Other Topics Concern  . Not on file  Social History Narrative   Lives alone in [place]. 1 daughter living in [place]. Son died lung cancer.   Social Determinants of Health   Financial Resource Strain: Low Risk   . Difficulty of Paying Living Expenses: Not hard at all  Food Insecurity: No Food Insecurity  . Worried About Charity fundraiser in the Last Year: Never true  . Ran Out of Food in the Last Year: Never true  Transportation Needs: No Transportation Needs  . Lack of Transportation (Medical): No  . Lack of Transportation (Non-Medical): No  Physical Activity: Inactive  . Days of Exercise per Week: 0 days  . Minutes of Exercise per Session: 0 min  Stress: Stress Concern Present  . Feeling of Stress : Rather much  Social Connections: Moderately Isolated  . Frequency of Communication with Friends and Family: More than three times a week  .  Frequency of Social Gatherings with Friends and Family: Three times a week  . Attends Religious Services: More than 4 times per year  . Active Member of Clubs or Organizations: No  . Attends Archivist Meetings: Never  . Marital Status: Widowed    Tobacco Counseling Counseling given: Not Answered Comment: smoking cessation materials not required   Clinical Intake:  Pre-visit preparation completed: Yes  Pain : No/denies pain     Nutritional Risks: None Diabetes: Yes CBG done?: No Did pt. bring in CBG monitor from home?: No  How often do you need to have someone help you when you read instructions, pamphlets, or other written materials from your doctor or pharmacy?: 1 - Never  Nutrition Risk Assessment:  Has the patient had any N/V/D within the last 2 months?  No  Does  the patient have any non-healing wounds?  No  Has the patient had any unintentional weight loss or weight gain?  No   Diabetes:  Is the patient diabetic?  Yes  If diabetic, was a CBG obtained today?  No  Did the patient bring in their glucometer from home?  No  How often do you monitor your CBG's? 2-3 times daily.   Financial Strains and Diabetes Management:  Are you having any financial strains with the device, your supplies or your medication? No .  Does the patient want to be seen by Chronic Care Management for management of their diabetes?  No  Would the patient like to be referred to a Nutritionist or for Diabetic Management?  No   Diabetic Exams:  Diabetic Eye Exam: Completed 06/13/18 negative retinopathy. Overdue for diabetic eye exam. Pt has been advised about the importance in completing this exam.   Diabetic Foot Exam: Completed 02/21/18. Pt has been advised about the importance in completing this exam. Pt is scheduled for diabetic foot exam on 03/11/20.    Interpreter Needed?: No  Information entered by :: Brenda Marker LPN   Activities of Daily Living In your present state of  health, do you have any difficulty performing the following activities: 03/09/2020  Hearing? Y  Comment doesn't wear her hearing aids  Vision? Y  Difficulty concentrating or making decisions? N  Walking or climbing stairs? Y  Dressing or bathing? N  Doing errands, shopping? N  Preparing Food and eating ? N  Using the Toilet? N  In the past six months, have you accidently leaked urine? Y  Comment wears depends  Do you have problems with loss of bowel control? N  Managing your Medications? N  Managing your Finances? N  Housekeeping or managing your Housekeeping? N  Some recent data might be hidden    Patient Care Team: Juline Patch, MD as PCP - General (Family Medicine) Leandrew Koyanagi, MD as Consulting Physician (Ophthalmology)  Indicate any recent Medical Services you may have received from other than [facility] providers in the past year (date may be approximate).     Assessment:   This is a routine wellness examination for Brenda [name].  Hearing/Vision screen  Hearing Screening   _0  _1  _2  _3  _4  _5  _6  _7  _8   Right ear:           Left ear:           Comments: Pt c/o worsening difficulty with hearing, has hearing aids but does not wear them  Vision Screening Comments: [facility] screen[facility] Dr. Wallace Going; due for exam  Dietary issues and exercise activities discussed: Current Exercise Habits: The patient does not participate in regular exercise at present, Exercise limited by: orthopedic condition(s)  Goals    . DIET - INCREASE WATER INTAKE     Recommend to drink at least 6-8 8oz glasses of water per day.    . Exercise 150 minutes per week (moderate activity)     Recommend increasing exercise to help promote better sleep      Depression Screen PHQ 2/9 Scores 03/09/2020 02/18/2020 03/09/2019 02/19/2018 02/18/2017 01/23/2016 11/04/2015  PHQ - 2 Score 1 0 2 0 0 0 0  PHQ- 9 Score 5 0 6 0 - - -    Fall Risk Fall Risk   03/09/2020 02/18/2020 03/09/2019 02/19/2018 02/18/2017  Falls in the past year? 0 0 0 No No  Number falls in past yr: 0 - 0 - -  Injury with Fall? 0 - 0 - -  Risk Factor Category  - - - - -  Risk for fall due to : Impaired balance/gait;Impaired mobility;Impaired vision;Orthopedic patient Impaired balance/gait Impaired balance/gait;Impaired vision Impaired vision;History of fall(s);Impaired balance/gait;Medication side effect -  Risk for fall due to: Comment - - - wears eyeglasses; ambulates with cane; weakness -  Follow up Falls prevention discussed Falls evaluation completed Falls prevention discussed - -    Any stairs in or around the home? Yes  If so, are there any without handrails? Yes  Home free of loose throw rugs in walkways, pet beds, electrical cords, etc? Yes  Adequate lighting in your home to reduce risk of falls? Yes   ASSISTIVE DEVICES UTILIZED TO PREVENT FALLS:  Life alert? Yes  Use of a cane, walker or w/c? Yes  Grab bars in the bathroom? No  Shower chair or bench in shower? Yes  Elevated toilet seat or a handicapped toilet? Yes   TIMED UP AND GO:  Was the test performed? No . Telephonic visit.   Cognitive Function:     6CIT Screen 03/09/2020 03/09/2019 02/19/2018 02/18/2017  What Year? 0 points 0 points 0 points 0 points  What month? 0 points 0 points 0 points 0 points  What time? 0 points 0 points 0 points 0 points  Count back from 20 0 points 0 points 0 points 0 points  Months in reverse 0 points 2 points 0 points 0 points  Repeat phrase 2 points 4 points 0 points 0 points  Total Score 2 6 0 0    Immunizations Immunization History  Administered Date(s) Administered  . Fluad Quad(high Dose 65+) 06/30/2019  . Influenza Split 06/18/2012, 06/18/2015, 06/15/2016  . Influenza, High Dose Seasonal PF 06/24/2017  . Influenza,inj,Quad PF,6+ Mos 06/25/2013, 07/09/2014  . Influenza-Unspecified 09/10/2018  . PFIZER SARS-COV-2 Vaccination 10/17/2019, 11/07/2019  .  Pneumococcal Conjugate-13 01/01/2014  . Pneumococcal-Unspecified 08/29/2011    TDAP status: Due, Education has been provided regarding the importance of this vaccine. Advised may receive this vaccine at local pharmacy or Health Dept. Aware to provide a copy of the vaccination record if obtained from local pharmacy or Health Dept. Verbalized acceptance and understanding.   Flu Vaccine status: Up to date   Pneumococcal vaccine status: Up to date   Covid-19 vaccine status: Completed vaccines  Qualifies for Shingles Vaccine? Yes   Zostavax completed No   Shingrix Completed?: No.    Education has been provided regarding the importance of this vaccine. Patient has been advised to call insurance company to determine out of pocket expense if they have not yet received this vaccine. Advised may also receive vaccine at local pharmacy or Health Dept. Verbalized acceptance and understanding.  Screening Tests Health Maintenance  Topic Date Due  . FOOT EXAM  02/22/2019  . OPHTHALMOLOGY EXAM  06/14/2019  . TETANUS/TDAP  05/28/2020 (Originally 07/23/2017)  . INFLUENZA VACCINE  04/10/2020  . HEMOGLOBIN A1C  05/02/2020  . DEXA SCAN  Completed  . COVID-19 Vaccine  Completed    Health Maintenance  Health Maintenance Due  Topic Date Due  . FOOT EXAM  02/22/2019  . OPHTHALMOLOGY EXAM  06/14/2019    Colorectal cancer screening: No longer required.    Mammogram status: No longer required.    Bone Density Screening: No longer required.   Lung Cancer Screening: (Low Dose CT Chest recommended if Age 61-80 years, 30 pack-year currently smoking OR have quit w/in 15years.) does not qualify.   Additional  Screening:  Hepatitis C Screening: no longer required  Vision Screening: Recommended annual ophthalmology exams for early detection of glaucoma and other disorders of the eye. Is the patient up to date with their annual eye exam?  Yes  Who is the provider or what is the name of the office in which  the patient attends annual eye exams? [place] Screening: Recommended annual dental exams for proper oral hygiene  Community Resource Referral / Chronic Care Management: CRR required this visit?  No   CCM required this visit?  No      Plan:     I have personally reviewed and noted the following in the patient's chart:   . Medical and social history . Use of alcohol, tobacco or illicit drugs  . Current medications and supplements . Functional ability and status . Nutritional status . Physical activity . Advanced directives . List of other physicians . Hospitalizations, surgeries, and ER visits in previous 12 months . Vitals . Screenings to include cognitive, depression, and falls . Referrals and appointments  In addition, I have reviewed and discussed with patient certain preventive protocols, quality metrics, and best practice recommendations. A written personalized care plan for preventive services as well as general preventive health recommendations were provided to patient.     Brenda Marker, LPN   [id number]   Nurse Notes: pt states blood sugar has been elevated, has appt with endo Friday 03/11/20.

## 2020-03-15 ENCOUNTER — Telehealth: Payer: Self-pay | Admitting: Family Medicine

## 2020-03-15 ENCOUNTER — Other Ambulatory Visit: Payer: Self-pay

## 2020-03-15 DIAGNOSIS — R0602 Shortness of breath: Secondary | ICD-10-CM

## 2020-03-15 NOTE — Telephone Encounter (Unsigned)
Copied from [place] [id number]. Topic: General - Inquiry >> Mar 15, 2020  8:43 AM Scherrie Gerlach wrote: Reason for CRM: pt would like Baxter Flattery to give her a call.  Pt declined any other info, she states I just want to speak with her. Baxter Flattery)

## 2020-03-15 NOTE — Telephone Encounter (Signed)
Referral placed to cardio for SOB

## 2020-03-15 NOTE — Progress Notes (Signed)
Referral placed to cardio

## 2020-03-20 NOTE — Progress Notes (Deleted)
03/21/2020 2:52 PM   Brenda [name] [birth year] [id number]  Referring provider: Juline Patch, MD [address] [place],  [place] [address]  No chief complaint on file.   HPI: Brenda [name] is an 86 y.o. female with urge incontinence who presents today to discuss other options as she feels the PTNS is not working.  She was initially seen by Dr. Nickolas Madrid for her urge incontinence.  She had failed Myrbetriq and several anticholinergics.    Patient states that she has had urinary incontinence for greater than 10 years.    Patient has incontinence with ***.   She is experiencing *** incontinent episodes during the day. She is experiencing *** incontinent episodes during the night.  Her incontinence volume is ***.   She is wearing *** pads/depends daily.    She is having associated urinary frequency, urgency, dysuria, nocturia, intermittency, hesitancy, straining to urinate and weak urinary stream.   ***  She does/does not have a history of urinary tract infections, STI's or injury to the bladder. ***  She denies/endorses dysuria, gross hematuria, suprapubic pain, back pain, abdominal pain or flank pain.***  She has not had any recent fevers, chills, nausea or vomiting. ***  She does/does not have a history of nephrolithiasis, GU surgery or GU trauma. ***  She is/is not sexually active.  She has/has not noted incontinence with sexual intercourse.  ***   She is post menopausal.   She admits to/denies constipation and/or diarrhea. ***  She is having/ not having pain with bladder filling.  ***  She has/not had any recent imaging studies.  ***  She is drinking *** of water daily.   She is drinking *** caffeinated beverages daily.  She is drinking *** alcoholic beverages daily.    PMH: Past Medical History:  Diagnosis Date  . Arthritis   . Blood in stool   . Cancer ([place])   . Chicken pox   . Diabetes mellitus 2000  . Dry skin   . GERD (gastroesophageal  reflux disease)   . Hiatal hernia   . Hypertension   . Kidney infection 2014  . Osteoporosis   . Ovarian cancer ([place]) 1991   s/p hysterectomy at [facility]  . Pernicious anemia   . Spinal stenosis   . Ulcer   . Urinary incontinence     Surgical History: Past Surgical History:  Procedure Laterality Date  . ABDOMINAL HYSTERECTOMY    . FOOT SURGERY  2000  . SHOULDER SURGERY  2013   fracture right shoulder    Home Medications:  Allergies as of 03/21/2020      Reactions   Aspirin Other (See Comments)   bleeding   Sulfa Antibiotics Swelling   Oxybutynin Other (See Comments)   Dry mouth      Medication List       Accurate as of March 20, 2020  2:52 PM. If you have any questions, ask your nurse or doctor.        Accu-Chek Aviva Plus test strip Generic drug: glucose blood USE 1 STRIP TO CHECK GLUCOSE THREE TIMES DAILY   Accu-Chek Softclix Lancets lancets 3 (three) times daily. Use as instructed. E11.42   cholecalciferol 1000 units tablet Commonly known as: VITAMIN D Take 4,000 Units by mouth daily.   cyanocobalamin 1000 MCG/ML injection Commonly known as: (VITAMIN B-12) Inject 1,000 mcg into the muscle every 30 (thirty) days.   ferrous sulfate 324 (65 Fe) MG Tbec Take 1 tablet (325  mg total) by mouth daily.   glipiZIDE 10 MG tablet Commonly known as: GLUCOTROL Take 1 tablet by mouth 2 (two) times a day.   lisinopril 5 MG tablet Commonly known as: ZESTRIL TAKE 1 TABLET(5 MG) BY MOUTH DAILY   metFORMIN 1000 MG tablet Commonly known as: GLUCOPHAGE Take 1 tablet (1,000 mg total) by mouth 2 (two) times daily with a meal.   montelukast 10 MG tablet Commonly known as: SINGULAIR Take 1 tablet (10 mg total) by mouth at bedtime.   nystatin cream Commonly known as: MYCOSTATIN Apply 1 application topically 2 (two) times daily.   pantoprazole 40 MG tablet Commonly known as: PROTONIX Take 1 tablet (40 mg total) by mouth daily.   pioglitazone 15 MG tablet Commonly  known as: ACTOS Take 1 tablet by mouth daily.       Allergies:  Allergies  Allergen Reactions  . Aspirin Other (See Comments)    bleeding  . Sulfa Antibiotics Swelling  . Oxybutynin Other (See Comments)    Dry mouth    Family History: Family History  Problem Relation Age of Onset  . Arthritis Mother   . Cancer Mother        colon  . Arthritis Father   . Cancer Father        colon  . Cancer Sister        lung  . Cancer Other        lung    Social History:  reports that she has never smoked. She has never used smokeless tobacco. She reports that she does not drink alcohol and does not use drugs.  ROS: Pertinent ROS in HPI  Physical Exam: There were no vitals taken for this visit.  Constitutional:  Well nourished. Alert and oriented, No acute distress. HEENT: [place] AT, moist mucus membranes.  Trachea midline, no masses. Cardiovascular: No clubbing, cyanosis, or edema. Respiratory: Normal respiratory effort, no increased work of breathing. GI: Abdomen is soft, non tender, non distended, no abdominal masses. Liver and spleen not palpable.  No hernias appreciated.  Stool sample for occult testing is not indicated.   GU: No CVA tenderness.  No bladder fullness or masses.  *** external genitalia, *** pubic hair distribution, no lesions.  Normal urethral meatus, no lesions, no prolapse, no discharge.   No urethral masses, tenderness and/or tenderness. No bladder fullness, tenderness or masses. *** vagina mucosa, *** estrogen effect, no discharge, no lesions, *** pelvic support, *** cystocele and *** rectocele noted.  No cervical motion tenderness.  Uterus is freely mobile and non-fixed.  No adnexal/parametria masses or tenderness noted.  Anus and perineum are without rashes or lesions.   ***  Skin: No rashes, bruises or suspicious lesions. Lymph: No cervical or inguinal adenopathy. Neurologic: Grossly intact, no focal deficits, moving all 4 extremities. Psychiatric: Normal mood and  affect.   Laboratory Data: Lab Results  Component Value Date   WBC 5.5 02/21/2018   HGB 11.4 02/21/2018   HCT 34.2 02/21/2018   MCV 100 (H) 02/21/2018   PLT 296 02/21/2018    Lab Results  Component Value Date   CREATININE 0.93 04/11/2016    Lab Results  Component Value Date   HGBA1C 7.9 11/16/2016    Lab Results  Component Value Date   TSH 1.63 07/09/2013       Component Value Date/Time   CHOL 149 08/18/2015 1359   HDL 56.90 08/18/2015 1359   CHOLHDL 3 08/18/2015 1359   VLDL 19.8 08/18/2015 1359  LDLCALC 72 08/18/2015 1359    Lab Results  Component Value Date   AST 21 10/25/2015   Lab Results  Component Value Date   ALT 16 10/25/2015    Urinalysis    Component Value Date/Time   COLORURINE YELLOW 01/31/2020 1005   APPEARANCEUR HAZY (A) 01/31/2020 1005   APPEARANCEUR Clear 05/08/2014 0757   LABSPEC 1.020 01/31/2020 1005   LABSPEC 1.005 05/08/2014 0757   PHURINE 5.0 01/31/2020 1005   GLUCOSEU 100 (A) 01/31/2020 1005   GLUCOSEU Negative 05/08/2014 0757   HGBUR NEGATIVE 01/31/2020 1005   BILIRUBINUR NEGATIVE 01/31/2020 1005   BILIRUBINUR negative 05/29/2019 1541   BILIRUBINUR Negative 05/08/2014 0757   KETONESUR TRACE (A) 01/31/2020 1005   PROTEINUR TRACE (A) 01/31/2020 1005   UROBILINOGEN 0.2 05/29/2019 1541   NITRITE POSITIVE (A) 01/31/2020 1005   LEUKOCYTESUR MODERATE (A) 01/31/2020 1005   LEUKOCYTESUR Trace 05/08/2014 0757    I have reviewed the labs.   Pertinent Imaging: @CT @ @ultrasound @ @KUB @ I have independently reviewed the films.    Assessment & Plan:  ***  1. Urge Incontinence  - offered behavioral therapies  - bladder training  - bladder control strategies  - pelvic floor muscle training  - fluid management   - offered medical therapy with anticholinergic therapy or beta-3 adrenergic receptor agonist and the potential side effects of each therapy ***  - offered refer to gynecology for a pessary fitting ***  - offered an  appointment with one of our surgeon for a possible pelvic sling procedure ***  - would like to try the beta-3 adrenergic receptor agonist (Myrbetriq).  Given Myrbetriq 25 mg samples, #28.  I have reviewed with the patient of the side effects of Myrbetriq, such as: elevation in BP, urinary retention and/or HA.  She will return in one month for PVR and symptom recheck.  ***  - would like to try anticholinergic therapy.  Given Vesicare 5mg /10mg , Toviaz 4mg /8mg  samples, # 28.   Advised of the side effects, such as: Dry eyes, dry mouth, constipation, mental confusion and/or urinary retention. ***  - RTC in 3 weeks for PVR and symptom recheck ***     No follow-ups on file.  These notes generated with voice recognition software. I apologize for typographical errors.  Zara Council, PA-C  [facility] [address]  [place], [place] [address] [phone]

## 2020-03-21 ENCOUNTER — Ambulatory Visit: Payer: Medicare Other | Admitting: Urology

## 2020-03-23 ENCOUNTER — Telehealth: Payer: Self-pay | Admitting: Family Medicine

## 2020-03-23 NOTE — Telephone Encounter (Unsigned)
Copied from [place] [id number]. Topic: General - Inquiry >> Mar 23, 2020  8:25 AM Lennox Solders wrote: Reason for CRM: please is calling and would like tara to return her call concerning blood work from dr [employer] office. Pt said dr Ninfa Linden office suppose to have fax over to dr Ronnald Ramp

## 2020-03-23 NOTE — Telephone Encounter (Signed)
I spoke to Brenda [name] and told her that she needs to get back in with Dr Phillip Heal this week. She said she would call and she "has the number." I discussed with her neurology vs ENT for allergy testing. As well as Dr Ronnald Ramp stating that we can stop her lisinopril, though she doesn't believe that would cause itching and burning. If stopping the lisinopril, we will need to monitor b/p. Brenda [name] didn't sound like she agreed with that plan. She stated "let me think about it." I explained that Dr Elveria Rising office has funny hours sometimes and she should go ahead and call today to see if she can get in this week.

## 2020-03-25 DIAGNOSIS — E1159 Type 2 diabetes mellitus with other circulatory complications: Secondary | ICD-10-CM | POA: Diagnosis not present

## 2020-03-25 DIAGNOSIS — E1142 Type 2 diabetes mellitus with diabetic polyneuropathy: Secondary | ICD-10-CM | POA: Diagnosis not present

## 2020-03-25 DIAGNOSIS — I10 Essential (primary) hypertension: Secondary | ICD-10-CM | POA: Diagnosis not present

## 2020-03-25 DIAGNOSIS — I152 Hypertension secondary to endocrine disorders: Secondary | ICD-10-CM | POA: Diagnosis not present

## 2020-03-25 DIAGNOSIS — E1165 Type 2 diabetes mellitus with hyperglycemia: Secondary | ICD-10-CM | POA: Diagnosis not present

## 2020-03-25 DIAGNOSIS — J449 Chronic obstructive pulmonary disease, unspecified: Secondary | ICD-10-CM | POA: Diagnosis not present

## 2020-03-25 DIAGNOSIS — R0602 Shortness of breath: Secondary | ICD-10-CM | POA: Diagnosis not present

## 2020-03-25 DIAGNOSIS — I739 Peripheral vascular disease, unspecified: Secondary | ICD-10-CM | POA: Diagnosis not present

## 2020-03-27 NOTE — Progress Notes (Signed)
03/28/2020 9:19 AM   Brenda [name] 12/20/1930 [id number]  Referring provider: Juline Patch, MD [address] [place],  [place] [address]  Chief Complaint  Patient presents with  . PTNS    HPI: Brenda [name] is an 86 y.o. female with urge incontinence who presents today to discuss other options as she feels the PTNS is not working.  She was initially seen by Dr. Nickolas Madrid for her urge incontinence.  She had failed Myrbetriq and several anticholinergics.    Patient states that she has had urinary incontinence for greater than 10 years.   She feels that her PTNS treatments are not working and would like to discuss other options or just discontinuing the treatments.     Patient has incontinence with urge.   Her incontinence volume is large.   She is wearing 12 pads/depends daily.    She is post menopausal.   She denies constipation and/or diarrhea.   She is drinking water daily.   She feels that the PTNS has not been helpful, but she was having 5-6 daytime voids, 5-6 nighttime voids, strong urgency and 5-6 incontinent episodes on week one of her treatments.  At her last treatment, she noted 2-3 daytime voids, 2 nighttime voids, mild urgency and one incontinent episodes during the day  PMH: Past Medical History:  Diagnosis Date  . Arthritis   . Blood in stool   . Cancer ([place])   . Chicken pox   . Diabetes mellitus 2000  . Dry skin   . GERD (gastroesophageal reflux disease)   . Hiatal hernia   . Hypertension   . Kidney infection 2014  . Osteoporosis   . Ovarian cancer ([place]) 1991   s/p hysterectomy at [facility]  . Pernicious anemia   . Spinal stenosis   . Ulcer   . Urinary incontinence     Surgical History: Past Surgical History:  Procedure Laterality Date  . ABDOMINAL HYSTERECTOMY    . FOOT SURGERY  2000  . SHOULDER SURGERY  2013   fracture right shoulder    Home Medications:  Allergies as of 03/28/2020      Reactions   Aspirin Other (See  Comments)   bleeding   Sulfa Antibiotics Swelling   Oxybutynin Other (See Comments)   Dry mouth      Medication List       Accurate as of March 28, 2020  9:19 AM. If you have any questions, ask your nurse or doctor.        STOP taking these medications   pantoprazole 40 MG tablet Commonly known as: PROTONIX Stopped by: Ainsleigh Kakos, PA-C     TAKE these medications   Accu-Chek Aviva Plus test strip Generic drug: glucose blood USE 1 STRIP TO CHECK GLUCOSE THREE TIMES DAILY   Accu-Chek Softclix Lancets lancets 3 (three) times daily. Use as instructed. E11.42   cholecalciferol 1000 units tablet Commonly known as: VITAMIN D Take 4,000 Units by mouth daily.   cyanocobalamin 1000 MCG/ML injection Commonly known as: (VITAMIN B-12) Inject 1,000 mcg into the muscle every 30 (thirty) days.   ferrous sulfate 324 (65 Fe) MG Tbec Take 1 tablet (325 mg total) by mouth daily.   glipiZIDE 10 MG tablet Commonly known as: GLUCOTROL Take 1 tablet by mouth 2 (two) times a day.   lisinopril 5 MG tablet Commonly known as: ZESTRIL TAKE 1 TABLET(5 MG) BY MOUTH DAILY   metFORMIN 1000 MG tablet Commonly known as: GLUCOPHAGE  Take 1 tablet (1,000 mg total) by mouth 2 (two) times daily with a meal.   montelukast 10 MG tablet Commonly known as: SINGULAIR Take 1 tablet (10 mg total) by mouth at bedtime.   nystatin cream Commonly known as: MYCOSTATIN Apply 1 application topically 2 (two) times daily.   pioglitazone 15 MG tablet Commonly known as: ACTOS Take 1 tablet by mouth daily.       Allergies:  Allergies  Allergen Reactions  . Aspirin Other (See Comments)    bleeding  . Sulfa Antibiotics Swelling  . Oxybutynin Other (See Comments)    Dry mouth    Family History: Family History  Problem Relation Age of Onset  . Arthritis Mother   . Cancer Mother        colon  . Arthritis Father   . Cancer Father        colon  . Cancer Sister        lung  . Cancer Other         lung    Social History:  reports that she has never smoked. She has never used smokeless tobacco. She reports that she does not drink alcohol and does not use drugs.  ROS: Pertinent ROS in HPI  Physical Exam: BP (!) 169/77   Pulse 96   Ht 5\' 5"  (1.651 m)   Wt 191 lb (86.6 kg)   BMI 31.78 kg/m   Constitutional:  Well nourished. Alert and oriented, No acute distress. HEENT: [place] AT, mask in place.  Trachea midline Cardiovascular: No clubbing, cyanosis, or edema. Respiratory: Normal respiratory effort, no increased work of breathing. Neurologic: Grossly intact, no focal deficits, moving all 4 extremities. Psychiatric: Normal mood and affect.   Laboratory Data: Lab Results  Component Value Date   WBC 5.5 02/21/2018   HGB 11.4 02/21/2018   HCT 34.2 02/21/2018   MCV 100 (H) 02/21/2018   PLT 296 02/21/2018    Lab Results  Component Value Date   CREATININE 0.93 04/11/2016    Lab Results  Component Value Date   HGBA1C 7.9 11/16/2016    Lab Results  Component Value Date   TSH 1.63 07/09/2013       Component Value Date/Time   CHOL 149 08/18/2015 1359   HDL 56.90 08/18/2015 1359   CHOLHDL 3 08/18/2015 1359   VLDL 19.8 08/18/2015 1359   LDLCALC 72 08/18/2015 1359    Lab Results  Component Value Date   AST 21 10/25/2015   Lab Results  Component Value Date   ALT 16 10/25/2015    Urinalysis    Component Value Date/Time   COLORURINE YELLOW 01/31/2020 1005   APPEARANCEUR HAZY (A) 01/31/2020 1005   APPEARANCEUR Clear 05/08/2014 0757   LABSPEC 1.020 01/31/2020 1005   LABSPEC 1.005 05/08/2014 0757   PHURINE 5.0 01/31/2020 1005   GLUCOSEU 100 (A) 01/31/2020 1005   GLUCOSEU Negative 05/08/2014 0757   HGBUR NEGATIVE 01/31/2020 1005   BILIRUBINUR NEGATIVE 01/31/2020 1005   BILIRUBINUR negative 05/29/2019 1541   BILIRUBINUR Negative 05/08/2014 0757   KETONESUR TRACE (A) 01/31/2020 1005   PROTEINUR TRACE (A) 01/31/2020 1005   UROBILINOGEN 0.2 05/29/2019 1541    NITRITE POSITIVE (A) 01/31/2020 1005   LEUKOCYTESUR MODERATE (A) 01/31/2020 1005   LEUKOCYTESUR Trace 05/08/2014 0757    I have reviewed the labs.   Assessment & Plan:    1. Urge Incontinence She has failed anticholinergics and Myrbetriq. She feels the PTNS has not been helpful, but I reviewed her  results so far with her as they have noted improvement and she stated she will complete the therapy  # 8 PTNS completed today   Return in about 1 week (around 04/04/2020) for PTNS # 9.  These notes generated with voice recognition software. I apologize for typographical errors.  Zara Council, PA-C  [facility] [address]  [place], [place] [address] [phone]  I spent 20 minutes on the day of the encounter to include pre-visit record review, face-to-face time with the patient, and post-visit ordering of tests.

## 2020-03-28 ENCOUNTER — Encounter: Payer: Self-pay | Admitting: Urology

## 2020-03-28 ENCOUNTER — Ambulatory Visit (INDEPENDENT_AMBULATORY_CARE_PROVIDER_SITE_OTHER): Payer: Medicare Other | Admitting: Urology

## 2020-03-28 ENCOUNTER — Other Ambulatory Visit: Payer: Self-pay

## 2020-03-28 VITALS — BP 169/77 | HR 96 | Ht 65.0 in | Wt 191.0 lb

## 2020-03-28 DIAGNOSIS — N3941 Urge incontinence: Secondary | ICD-10-CM

## 2020-03-28 NOTE — Progress Notes (Signed)
Patient ID: Brenda [name], female   DOB: 1930/12/18, 86 y.o.   MRN: [id number] PTNS  Session # 8  Health & Social Factors: no change Caffeine: 1 Alcohol: 0 Daytime voids #per day: 3-4 Night-time voids #per night: 2 Urgency: mild Incontinence Episodes #per day: 1 Ankle used: left Treatment Setting: 10 Feeling/ Response: both Comments: patient tolerated well  Preformed By: Edwin Dada, CMA  Assistant:   Follow Up: 1 week for #9

## 2020-04-03 NOTE — Progress Notes (Signed)
PTNS  Session # 9  Health & Social Factors: no change Caffeine: 1 Alcohol: 0 Daytime voids #per day: 3 Night-time voids #per night: 2 Urgency: mild Incontinence Episodes #per day: 1 Ankle used: left Treatment Setting: 12 Feeling/ Response: both Comments: patient tolerated well  Preformed By: Edwin Dada, CMA  Assistant:   Follow Up: 1 week for #10

## 2020-04-04 ENCOUNTER — Encounter: Payer: Self-pay | Admitting: Urology

## 2020-04-04 ENCOUNTER — Other Ambulatory Visit: Payer: Self-pay

## 2020-04-04 ENCOUNTER — Ambulatory Visit (INDEPENDENT_AMBULATORY_CARE_PROVIDER_SITE_OTHER): Payer: Medicare Other | Admitting: Urology

## 2020-04-04 ENCOUNTER — Ambulatory Visit (INDEPENDENT_AMBULATORY_CARE_PROVIDER_SITE_OTHER): Payer: Medicare Other

## 2020-04-04 DIAGNOSIS — E538 Deficiency of other specified B group vitamins: Secondary | ICD-10-CM

## 2020-04-04 DIAGNOSIS — N3281 Overactive bladder: Secondary | ICD-10-CM

## 2020-04-04 MED ORDER — CYANOCOBALAMIN 1000 MCG/ML IJ SOLN
1000.0000 ug | Freq: Once | INTRAMUSCULAR | Status: AC
  Administered 2020-04-04: 1000 ug via INTRAMUSCULAR

## 2020-04-10 NOTE — Progress Notes (Deleted)
PTNS  Session # ***  Health & Social Factors: *** Caffeine: *** Alcohol: *** Daytime voids #per day: *** Night-time voids #per night: *** Urgency: *** Incontinence Episodes #per day: *** Ankle used: *** Treatment Setting: *** Feeling/ Response: *** Comments: ***  Preformed By: ***  Assistant: ***  Follow Up: ***

## 2020-04-11 ENCOUNTER — Ambulatory Visit: Payer: Medicare Other | Admitting: Urology

## 2020-04-17 NOTE — Progress Notes (Signed)
PTNS  Session # 10  Health & Social Factors: no change Caffeine: 1 Alcohol: 0 Daytime voids #per day: 3-4 Night-time voids #per night: 2 Urgency: mild Incontinence Episodes #per day: 1 Ankle used: left Treatment Setting: 1 Feeling/ Response: both Comments: patient tolerated well   Preformed By: Edwin Dada, cma   Follow Up: 1 week session 11

## 2020-04-18 ENCOUNTER — Telehealth: Payer: Medicare Other | Admitting: Family Medicine

## 2020-04-18 ENCOUNTER — Other Ambulatory Visit: Payer: Self-pay

## 2020-04-18 ENCOUNTER — Ambulatory Visit (INDEPENDENT_AMBULATORY_CARE_PROVIDER_SITE_OTHER): Payer: Medicare Other | Admitting: Urology

## 2020-04-18 DIAGNOSIS — N3281 Overactive bladder: Secondary | ICD-10-CM

## 2020-04-21 ENCOUNTER — Ambulatory Visit
Admission: EM | Admit: 2020-04-21 | Discharge: 2020-04-21 | Disposition: A | Discharge status: Home / Self Care | Payer: Medicare Other | Attending: Family Medicine | Admitting: Family Medicine

## 2020-04-21 ENCOUNTER — Other Ambulatory Visit: Payer: Self-pay

## 2020-04-21 ENCOUNTER — Encounter: Payer: Self-pay | Admitting: Emergency Medicine

## 2020-04-21 DIAGNOSIS — N3000 Acute cystitis without hematuria: Secondary | ICD-10-CM

## 2020-04-21 DIAGNOSIS — L299 Pruritus, unspecified: Secondary | ICD-10-CM | POA: Diagnosis not present

## 2020-04-21 LAB — URINALYSIS, COMPLETE (UACMP) WITH MICROSCOPIC
Bilirubin Urine: NEGATIVE
Glucose, UA: 100 mg/dL — AB
Hgb urine dipstick: NEGATIVE
Ketones, ur: NEGATIVE mg/dL
Nitrite: POSITIVE — AB
Protein, ur: NEGATIVE mg/dL
RBC / HPF: NONE SEEN RBC/hpf (ref 0–5)
Specific Gravity, Urine: 1.02 (ref 1.005–1.030)
pH: 6 (ref 5.0–8.0)

## 2020-04-21 MED ORDER — CETIRIZINE HCL 5 MG PO TABS: 5.0000 mg | ORAL_TABLET | Freq: Every day | ORAL | 0 refills | Status: DC

## 2020-04-21 MED ORDER — CEPHALEXIN 500 MG PO CAPS: 500.0000 mg | ORAL_CAPSULE | Freq: Two times a day (BID) | ORAL | 0 refills | Status: DC

## 2020-04-21 NOTE — ED Provider Notes (Signed)
[facility]    CSN: [id number] Arrival date & time: 04/21/20  1011   History   Chief Complaint Chief Complaint  Patient presents with  . Urinary Frequency  . Pruritis    HPI  86 year old Brenda [name] presents with the above complaints.   Patient reports ongoing urinary frequency and nocturia. Worse over the past 3 nights. She is concerned that she has a UTI. No fever. No relieving factors. Additionally, patient reports pruritis x 2 months. Worse at night. Has used topical steroid without improvement. No known inciting factors.  Past Medical History:  Diagnosis Date  . Arthritis   . Blood in stool   . Cancer ([place])   . Chicken pox   . Diabetes mellitus 2000  . Dry skin   . GERD (gastroesophageal reflux disease)   . Hiatal hernia   . Hypertension   . Kidney infection 2014  . Osteoporosis   . Ovarian cancer ([place]) 1991   s/p hysterectomy at [facility]  . Pernicious anemia   . Spinal stenosis   . Ulcer   . Urinary incontinence     Patient Active Problem List   Diagnosis Date Noted  . Chronic obstructive pulmonary disease ([place]) 05/29/2019  . Hypertension associated with type 2 diabetes mellitus ([place]) 06/Brenda/2020  . Diabetic peripheral neuropathy associated with type 2 diabetes mellitus ([place]) 07/25/2018  . Persistent microalbuminuria associated with type 2 diabetes mellitus ([place]) 07/22/2018  . Screening for breast cancer 08/18/2015  . B12 deficiency 12/02/2014  . Lumbago 07/09/2014  . Shortness of breath 06/30/2014  . Obese 06/30/2014  . Microalbuminuria 06/10/2014  . Right facial pain 05/18/2014  . Anemia 01/01/2014  . Osteoarthritis 07/31/2013  . Other malaise and fatigue 07/09/2013  . Muscle cramp, nocturnal 03/25/2013  . Increased frequency of urination 02/09/2013  . Urge incontinence 02/09/2013  . Medicare annual wellness visit, subsequent 12/02/2012  . Eczema 12/02/2012  . Other and unspecified hyperlipidemia 12/02/2012  . Essential hypertension, benign  12/02/2012  . Chronic low back pain 08/28/2012  . Osteoporosis 07/23/2012  . Nocturia 06/26/2012  . Paresthesia of bilateral legs 05/30/2012  . Pernicious anemia 05/30/2012  . Type 2 diabetes mellitus with hyperglycemia, without long-term current use of insulin ([place]) 05/30/2012  . Depression 05/30/2012  . Insomnia 05/30/2012  . Weakness of both legs 05/30/2012    Past Surgical History:  Procedure Laterality Date  . ABDOMINAL HYSTERECTOMY    . FOOT SURGERY  2000  . SHOULDER SURGERY  2013   fracture right shoulder    OB History   No obstetric history on file.      Home Medications    Prior to Admission medications   Medication Sig Start Date End Date Taking? Authorizing Provider  cholecalciferol (VITAMIN D) 1000 units tablet Take 4,000 Units by mouth daily.    Yes [provider]  cyanocobalamin (,VITAMIN B-12,) 1000 MCG/ML injection Inject 1,000 mcg into the muscle every 30 (thirty) days.   Yes [provider]  ferrous sulfate 324 (65 Fe) MG TBEC Take 1 tablet (325 mg total) by mouth daily. 04/11/16  Yes Juline Patch, MD  glipiZIDE (GLUCOTROL) 10 MG tablet Take 1 tablet by mouth 2 (two) times a day. 01/16/19  Yes [provider]  lisinopril (ZESTRIL) 5 MG tablet TAKE 1 TABLET(5 MG) BY MOUTH DAILY 01/17/20  Yes Juline Patch, MD  metFORMIN (GLUCOPHAGE) 1000 MG tablet Take 1 tablet (1,000 mg total) by mouth 2 (two) times daily with a meal. 04/11/16  Yes Juline Patch, MD  nystatin cream (MYCOSTATIN) Apply 1 application topically 2 (two) times daily. 05/29/19  Yes Juline Patch, MD  pioglitazone (ACTOS) 15 MG tablet Take 1 tablet by mouth daily. 01/24/19  Yes [provider]  ACCU-CHEK AVIVA PLUS test strip USE 1 STRIP TO [place] DAILY 12/09/18   [provider]  Accu-Chek Softclix Lancets lancets 3 (three) times daily. Use as instructed. E11.42 05/29/17   [provider]  cephALEXin (KEFLEX) 500 MG capsule Take  1 capsule (500 mg total) by mouth 2 (two) times daily. 04/21/20   Coral Spikes, DO  cetirizine (ZYRTEC) 5 MG tablet Take 1 tablet (5 mg total) by mouth at bedtime. 04/21/20   Coral Spikes, DO  montelukast (SINGULAIR) 10 MG tablet Take 1 tablet (10 mg total) by mouth at bedtime. 02/18/20 04/21/20  Juline Patch, MD    Family History Family History  Problem Relation Age of Onset  . Arthritis Mother   . Cancer Mother        colon  . Arthritis Father   . Cancer Father        colon  . Cancer Sister        lung  . Cancer Other        lung    Social History Social History   Tobacco Use  . Smoking status: Never Smoker  . Smokeless tobacco: Never Used  . Tobacco comment: smoking cessation materials not required  Vaping Use  . Vaping Use: Never used  Substance Use Topics  . Alcohol use: No  . Drug use: No     Allergies   Aspirin, Sulfa antibiotics, and Oxybutynin   Review of Systems Review of Systems  Constitutional: Negative for fever.  Genitourinary: Positive for frequency.       Nocturia.  Skin:       Itching.   Physical Exam Triage Vital Signs ED Triage Vitals  Enc Vitals Group     BP 04/21/20 1129 (!) 158/77     Pulse Rate 04/21/20 1129 72     Resp 04/21/20 1129 18     Temp 04/21/20 1129 98.4 F (36.9 C)     Temp Source 04/21/20 1129 Oral     SpO2 04/21/20 1129 96 %     Weight 04/21/20 1131 192 lb (87.1 kg)     Height 04/21/20 1131 5\' 5"  (1.651 m)     Head Circumference --      Peak Flow --      Pain Score 04/21/20 1130 0     Pain Loc --      Pain Edu? --      Excl. in [place]? --    Updated Vital Signs BP (!) 158/77 (BP Location: Left Arm)   Pulse 72   Temp 98.4 F (36.9 C) (Oral)   Resp 18   Ht 5\' 5"  (1.651 m)   Wt 87.1 kg   SpO2 96%   BMI 31.95 kg/m   Visual Acuity Right Eye Distance:   Left Eye Distance:   Bilateral Distance:    Right Eye Near:   Left Eye Near:    Bilateral Near:     Physical Exam Vitals and nursing note reviewed.    Constitutional:      General: She is not in acute distress.    Appearance: Normal appearance. She is not ill-appearing.  HENT:     Head: Normocephalic and atraumatic.  Eyes:  General:        Right eye: No discharge.        Left eye: No discharge.     Conjunctiva/sclera: Conjunctivae normal.  Cardiovascular:     Rate and Rhythm: Normal rate and regular rhythm.  Pulmonary:     Effort: Pulmonary effort is normal.     Breath sounds: Normal breath sounds. No wheezing, rhonchi or rales.  Skin:    General: Skin is warm.     Findings: No rash.  Neurological:     Mental Status: She is alert.  Psychiatric:        Mood and Affect: Mood normal.        Behavior: Behavior normal.    UC Treatments / Results  Labs (all labs ordered are listed, but only abnormal results are displayed) Labs Reviewed  URINALYSIS, COMPLETE (UACMP) WITH MICROSCOPIC - Abnormal; Notable for the following components:      Result Value   APPearance CLOUDY (*)    Glucose, UA 100 (*)    Nitrite POSITIVE (*)    Leukocytes,Ua MODERATE (*)    Bacteria, UA MANY (*)    All other components within normal limits  URINE CULTURE    EKG   Radiology No results found.  Procedures Procedures (including critical care time)  Medications Ordered in UC Medications - No data to display  Initial Impression / Assessment and Plan / UC Course  I have reviewed the triage vital signs and the nursing notes.  Pertinent labs & imaging results that were available during my care of the patient were reviewed by me and considered in my medical decision making (see chart for details).    86 year old Brenda [name] presents with urinary symptoms and itching. UA consistent with UTI. Treating with Keflex. Unclear etiology of pruritis. Given age, starting on Zyrtec (as opposed to Benadryl or Atarax).   Final Clinical Impressions(s) / UC Diagnoses   Final diagnoses:  Acute cystitis without hematuria  Pruritus     Discharge  Instructions     Medications as prescribed.  Follow up with Dr. Ronnald Ramp.  Take care  Dr. Lacinda Axon    ED Prescriptions    Medication Sig Dispense Auth. Provider   cephALEXin (KEFLEX) 500 MG capsule Take 1 capsule (500 mg total) by mouth 2 (two) times daily. 14 capsule Yoshito Gaza G, DO   cetirizine (ZYRTEC) 5 MG tablet Take 1 tablet (5 mg total) by mouth at bedtime. 30 tablet Coral Spikes, DO     PDMP not reviewed this encounter.   Coral Spikes, [place] 04/21/20 2346

## 2020-04-21 NOTE — ED Triage Notes (Signed)
Patient in today c/o increased urinary frequency at night x 3-4 days. Patient is under the care of a urologist for overactive bladder.  Patient also c/o itching over her entire body x 2 months, getting worse.

## 2020-04-21 NOTE — Discharge Instructions (Signed)
Medications as prescribed.  Follow up with Dr. Ronnald Ramp.  Take care  Dr. Lacinda Axon

## 2020-04-23 LAB — URINE CULTURE: Culture: >=100000 — AB

## 2020-04-25 ENCOUNTER — Ambulatory Visit: Payer: Medicare Other | Admitting: Urology

## 2020-04-25 ENCOUNTER — Telehealth (HOSPITAL_COMMUNITY): Payer: Self-pay | Admitting: Emergency Medicine

## 2020-04-25 MED ORDER — NITROFURANTOIN MONOHYD MACRO 100 MG PO CAPS: 100.0000 mg | ORAL_CAPSULE | Freq: Two times a day (BID) | ORAL | 0 refills | Status: DC

## 2020-04-25 NOTE — Telephone Encounter (Signed)
Upon review of patient's urine culture, will need to change antibiotic to Nitrofurantoin 100mg  PO BID x 5 days.  Attempted to call patient to inform her, no answer, unable to leave voicemail.  Medication sent to pharmacy on file.

## 2020-04-28 DIAGNOSIS — H538 Other visual disturbances: Secondary | ICD-10-CM | POA: Diagnosis not present

## 2020-04-28 DIAGNOSIS — E119 Type 2 diabetes mellitus without complications: Secondary | ICD-10-CM | POA: Diagnosis not present

## 2020-04-30 ENCOUNTER — Other Ambulatory Visit: Payer: Self-pay | Admitting: Family Medicine

## 2020-04-30 DIAGNOSIS — I10 Essential (primary) hypertension: Secondary | ICD-10-CM

## 2020-04-30 NOTE — Telephone Encounter (Signed)
Requested Prescriptions  Pending Prescriptions Disp Refills   lisinopril (ZESTRIL) 5 MG tablet [Pharmacy Med Name: LISINOPRIL 5MG  TABLETS] 90 tablet 0    Sig: TAKE 1 TABLET(5 MG) BY MOUTH DAILY     Cardiovascular:  ACE Inhibitors Failed - 04/30/2020  4:51 PM      Failed - Cr in normal range and within 180 days    Creatinine  Date Value Ref Range Status  05/08/2014 0.97 0.60 - 1.30 mg/dL Final   Creatinine, Ser  Date Value Ref Range Status  04/11/2016 0.93 0.57 - 1.00 mg/dL Final   Creatinine,U  Date Value Ref Range Status  02/16/2015 111.6 mg/dL Final         Failed - K in normal range and within 180 days    Potassium  Date Value Ref Range Status  04/11/2016 5.3 (H) 3.5 - 5.2 mmol/L Final  05/08/2014 3.7 3.5 - 5.1 mmol/L Final         Failed - Last BP in normal range    BP Readings from Last 1 Encounters:  04/21/20 (!) 158/77         Passed - Patient is not pregnant      Passed - Valid encounter within last 6 months    Recent Outpatient Visits          2 months ago [facility] Juline Patch, MD   6 months ago Overactive bladder   Gordon, MD   11 months ago Candidiasis of female genitalia   [facility] Juline Patch, MD   1 year ago History of recurrent UTI (urinary tract infection)   [facility] Juline Patch, MD   1 year ago [facility] Juline Patch, MD      Future Appointments            In 2 days [place], Gordan Payment [place]   In 3 weeks Ernestine Conrad Gordan Payment [place]

## 2020-05-01 NOTE — Progress Notes (Deleted)
PTNS  Session # ***  Health & Social Factors: *** Caffeine: *** Alcohol: *** Daytime voids #per day: *** Night-time voids #per night: *** Urgency: *** Incontinence Episodes #per day: *** Ankle used: *** Treatment Setting: *** Feeling/ Response: *** Comments: ***  Performed By: ***  Assistant: ***  Follow Up: ***

## 2020-05-02 ENCOUNTER — Ambulatory Visit: Payer: Medicare Other | Admitting: Urology

## 2020-05-22 NOTE — Progress Notes (Deleted)
05/23/2020 6:42 PM   Brenda [name] 01/01/1931 [id number]  Referring provider: Juline Patch, MD [address] [place],  [place] [address]  No chief complaint on file.   HPI: Brenda [name] is an 86 y.o. female with urge incontinence and rUTI's who presents today for follow up.  Urge incontinence Failed Myrbetriq and Toviaz.  Currently having PTNS treatments.    rUTI's Risk factors: age, post-menopausal,  01/31/2020 Klebsiella species -resistant to ampicillin Keflex 500 mg BID x 7days  04/21/2020 ESBL E.coli - resistant to ampicillin, ampicillin/sulbactum, cefazolin, ceftriaxone and ciprofloxacin  Keflex 500 mg BID x 7 days    PMH: Past Medical History:  Diagnosis Date  . Arthritis   . Blood in stool   . Cancer ([place])   . Chicken pox   . Diabetes mellitus 2000  . Dry skin   . GERD (gastroesophageal reflux disease)   . Hiatal hernia   . Hypertension   . Kidney infection 2014  . Osteoporosis   . Ovarian cancer ([place]) 1991   s/p hysterectomy at [facility]  . Pernicious anemia   . Spinal stenosis   . Ulcer   . Urinary incontinence     Surgical History: Past Surgical History:  Procedure Laterality Date  . ABDOMINAL HYSTERECTOMY    . FOOT SURGERY  2000  . SHOULDER SURGERY  2013   fracture right shoulder    Home Medications:  Allergies as of 05/23/2020      Reactions   Aspirin Other (See Comments)   bleeding   Sulfa Antibiotics Swelling   Oxybutynin Other (See Comments)   Dry mouth      Medication List       Accurate as of May 22, 2020  6:42 PM. If you have any questions, ask your nurse or doctor.        Accu-Chek Aviva Plus test strip Generic drug: glucose blood USE 1 STRIP TO CHECK GLUCOSE THREE TIMES DAILY   Accu-Chek Softclix Lancets lancets 3 (three) times daily. Use as instructed. E11.42   cephALEXin 500 MG capsule Commonly known as: KEFLEX Take 1 capsule (500 mg total) by mouth 2 (two) times daily.   cetirizine 5 MG  tablet Commonly known as: ZYRTEC Take 1 tablet (5 mg total) by mouth at bedtime.   cholecalciferol 1000 units tablet Commonly known as: VITAMIN D Take 4,000 Units by mouth daily.   cyanocobalamin 1000 MCG/ML injection Commonly known as: (VITAMIN B-12) Inject 1,000 mcg into the muscle every 30 (thirty) days.   ferrous sulfate 324 (65 Fe) MG Tbec Take 1 tablet (325 mg total) by mouth daily.   glipiZIDE 10 MG tablet Commonly known as: GLUCOTROL Take 1 tablet by mouth 2 (two) times a day.   lisinopril 5 MG tablet Commonly known as: ZESTRIL TAKE 1 TABLET(5 MG) BY MOUTH DAILY   metFORMIN 1000 MG tablet Commonly known as: GLUCOPHAGE Take 1 tablet (1,000 mg total) by mouth 2 (two) times daily with a meal.   nitrofurantoin (macrocrystal-monohydrate) 100 MG capsule Commonly known as: MACROBID Take 1 capsule (100 mg total) by mouth 2 (two) times daily.   nystatin cream Commonly known as: MYCOSTATIN Apply 1 application topically 2 (two) times daily.   pioglitazone 15 MG tablet Commonly known as: ACTOS Take 1 tablet by mouth daily.       Allergies:  Allergies  Allergen Reactions  . Aspirin Other (See Comments)    bleeding  . Sulfa Antibiotics Swelling  . Oxybutynin Other (See  Comments)    Dry mouth    Family History: Family History  Problem Relation Age of Onset  . Arthritis Mother   . Cancer Mother        colon  . Arthritis Father   . Cancer Father        colon  . Cancer Sister        lung  . Cancer Other        lung    Social History:  reports that she has never smoked. She has never used smokeless tobacco. She reports that she does not drink alcohol and does not use drugs.  ROS: Pertinent ROS in HPI  Physical Exam: There were no vitals taken for this visit.  Constitutional:  Well nourished. Alert and oriented, No acute distress. HEENT: [place] AT, moist mucus membranes.  Trachea midline, no masses. Cardiovascular: No clubbing, cyanosis, or  edema. Respiratory: Normal respiratory effort, no increased work of breathing. GI: Abdomen is soft, non tender, non distended, no abdominal masses. Liver and spleen not palpable.  No hernias appreciated.  Stool sample for occult testing is not indicated.   GU: No CVA tenderness.  No bladder fullness or masses.  *** external genitalia, *** pubic hair distribution, no lesions.  Normal urethral meatus, no lesions, no prolapse, no discharge.   No urethral masses, tenderness and/or tenderness. No bladder fullness, tenderness or masses. *** vagina mucosa, *** estrogen effect, no discharge, no lesions, *** pelvic support, *** cystocele and *** rectocele noted.  No cervical motion tenderness.  Uterus is freely mobile and non-fixed.  No adnexal/parametria masses or tenderness noted.  Anus and perineum are without rashes or lesions.   ***  Skin: No rashes, bruises or suspicious lesions. Lymph: No cervical or inguinal adenopathy. Neurologic: Grossly intact, no focal deficits, moving all 4 extremities. Psychiatric: Normal mood and affect.   Laboratory Data: Lab Results  Component Value Date   WBC 5.5 02/21/2018   HGB 11.4 02/21/2018   HCT 34.2 02/21/2018   MCV 100 (H) 02/21/2018   PLT 296 02/21/2018    Lab Results  Component Value Date   CREATININE 0.93 04/11/2016    Lab Results  Component Value Date   HGBA1C 7.9 11/16/2016    Lab Results  Component Value Date   TSH 1.63 07/09/2013       Component Value Date/Time   CHOL 149 08/18/2015 1359   HDL 56.90 08/18/2015 1359   CHOLHDL 3 08/18/2015 1359   VLDL 19.8 08/18/2015 1359   LDLCALC 72 08/18/2015 1359    Lab Results  Component Value Date   AST 21 10/25/2015   Lab Results  Component Value Date   ALT 16 10/25/2015    Urinalysis ***  I have reviewed the labs.   PTNS  Session # 11  Health & Social Factors: *** Caffeine: *** Alcohol: *** Daytime voids #per day: *** Night-time voids #per night: *** Urgency:  *** Incontinence Episodes #per day: *** Ankle used: *** Treatment Setting: *** Feeling/ Response: *** Comments: ***  Performed By: ***  Assistant: ***  Follow Up: ***  Assessment & Plan:  ***  1. OAB (overactive bladder) Continue PTNS RTC next week for PTNS # 12  2. rUTI's criteria for recurrent UTI has been met with 2 or more infections in 6 months or 3 or greater infections in one year   - patient is instructed to increase their water intake until the urine is pale yellow or clear (10 to 12 cups daily) ***  -  patient is instructed to take probiotics (yogurt, oral pills or vaginal suppositories), take cranberry pills or drink the juice and Vitamin C 1,000 mg daily to acidify the urine ***  - if using tampons, she should remove them prior to urinating and change them often ***  - avoid soaking in tubs and wipe front to back after urinating ***  - benefit from core strengthening exercises has been seen.  We can refer to PT if they desire ***  - advised them to have CATH UA's for urinalysis and culture to prevent skin, vaginal and/or rectal contamination of the specimen  - reviewed symptoms of UTI (fevers, chills, gross hematuria, mental status changes, dysuria, suprapubic pain, back pain and/or sudden worsening of urinary symptoms) and advised not to have urine checked or be treated for UTI if not experiencing symptoms  - discussed antibiotic stewardship with the patient - explained the risk of increasing risk of antibiotic resistance with continuous exposure to antibiotics, renal failure, hypoglycemia, C. Diff infection, allergic reactions, etc.                                          No follow-ups on file.  These notes generated with voice recognition software. I apologize for typographical errors.  Zara Council, PA-C  [facility] [address]  [place], [place] [address] [phone]

## 2020-05-23 ENCOUNTER — Ambulatory Visit: Payer: Medicare Other | Admitting: Urology

## 2020-05-30 ENCOUNTER — Other Ambulatory Visit: Payer: Self-pay

## 2020-05-30 ENCOUNTER — Ambulatory Visit: Payer: Medicare Other | Admitting: Urology

## 2020-05-30 ENCOUNTER — Ambulatory Visit (INDEPENDENT_AMBULATORY_CARE_PROVIDER_SITE_OTHER): Payer: Medicare Other | Admitting: Urology

## 2020-05-30 ENCOUNTER — Encounter: Payer: Self-pay | Admitting: Urology

## 2020-05-30 DIAGNOSIS — N3281 Overactive bladder: Secondary | ICD-10-CM | POA: Diagnosis not present

## 2020-05-30 NOTE — Progress Notes (Signed)
Patient ID: [name], female   DOB: 1931/07/03, 86 y.o.   MRN: [id number] PTNS  Session # 11  Health & Social Factors: no change Caffeine: 0 Alcohol: 0 Daytime voids #per day: 4-5 Night-time voids #per night: 3-5 Urgency: mild Incontinence Episodes #per day: 2-3 Ankle used: left Treatment Setting: 10 Feeling/ Response: toe flex Comments: patient tolerated well  Performed By: Edwin Dada, CMA   Follow Up: 1 week

## 2020-06-05 NOTE — Progress Notes (Signed)
PTNS  Session # 12  Health & Social Factors: no change  Caffeine: 0 Alcohol: 0 Daytime voids #per day: 4-5  Night-time voids #per night: 3-4 Urgency: mild Incontinence Episodes #per day: 2-3 Ankle used: left Treatment Setting: 3 Feeling/ Response: both Comments: patient tolerated well  Performed By: Edwin Dada, CMA  Assistant:   Follow Up: 1 month follow-up

## 2020-06-06 ENCOUNTER — Ambulatory Visit: Payer: Medicare Other | Admitting: Urology

## 2020-06-06 ENCOUNTER — Other Ambulatory Visit: Payer: Self-pay

## 2020-06-06 ENCOUNTER — Ambulatory Visit (INDEPENDENT_AMBULATORY_CARE_PROVIDER_SITE_OTHER): Payer: Medicare Other

## 2020-06-06 ENCOUNTER — Ambulatory Visit (INDEPENDENT_AMBULATORY_CARE_PROVIDER_SITE_OTHER): Payer: Medicare Other | Admitting: Urology

## 2020-06-06 DIAGNOSIS — E538 Deficiency of other specified B group vitamins: Secondary | ICD-10-CM | POA: Diagnosis not present

## 2020-06-06 DIAGNOSIS — N3281 Overactive bladder: Secondary | ICD-10-CM

## 2020-06-06 MED ORDER — CYANOCOBALAMIN 1000 MCG/ML IJ SOLN
1000.0000 ug | Freq: Once | INTRAMUSCULAR | Status: AC
  Administered 2020-06-06: 1000 ug via INTRAMUSCULAR

## 2020-06-09 DIAGNOSIS — H43813 Vitreous degeneration, bilateral: Secondary | ICD-10-CM | POA: Diagnosis not present

## 2020-06-09 DIAGNOSIS — E119 Type 2 diabetes mellitus without complications: Secondary | ICD-10-CM | POA: Diagnosis not present

## 2020-06-10 DIAGNOSIS — E1165 Type 2 diabetes mellitus with hyperglycemia: Secondary | ICD-10-CM | POA: Diagnosis not present

## 2020-06-10 DIAGNOSIS — E559 Vitamin D deficiency, unspecified: Secondary | ICD-10-CM | POA: Diagnosis not present

## 2020-06-10 DIAGNOSIS — E538 Deficiency of other specified B group vitamins: Secondary | ICD-10-CM | POA: Diagnosis not present

## 2020-06-10 LAB — HEMOGLOBIN A1C: Hemoglobin A1C: 7.5

## 2020-06-10 LAB — HM DIABETES FOOT EXAM: HM Diabetic Foot Exam: NORMAL

## 2020-06-13 ENCOUNTER — Ambulatory Visit: Payer: Medicare Other | Admitting: Urology

## 2020-06-13 LAB — HM DIABETES EYE EXAM

## 2020-06-20 ENCOUNTER — Ambulatory Visit: Payer: Medicare Other | Admitting: Urology

## 2020-06-27 ENCOUNTER — Ambulatory Visit: Payer: Medicare Other | Admitting: Urology

## 2020-06-28 DIAGNOSIS — Z23 Encounter for immunization: Secondary | ICD-10-CM | POA: Diagnosis not present

## 2020-07-04 ENCOUNTER — Ambulatory Visit: Payer: Medicare Other | Admitting: Urology

## 2020-07-09 NOTE — Progress Notes (Signed)
07/11/2020 3:29 PM   Brenda [name] 1931-01-18 [id number]  Referring provider: Juline Patch, MD [address] [place],  [place] [address]  Chief Complaint  Patient presents with  . Over Active Bladder    1 month follow up    HPI: Brenda [name] is a 86 y.o. female with OAB who has completed PTNS and presents today for follow up.    She feels the PTNS did not help her reach her treatment goals.   Unfortunately she was not able to be consistent with the weekly treatments, so that may be a reason why they were not helpful.  She also did not find Myrbetriq or trospium helpful in reaching her treatment goals.  Patient denies any modifying or aggravating factors.  Patient denies any gross hematuria, dysuria or suprapubic/flank pain.  Patient denies any fevers, chills, nausea or vomiting.   She continues to have frequency and incontinence.  She is having sugars in the 250's prior to bedtime.  Has issues with sleep.  She can go a day and half without sleep and some days sleep every time she sits down.   PMH: Past Medical History:  Diagnosis Date  . Arthritis   . Blood in stool   . Cancer ([place])   . Chicken pox   . Diabetes mellitus 2000  . Dry skin   . GERD (gastroesophageal reflux disease)   . Hiatal hernia   . Hypertension   . Kidney infection 2014  . Osteoporosis   . Ovarian cancer ([place]) 1991   s/p hysterectomy at [facility]  . Pernicious anemia   . Spinal stenosis   . Ulcer   . Urinary incontinence     Surgical History: Past Surgical History:  Procedure Laterality Date  . ABDOMINAL HYSTERECTOMY    . FOOT SURGERY  2000  . SHOULDER SURGERY  2013   fracture right shoulder    Home Medications:  Allergies as of 07/11/2020      Reactions   Aspirin Other (See Comments)   bleeding   Sulfa Antibiotics Swelling   Oxybutynin Other (See Comments)   Dry mouth      Medication List       Accurate as of July 11, 2020 11:59 PM. If you have any questions, ask your  nurse or doctor.        Accu-Chek Aviva Plus test strip Generic drug: glucose blood USE 1 STRIP TO CHECK GLUCOSE THREE TIMES DAILY   Accu-Chek Softclix Lancets lancets 3 (three) times daily. Use as instructed. E11.42   cetirizine 5 MG tablet Commonly known as: ZYRTEC Take 1 tablet (5 mg total) by mouth at bedtime.   cholecalciferol 1000 units tablet Commonly known as: VITAMIN D Take 4,000 Units by mouth daily.   cyanocobalamin 1000 MCG/ML injection Commonly known as: (VITAMIN B-12) Inject 1,000 mcg into the muscle every 30 (thirty) days.   ferrous sulfate 324 (65 Fe) MG Tbec Take 1 tablet (325 mg total) by mouth daily.   glipiZIDE 10 MG tablet Commonly known as: GLUCOTROL Take 1 tablet by mouth 2 (two) times a day.   lisinopril 5 MG tablet Commonly known as: ZESTRIL TAKE 1 TABLET(5 MG) BY MOUTH DAILY   metFORMIN 1000 MG tablet Commonly known as: GLUCOPHAGE Take 1 tablet (1,000 mg total) by mouth 2 (two) times daily with a meal.   nitrofurantoin (macrocrystal-monohydrate) 100 MG capsule Commonly known as: MACROBID Take 1 capsule (100 mg total) by mouth 2 (two) times daily.  nystatin cream Commonly known as: MYCOSTATIN Apply 1 application topically 2 (two) times daily.   pioglitazone 15 MG tablet Commonly known as: ACTOS Take 1 tablet by mouth daily.       Allergies:  Allergies  Allergen Reactions  . Aspirin Other (See Comments)    bleeding  . Sulfa Antibiotics Swelling  . Oxybutynin Other (See Comments)    Dry mouth    Family History: Family History  Problem Relation Age of Onset  . Arthritis Mother   . Cancer Mother        colon  . Arthritis Father   . Cancer Father        colon  . Cancer Sister        lung  . Cancer Other        lung    Social History:  reports that she has never smoked. She has never used smokeless tobacco. She reports that she does not drink alcohol and does not use drugs.  ROS: Pertinent ROS in HPI  Physical  Exam: BP (!) 168/79   Pulse (!) 105   Ht 5\' 5"  (1.651 m)   Wt 184 lb (83.5 kg)   BMI 30.62 kg/m   Constitutional:  Well nourished. Alert and oriented, No acute distress. HEENT: [place] AT, mask in place.  Trachea midline Cardiovascular: No clubbing, cyanosis, or edema. Respiratory: Normal respiratory effort, no increased work of breathing. Neurologic: Grossly intact, no focal deficits, moving all 4 extremities. Psychiatric: Normal mood and affect.   Laboratory Data: Lab Results  Component Value Date   WBC 5.5 02/21/2018   HGB 11.4 02/21/2018   HCT 34.2 02/21/2018   MCV 100 (H) 02/21/2018   PLT 296 02/21/2018    Lab Results  Component Value Date   CREATININE 0.93 04/11/2016    Lab Results  Component Value Date   HGBA1C 7.5 06/10/2020    Lab Results  Component Value Date   TSH 1.63 07/09/2013       Component Value Date/Time   CHOL 149 08/18/2015 1359   HDL 56.90 08/18/2015 1359   CHOLHDL 3 08/18/2015 1359   VLDL 19.8 08/18/2015 1359   LDLCALC 72 08/18/2015 1359    Lab Results  Component Value Date   AST 21 10/25/2015   Lab Results  Component Value Date   ALT 16 10/25/2015    Urinalysis    Component Value Date/Time   COLORURINE YELLOW 04/21/2020 1135   APPEARANCEUR CLOUDY (A) 04/21/2020 1135   APPEARANCEUR Clear 05/08/2014 0757   LABSPEC 1.020 04/21/2020 1135   LABSPEC 1.005 05/08/2014 0757   PHURINE 6.0 04/21/2020 1135   GLUCOSEU 100 (A) 04/21/2020 1135   GLUCOSEU Negative 05/08/2014 0757   HGBUR NEGATIVE 04/21/2020 1135   BILIRUBINUR negative 07/26/2020 1429   BILIRUBINUR Negative 05/08/2014 0757   KETONESUR NEGATIVE 04/21/2020 1135   PROTEINUR Negative 07/26/2020 1429   PROTEINUR NEGATIVE 04/21/2020 1135   UROBILINOGEN 0.2 07/26/2020 1429   NITRITE negative 07/26/2020 1429   NITRITE POSITIVE (A) 04/21/2020 1135   LEUKOCYTESUR Negative 07/26/2020 1429   LEUKOCYTESUR MODERATE (A) 04/21/2020 1135   LEUKOCYTESUR Trace 05/08/2014 0757    I have  reviewed the labs.   Pertinent Imaging: No recent imaging  Assessment & Plan:    1. OAB (overactive bladder) Offered patient a return appointment with Dr. Diamantina Providence or with Dr. Matilde Sprang to discuss further options for her urinary issues, but she deferred She states that she will contact us if she changes her mind  Return  if symptoms worsen or fail to improve.  These notes generated with voice recognition software. I apologize for typographical errors.  Zara Council, PA-C  [facility] [address]  [place], [place] [address] [phone]  I spent 20 minutes on the day of the encounter to include pre-visit record review, face-to-face time with the patient, and post-visit ordering of tests.

## 2020-07-11 ENCOUNTER — Ambulatory Visit: Payer: Medicare Other | Admitting: Urology

## 2020-07-11 ENCOUNTER — Ambulatory Visit (INDEPENDENT_AMBULATORY_CARE_PROVIDER_SITE_OTHER): Payer: Medicare Other | Admitting: Urology

## 2020-07-11 ENCOUNTER — Other Ambulatory Visit: Payer: Self-pay

## 2020-07-11 ENCOUNTER — Encounter: Payer: Self-pay | Admitting: Urology

## 2020-07-11 VITALS — BP 168/79 | HR 105 | Ht 65.0 in | Wt 184.0 lb

## 2020-07-11 DIAGNOSIS — N3281 Overactive bladder: Secondary | ICD-10-CM | POA: Diagnosis not present

## 2020-07-18 ENCOUNTER — Ambulatory Visit: Payer: Medicare Other | Admitting: Urology

## 2020-07-25 ENCOUNTER — Ambulatory Visit: Payer: Medicare Other | Admitting: Urology

## 2020-07-25 ENCOUNTER — Ambulatory Visit: Payer: Self-pay | Admitting: *Deleted

## 2020-07-25 NOTE — Telephone Encounter (Signed)
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 Reason for Disposition  [1] MODERATE dizziness (e.g., interferes with normal activities) AND [2] has been evaluated by physician for this  Answer Assessment - Initial Assessment Questions 1. DESCRIPTION: "Describe your dizziness."     Patient was working at table and got dizzy  2. LIGHTHEADED: "Do you feel lightheaded?" (e.g., somewhat faint, woozy, weak upon standing)     She did feel faint 3. VERTIGO: "Do you feel like either you or the room is spinning or tilting?" (i.e. vertigo)    Yes- felt table was moving 4. SEVERITY: "How bad is it?"  "Do you feel like you are going to faint?" "Can you stand and walk?"   - MILD: Feels slightly dizzy, but walking normally.   - MODERATE: Feels very unsteady when walking, but not falling; interferes with normal activities (e.g., school, work) .   - SEVERE: Unable to walk without falling, or requires assistance to walk without falling; feels like passing out now.      Patient laid down and she feels better 5. ONSET:  "When did the dizziness begin?"     today 6. AGGRAVATING FACTORS: "Does anything make it worse?" (e.g., standing, change in head position)     no 7. HEART RATE: "Can you tell me your heart rate?" "How many beats in 15 seconds?"  (Note: not all patients can do this)       178/80- has come down now 130/78, Now-153/78 70 8. CAUSE: "What do you think is causing the dizziness?"     unsure 9. RECURRENT SYMPTOM: "Have you had dizziness before?" If Yes, ask: "When was the last time?" "What happened that time?"     no 10. OTHER SYMPTOMS: "Do you have any other symptoms?" (e.g., fever, chest pain, vomiting, diarrhea, bleeding)       no 11. PREGNANCY:  "Is there any chance you are pregnant?" "When was your last menstrual period?"       n/a  Protocols used: DIZZINESS [facility]

## 2020-07-26 ENCOUNTER — Ambulatory Visit (INDEPENDENT_AMBULATORY_CARE_PROVIDER_SITE_OTHER): Payer: Medicare Other | Admitting: Family Medicine

## 2020-07-26 ENCOUNTER — Encounter: Payer: Self-pay | Admitting: Family Medicine

## 2020-07-26 ENCOUNTER — Other Ambulatory Visit: Payer: Self-pay

## 2020-07-26 VITALS — BP 140/90 | HR 72 | Resp 12 | Ht 65.0 in | Wt 182.0 lb

## 2020-07-26 DIAGNOSIS — H8110 Benign paroxysmal vertigo, unspecified ear: Secondary | ICD-10-CM

## 2020-07-26 DIAGNOSIS — D519 Vitamin B12 deficiency anemia, unspecified: Secondary | ICD-10-CM | POA: Diagnosis not present

## 2020-07-26 DIAGNOSIS — Z862 Personal history of diseases of the blood and blood-forming organs and certain disorders involving the immune mechanism: Secondary | ICD-10-CM | POA: Diagnosis not present

## 2020-07-26 DIAGNOSIS — I10 Essential (primary) hypertension: Secondary | ICD-10-CM | POA: Diagnosis not present

## 2020-07-26 DIAGNOSIS — R350 Frequency of micturition: Secondary | ICD-10-CM

## 2020-07-26 LAB — POCT URINALYSIS DIPSTICK
Bilirubin, UA: NEGATIVE
Blood, UA: NEGATIVE
Glucose, UA: NEGATIVE
Ketones, UA: NEGATIVE
Leukocytes, UA: NEGATIVE
Nitrite, UA: NEGATIVE
Protein, UA: NEGATIVE
Spec Grav, UA: 1.015 (ref 1.010–1.025)
Urobilinogen, UA: 0.2 E.U./dL
pH, UA: 6 (ref 5.0–8.0)

## 2020-07-26 MED ORDER — CYANOCOBALAMIN 1000 MCG/ML IJ SOLN
1000.0000 ug | Freq: Once | INTRAMUSCULAR | Status: AC
  Administered 2020-07-26: 1000 ug via INTRAMUSCULAR

## 2020-07-26 NOTE — Progress Notes (Signed)
Date:  07/26/2020   Name:  Brenda [name]   DOB:  Jan 27, 1931   MRN:  [id number]   Chief Complaint: Dizziness (had a "spell" that lasted about a minute yesterday morning. had breakfast and was doing some paperwork. )  Dizziness This is a new problem. The current episode started yesterday. Episode frequency: one episode. Associated symptoms include fatigue and vertigo. Pertinent negatives include no abdominal pain, anorexia, arthralgias, change in bowel habit, chest pain, chills, congestion, coughing, diaphoresis, fever, headaches, joint swelling, myalgias, nausea, neck pain, numbness, rash, sore throat, swollen glands, urinary symptoms, visual change, vomiting or weakness. Nothing aggravates the symptoms. She has tried nothing for the symptoms. The treatment provided mild relief.    Lab Results  Component Value Date   CREATININE 0.93 04/11/2016   BUN 16 04/11/2016   NA 139 04/11/2016   K 5.3 (H) 04/11/2016   CL 99 04/11/2016   CO2 26 04/11/2016   Lab Results  Component Value Date   CHOL 149 08/18/2015   HDL 56.90 08/18/2015   LDLCALC 72 08/18/2015   TRIG 99.0 08/18/2015   CHOLHDL 3 08/18/2015   Lab Results  Component Value Date   TSH 1.63 07/09/2013   Lab Results  Component Value Date   HGBA1C 7.5 06/10/2020   Lab Results  Component Value Date   WBC 5.5 02/21/2018   HGB 11.4 02/21/2018   HCT 34.2 02/21/2018   MCV 100 (H) 02/21/2018   PLT 296 02/21/2018   Lab Results  Component Value Date   ALT 16 10/25/2015   AST 21 10/25/2015   ALKPHOS 72 10/25/2015   BILITOT 0.2 05/07/2018     Review of Systems  Constitutional: Positive for fatigue. Negative for chills, diaphoresis, fever and unexpected weight change.  HENT: Negative for congestion, ear discharge, ear pain, rhinorrhea, sinus pressure, sneezing and sore throat.   Eyes: Negative for photophobia, pain, discharge, redness and itching.  Respiratory: Negative for cough, shortness of breath, wheezing and stridor.    Cardiovascular: Negative for chest pain.  Gastrointestinal: Negative for abdominal pain, anorexia, blood in stool, change in bowel habit, constipation, diarrhea, nausea and vomiting.  Endocrine: Negative for cold intolerance, heat intolerance, polydipsia, polyphagia and polyuria.  Genitourinary: Negative for dysuria, flank pain, frequency, hematuria, menstrual problem, pelvic pain, urgency, vaginal bleeding and vaginal discharge.  Musculoskeletal: Negative for arthralgias, back pain, joint swelling, myalgias and neck pain.  Skin: Negative for rash.  Allergic/Immunologic: Negative for environmental allergies and food allergies.  Neurological: Positive for dizziness and vertigo. Negative for weakness, light-headedness, numbness and headaches.  Hematological: Negative for adenopathy. Does not bruise/bleed easily.  Psychiatric/Behavioral: Negative for dysphoric mood. The patient is not nervous/anxious.     Patient Active Problem List   Diagnosis Date Noted  . Chronic obstructive pulmonary disease ([place]) 05/29/2019  . Hypertension associated with type 2 diabetes mellitus ([place]) 03/09/2019  . Diabetic peripheral neuropathy associated with type 2 diabetes mellitus ([place]) 07/25/2018  . Persistent microalbuminuria associated with type 2 diabetes mellitus ([place]) 07/22/2018  . Screening for breast cancer 08/18/2015  . B12 deficiency 12/02/2014  . Lumbago 07/09/2014  . Shortness of breath 06/30/2014  . Obese 06/30/2014  . Microalbuminuria 06/10/2014  . Right facial pain 05/18/2014  . Anemia 01/01/2014  . Osteoarthritis 07/31/2013  . Other malaise and fatigue 07/09/2013  . Muscle cramp, nocturnal 03/25/2013  . Increased frequency of urination 02/09/2013  . Urge incontinence 02/09/2013  . Medicare annual wellness visit, subsequent 12/02/2012  . Eczema 12/02/2012  .  Other and unspecified hyperlipidemia 12/02/2012  . Essential hypertension, benign 12/02/2012  . Chronic low back pain 08/28/2012  .  Osteoporosis 07/23/2012  . Nocturia 06/26/2012  . Paresthesia of bilateral legs 05/30/2012  . Pernicious anemia 05/30/2012  . Type 2 diabetes mellitus with hyperglycemia, without long-term current use of insulin ([place]) 05/30/2012  . Depression 05/30/2012  . Insomnia 05/30/2012  . Weakness of both legs 05/30/2012    Allergies  Allergen Reactions  . Aspirin Other (See Comments)    bleeding  . Sulfa Antibiotics Swelling  . Oxybutynin Other (See Comments)    Dry mouth    Past Surgical History:  Procedure Laterality Date  . ABDOMINAL HYSTERECTOMY    . FOOT SURGERY  2000  . SHOULDER SURGERY  2013   fracture right shoulder    Social History   Tobacco Use  . Smoking status: Never Smoker  . Smokeless tobacco: Never Used  . Tobacco comment: smoking cessation materials not required  Vaping Use  . Vaping Use: Never used  Substance Use Topics  . Alcohol use: No  . Drug use: No     Medication list has been reviewed and updated.  Current Meds  Medication Sig  . ACCU-CHEK AVIVA PLUS test strip USE 1 STRIP TO CHECK GLUCOSE THREE TIMES DAILY  . Accu-Chek Softclix Lancets lancets 3 (three) times daily. Use as instructed. E11.42  . cetirizine (ZYRTEC) 5 MG tablet Take 1 tablet (5 mg total) by mouth at bedtime.  . cholecalciferol (VITAMIN D) 1000 units tablet Take 4,000 Units by mouth daily.   . cyanocobalamin (,VITAMIN B-12,) 1000 MCG/ML injection Inject 1,000 mcg into the muscle every 30 (thirty) days.  . ferrous sulfate 324 (65 Fe) MG TBEC Take 1 tablet (325 mg total) by mouth daily.  Marland Kitchen glipiZIDE (GLUCOTROL) 10 MG tablet Take 1 tablet by mouth 2 (two) times a day.  . lisinopril (ZESTRIL) 5 MG tablet TAKE 1 TABLET(5 MG) BY MOUTH DAILY  . metFORMIN (GLUCOPHAGE) 1000 MG tablet Take 1 tablet (1,000 mg total) by mouth 2 (two) times daily with a meal.  . nystatin cream (MYCOSTATIN) Apply 1 application topically 2 (two) times daily.  . pioglitazone (ACTOS) 15 MG tablet Take 1 tablet by  mouth daily.    PHQ 2/9 Scores 07/26/2020 03/09/2020 02/18/2020 03/09/2019  PHQ - 2 Score 0 1 0 2  PHQ- 9 Score 5 5 0 6    GAD 7 : Generalized Anxiety Score 07/26/2020 02/18/2020  Nervous, Anxious, on Edge 1 0  Control/stop worrying 1 0  Worry too much - different things 0 0  Trouble relaxing 1 0  Restless 0 0  Easily annoyed or irritable 0 0  Afraid - awful might happen 0 0  Total GAD 7 Score 3 0  Anxiety Difficulty Somewhat difficult -    BP Readings from Last 3 Encounters:  07/26/20 140/90  07/11/20 (!) 168/79  04/21/20 (!) 158/77    Physical Exam Vitals and nursing note reviewed.  Constitutional:      Appearance: She is well-developed.  HENT:     Head: Normocephalic.     Right Ear: External ear normal.     Left Ear: External ear normal.  Eyes:     General: Lids are everted, no foreign bodies appreciated. No scleral icterus.       Left eye: No foreign body or hordeolum.     Conjunctiva/sclera: Conjunctivae normal.     Right eye: Right conjunctiva is not injected.     Left  eye: Left conjunctiva is not injected.     Pupils: Pupils are equal, round, and reactive to light.  Neck:     Thyroid: No thyromegaly.     Vascular: No JVD.     Trachea: No tracheal deviation.  Cardiovascular:     Rate and Rhythm: Normal rate and regular rhythm.     Heart sounds: Normal heart sounds. No murmur heard.  No friction rub. No gallop.   Pulmonary:     Effort: Pulmonary effort is normal. No respiratory distress.     Breath sounds: Normal breath sounds. No wheezing, rhonchi or rales.  Abdominal:     General: Bowel sounds are normal.     Palpations: Abdomen is soft. There is no mass.     Tenderness: There is no abdominal tenderness. There is no guarding or rebound.  Musculoskeletal:        General: No tenderness. Normal range of motion.     Cervical back: Normal range of motion and neck supple.  Lymphadenopathy:     Cervical: No cervical adenopathy.  Skin:    General: Skin is  warm.     Findings: No rash.  Neurological:     Mental Status: She is alert and oriented to person, place, and time.     Cranial Nerves: No cranial nerve deficit.     Deep Tendon Reflexes: Reflexes normal.  Psychiatric:        Mood and Affect: Mood is not anxious or depressed.     Wt Readings from Last 3 Encounters:  07/26/20 182 lb (82.6 kg)  07/11/20 184 lb (83.5 kg)  04/21/20 192 lb (87.1 kg)    BP 140/90   Pulse 72   Ht 5\' 5"  (1.651 m)   Wt 182 lb (82.6 kg)   BMI 30.29 kg/m   Assessment and Plan: Patient's chart was reviewed for previous encounters as well as most recent labs and imaging. 1. Benign paroxysmal positional vertigo, unspecified laterality Patient describes a classic vertigo episode which was short in duration of a few minutes. Patient has total resolution with no further neurologic concerns. This most likely represents benign positional vertigo and we will not treat with medication at this time due to sedation possibilities. We will continue to watch and if it continues further intervention with ENT may be done. I am reluctant to do Epley maneuvers with her given her age.  2. Urinary frequency Patient has a history of UTIs and has some frequency but I think this is probably due to overactive bladder. We will check a urinalysis which showed no evidence of leukocytes suggesting cystitis. - POCT urinalysis dipstick  3. History of anemia Patient has a history of anemia and we will do a CBC to evaluate for possibility of reemergence. - CBC with Differential/Platelet  4. Essential hypertension, benign Chronic. Controlled. Stable. Blood pressure today is 140/90 although this is elevated I am reluctant to increase medication with the risk of orthostatic hypotension and her current current circumstances. - Renal Function Panel  5. Anemia due to vitamin B12 deficiency, unspecified B12 deficiency type Patient is presenting for injection of her B12 which will be done  today. - cyanocobalamin ((VITAMIN B-12)) injection 1,000 mcg

## 2020-07-26 NOTE — Patient Instructions (Signed)
Benign Positional Vertigo Vertigo is the feeling that you or your surroundings are moving when they are not. Benign positional vertigo is the most common form of vertigo. This is usually a harmless condition (benign). This condition is positional. This means that symptoms are triggered by certain movements and positions. This condition can be dangerous if it occurs while you are doing something that could cause harm to you or others. This includes activities such as driving or operating machinery. What are the causes? In many cases, the cause of this condition is not known. It may be caused by a disturbance in an area of the inner ear that helps your brain to sense movement and balance. This disturbance can be caused by:  Viral infection (labyrinthitis).  Head injury.  Repetitive motion, such as jumping, dancing, or running. What increases the risk? You are more likely to develop this condition if:  You are a woman.  You are 50 years of age or older. What are the signs or symptoms? Symptoms of this condition usually happen when you move your head or your eyes in different directions. Symptoms may start suddenly, and usually last for less than a minute. They include:  Loss of balance and falling.  Feeling like you are spinning or moving.  Feeling like your surroundings are spinning or moving.  Nausea and vomiting.  Blurred vision.  Dizziness.  Involuntary eye movement (nystagmus). Symptoms can be mild and cause only minor problems, or they can be severe and interfere with daily life. Episodes of benign positional vertigo may return (recur) over time. Symptoms may improve over time. How is this diagnosed? This condition may be diagnosed based on:  Your medical history.  Physical exam of the head, neck, and ears.  Tests, such as: ? MRI. ? CT scan. ? Eye movement tests. Your health care provider may ask you to change positions quickly while he or she watches you for symptoms  of benign positional vertigo, such as nystagmus. Eye movement may be tested with a variety of exams that are designed to evaluate or stimulate vertigo. ? An electroencephalogram (EEG). This records electrical activity in your brain. ? Hearing tests. You may be referred to a health care provider who specializes in ear, nose, and throat (ENT) problems (otolaryngologist) or a provider who specializes in disorders of the nervous system (neurologist). How is this treated?  This condition may be treated in a session in which your health care provider moves your head in specific positions to adjust your inner ear back to normal. Treatment for this condition may take several sessions. Surgery may be needed in severe cases, but this is rare. In some cases, benign positional vertigo may resolve on its own in 2-4 weeks. Follow these instructions at home: Safety  Move slowly. Avoid sudden body or head movements or certain positions, as told by your health care provider.  Avoid driving until your health care provider says it is safe for you to do so.  Avoid operating heavy machinery until your health care provider says it is safe for you to do so.  Avoid doing any tasks that would be dangerous to you or others if vertigo occurs.  If you have trouble walking or keeping your balance, try using a cane for stability. If you feel dizzy or unstable, sit down right away.  Return to your normal activities as told by your health care provider. Ask your health care provider what activities are safe for you. General instructions  Take over-the-counter   and prescription medicines only as told by your health care provider.  Drink enough fluid to keep your urine pale yellow.  Keep all follow-up visits as told by your health care provider. This is important. Contact a health care provider if:  You have a fever.  Your condition gets worse or you develop new symptoms.  Your family or friends notice any  behavioral changes.  You have nausea or vomiting that gets worse.  You have numbness or a "pins and needles" sensation. Get help right away if you:  Have difficulty speaking or moving.  Are always dizzy.  Faint.  Develop severe headaches.  Have weakness in your legs or arms.  Have changes in your hearing or vision.  Develop a stiff neck.  Develop sensitivity to light. Summary  Vertigo is the feeling that you or your surroundings are moving when they are not. Benign positional vertigo is the most common form of vertigo.  The cause of this condition is not known. It may be caused by a disturbance in an area of the inner ear that helps your brain to sense movement and balance.  Symptoms include loss of balance and falling, feeling that you or your surroundings are moving, nausea and vomiting, and blurred vision.  This condition can be diagnosed based on symptoms, physical exam, and other tests, such as MRI, CT scan, eye movement tests, and hearing tests.  Follow safety instructions as told by your health care provider. You will also be told when to contact your health care provider in case of problems. This information is not intended to replace advice given to you by your health care provider. Make sure you discuss any questions you have with your health care provider. Document Revised: 02/05/2018 Document Reviewed: 02/05/2018 Elsevier Patient Education  2020 [employer]  

## 2020-07-27 ENCOUNTER — Encounter: Payer: Self-pay | Admitting: Family Medicine

## 2020-08-03 ENCOUNTER — Other Ambulatory Visit: Payer: Self-pay | Admitting: Family Medicine

## 2020-08-03 DIAGNOSIS — I10 Essential (primary) hypertension: Secondary | ICD-10-CM | POA: Diagnosis not present

## 2020-08-03 DIAGNOSIS — Z862 Personal history of diseases of the blood and blood-forming organs and certain disorders involving the immune mechanism: Secondary | ICD-10-CM | POA: Diagnosis not present

## 2020-08-04 LAB — CBC WITH DIFFERENTIAL/PLATELET
Basophils Absolute: 0.1 10*3/uL (ref 0.0–0.2)
Basos: 1 %
EOS (ABSOLUTE): 0.3 10*3/uL (ref 0.0–0.4)
Eos: 4 %
Hematocrit: 35.7 % (ref 34.0–46.6)
Hemoglobin: 12.5 g/dL (ref 11.1–15.9)
Immature Grans (Abs): 0 10*3/uL (ref 0.0–0.1)
Immature Granulocytes: 0 %
Lymphocytes Absolute: 1.7 10*3/uL (ref 0.7–3.1)
Lymphs: 26 %
MCH: 35.1 pg — ABNORMAL HIGH (ref 26.6–33.0)
MCHC: 35 g/dL (ref 31.5–35.7)
MCV: 100 fL — ABNORMAL HIGH (ref 79–97)
Monocytes Absolute: 0.4 10*3/uL (ref 0.1–0.9)
Monocytes: 7 %
Neutrophils Absolute: 4 10*3/uL (ref 1.4–7.0)
Neutrophils: 62 %
Platelets: 249 10*3/uL (ref 150–450)
RBC: 3.56 x10E6/uL — ABNORMAL LOW (ref 3.77–5.28)
RDW: 10.7 % — ABNORMAL LOW (ref 11.7–15.4)
WBC: 6.4 10*3/uL (ref 3.4–10.8)

## 2020-08-04 LAB — RENAL FUNCTION PANEL
Albumin: 3.9 g/dL (ref 3.6–4.6)
BUN/Creatinine Ratio: 20 (ref 12–28)
BUN: 19 mg/dL (ref 8–27)
CO2: 24 mmol/L (ref 20–29)
Calcium: 10.8 mg/dL — ABNORMAL HIGH (ref 8.7–10.3)
Chloride: 100 mmol/L (ref 96–106)
Creatinine, Ser: 0.96 mg/dL (ref 0.57–1.00)
GFR calc Af Amer: 61 mL/min/{1.73_m2} (ref >59)
GFR calc non Af Amer: 53 mL/min/{1.73_m2} — ABNORMAL LOW (ref >59)
Glucose: 181 mg/dL — ABNORMAL HIGH (ref 65–99)
Phosphorus: 3.1 mg/dL (ref 3.0–4.3)
Potassium: 5.7 mmol/L — ABNORMAL HIGH (ref 3.5–5.2)
Sodium: 138 mmol/L (ref 134–144)

## 2020-08-09 ENCOUNTER — Telehealth: Payer: Self-pay

## 2020-08-09 NOTE — Telephone Encounter (Signed)
Copied from [id number] [id number] [phone]. Topic: Quick Communication - Lab Results (Clinic Use ONLY) >> Aug 09, 2020  8:03 AM Lennox Solders wrote: Pt is calling and would like blood work results from  08-03-2020

## 2020-08-09 NOTE — Telephone Encounter (Signed)
Called pt with labs. Per Dr. Ronnald Ramp "As lab numbers have improved/acceptable patient is followed by endocrinology for diabetes". Pt verbalized understanding.  KP

## 2020-08-26 DIAGNOSIS — I739 Peripheral vascular disease, unspecified: Secondary | ICD-10-CM | POA: Diagnosis not present

## 2020-08-26 DIAGNOSIS — R0602 Shortness of breath: Secondary | ICD-10-CM | POA: Diagnosis not present

## 2020-08-31 DIAGNOSIS — E1142 Type 2 diabetes mellitus with diabetic polyneuropathy: Secondary | ICD-10-CM | POA: Diagnosis not present

## 2020-08-31 DIAGNOSIS — J449 Chronic obstructive pulmonary disease, unspecified: Secondary | ICD-10-CM | POA: Diagnosis not present

## 2020-08-31 DIAGNOSIS — R0600 Dyspnea, unspecified: Secondary | ICD-10-CM | POA: Diagnosis not present

## 2020-08-31 DIAGNOSIS — I10 Essential (primary) hypertension: Secondary | ICD-10-CM | POA: Diagnosis not present

## 2020-08-31 DIAGNOSIS — E1159 Type 2 diabetes mellitus with other circulatory complications: Secondary | ICD-10-CM | POA: Diagnosis not present

## 2020-08-31 DIAGNOSIS — I152 Hypertension secondary to endocrine disorders: Secondary | ICD-10-CM | POA: Diagnosis not present

## 2020-08-31 DIAGNOSIS — R252 Cramp and spasm: Secondary | ICD-10-CM | POA: Diagnosis not present

## 2020-09-15 LAB — HEMOGLOBIN A1C: Hemoglobin A1C: 7.2

## 2020-09-16 DIAGNOSIS — E1165 Type 2 diabetes mellitus with hyperglycemia: Secondary | ICD-10-CM | POA: Diagnosis not present

## 2020-09-16 DIAGNOSIS — E1142 Type 2 diabetes mellitus with diabetic polyneuropathy: Secondary | ICD-10-CM | POA: Diagnosis not present

## 2020-09-16 DIAGNOSIS — E1159 Type 2 diabetes mellitus with other circulatory complications: Secondary | ICD-10-CM | POA: Diagnosis not present

## 2020-09-16 DIAGNOSIS — E559 Vitamin D deficiency, unspecified: Secondary | ICD-10-CM | POA: Diagnosis not present

## 2020-09-16 DIAGNOSIS — E1129 Type 2 diabetes mellitus with other diabetic kidney complication: Secondary | ICD-10-CM | POA: Diagnosis not present

## 2020-09-16 DIAGNOSIS — R809 Proteinuria, unspecified: Secondary | ICD-10-CM | POA: Diagnosis not present

## 2020-09-16 DIAGNOSIS — E538 Deficiency of other specified B group vitamins: Secondary | ICD-10-CM | POA: Diagnosis not present

## 2020-09-16 DIAGNOSIS — I152 Hypertension secondary to endocrine disorders: Secondary | ICD-10-CM | POA: Diagnosis not present

## 2020-09-16 DIAGNOSIS — E669 Obesity, unspecified: Secondary | ICD-10-CM | POA: Diagnosis not present

## 2020-10-12 DIAGNOSIS — Z23 Encounter for immunization: Secondary | ICD-10-CM | POA: Diagnosis not present

## 2020-10-15 ENCOUNTER — Other Ambulatory Visit: Payer: Self-pay

## 2020-10-15 ENCOUNTER — Ambulatory Visit
Admission: EM | Admit: 2020-10-15 | Discharge: 2020-10-15 | Disposition: A | Discharge status: Home / Self Care | Payer: Medicare Other | Attending: Physician Assistant | Admitting: Physician Assistant

## 2020-10-15 ENCOUNTER — Encounter: Payer: Self-pay | Admitting: Emergency Medicine

## 2020-10-15 DIAGNOSIS — R42 Dizziness and giddiness: Secondary | ICD-10-CM | POA: Insufficient documentation

## 2020-10-15 DIAGNOSIS — R079 Chest pain, unspecified: Secondary | ICD-10-CM | POA: Diagnosis not present

## 2020-10-15 DIAGNOSIS — N3001 Acute cystitis with hematuria: Secondary | ICD-10-CM | POA: Diagnosis not present

## 2020-10-15 DIAGNOSIS — I10 Essential (primary) hypertension: Secondary | ICD-10-CM | POA: Insufficient documentation

## 2020-10-15 DIAGNOSIS — E119 Type 2 diabetes mellitus without complications: Secondary | ICD-10-CM | POA: Insufficient documentation

## 2020-10-15 LAB — URINALYSIS, COMPLETE (UACMP) WITH MICROSCOPIC
Bilirubin Urine: NEGATIVE
Glucose, UA: NEGATIVE mg/dL
Nitrite: POSITIVE — AB
Protein, ur: NEGATIVE mg/dL
Specific Gravity, Urine: 1.025 (ref 1.005–1.030)
WBC, UA: >50 WBC/hpf (ref 0–5)
pH: 5 (ref 5.0–8.0)

## 2020-10-15 LAB — GLUCOSE, CAPILLARY: Glucose-Capillary: 139 mg/dL — ABNORMAL HIGH (ref 70–99)

## 2020-10-15 MED ORDER — CEPHALEXIN 500 MG PO CAPS: 500.0000 mg | ORAL_CAPSULE | Freq: Two times a day (BID) | ORAL | 0 refills | Status: AC

## 2020-10-15 NOTE — ED Triage Notes (Signed)
Pt states last night when she went to bed last night her blood sugar was high then she woke in the middle of the night and checked her sugar it was 65. She did not eat anything before going back to bed. When she got up she was dizzy. She ate a peanut butter sandwich and felt better but she still felt tired.

## 2020-10-15 NOTE — ED Provider Notes (Signed)
[facility]    CSN: [id number] Arrival date & time: 10/15/20  1425      History   Chief Complaint Chief Complaint  Patient presents with   Hypoglycemia    HPI Brenda [name] is a 87 y.o. female presenting with her son for dizziness last night.  Patient states that she woke up last night and felt dizzy.  She says that she checked her blood sugar and it was 65 but was somewhere in the upper 100s to 200 before she went to sleep that night.  She says that she then ate a peanut butter sandwich and rechecked her blood sugar and it went up above 100.  She says that she still felt, fatigued and slightly dizzy at that time.  Denied any chest pain, syncope or presyncope, headache, vision changes, nausea/vomiting/diarrhea, abdominal pain, difficulty breathing.  She does not have any other symptoms now either.  She says that she is not feeling dizzy now.  She does admit to urinary frequency, but says that she has an overactive bladder.  Denies any pain with urination.  Has not had any fevers or back pain.  She says that her blood pressures have also been high but her blood pressure is sometimes difficult to manage.  She has a cuff at home but is not sure if it is accurate.  Past medical history significant for type 2 diabetes, hypertension, GERD, overactive bladder, B12 deficiency, insomnia.  She has no other concerns today.  HPI  Past Medical History:  Diagnosis Date   Arthritis    Blood in stool    Cancer ([place])    Chicken pox    Diabetes mellitus 2000   Dry skin    GERD (gastroesophageal reflux disease)    Hiatal hernia    Hypertension    Kidney infection 2014   Osteoporosis    Ovarian cancer ([place]) 1991   s/p hysterectomy at [facility]   Pernicious anemia    Spinal stenosis    Ulcer    Urinary incontinence     Patient Active Problem List   Diagnosis Date Noted   Chronic obstructive pulmonary disease ([place]) 05/29/2019   Hypertension associated with type 2  diabetes mellitus ([place]) 03/09/2019   Diabetic peripheral neuropathy associated with type 2 diabetes mellitus ([place]) 07/25/2018   Persistent microalbuminuria associated with type 2 diabetes mellitus ([place]) 07/22/2018   Screening for breast cancer 08/18/2015   B12 deficiency 12/02/2014   Lumbago 07/09/2014   Shortness of breath 06/30/2014   Obese 06/30/2014   Microalbuminuria 06/10/2014   Right facial pain 05/18/2014   Anemia 01/01/2014   Osteoarthritis 07/31/2013   Other malaise and fatigue 07/09/2013   Muscle cramp, nocturnal 03/25/2013   Increased frequency of urination 02/09/2013   Urge incontinence 02/09/2013   Medicare annual wellness visit, subsequent 12/02/2012   Eczema 12/02/2012   Other and unspecified hyperlipidemia 12/02/2012   Essential hypertension, benign 12/02/2012   Chronic low back pain 08/28/2012   Osteoporosis 07/23/2012   Nocturia 06/26/2012   Paresthesia of bilateral legs 05/30/2012   Pernicious anemia 05/30/2012   Type 2 diabetes mellitus with hyperglycemia, without long-term current use of insulin ([place]) 05/30/2012   Depression 05/30/2012   Insomnia 05/30/2012   Weakness of both legs 05/30/2012    Past Surgical History:  Procedure Laterality Date   ABDOMINAL HYSTERECTOMY     FOOT SURGERY  2000   SHOULDER SURGERY  2013   fracture right shoulder    OB History   No  obstetric history on file.      Home Medications    Prior to Admission medications   Medication Sig Start Date End Date Taking? Authorizing Provider  cephALEXin (KEFLEX) 500 MG capsule Take 1 capsule (500 mg total) by mouth 2 (two) times daily for 7 days. 10/15/20 10/22/20 Yes Danton Clap, PA-C  cetirizine (ZYRTEC) 5 MG tablet Take 1 tablet (5 mg total) by mouth at bedtime. 04/21/20  Yes Cook, Jayce G, DO  cholecalciferol (VITAMIN D) 1000 units tablet Take 4,000 Units by mouth daily.    Yes [provider]  cyanocobalamin (,VITAMIN B-12,) 1000  MCG/ML injection Inject 1,000 mcg into the muscle every 30 (thirty) days.   Yes [provider]  ferrous sulfate 324 (65 Fe) MG TBEC Take 1 tablet (325 mg total) by mouth daily. 04/11/16  Yes Juline Patch, MD  glipiZIDE (GLUCOTROL) 10 MG tablet Take 1 tablet by mouth 2 (two) times a day. 01/16/19  Yes [provider]  lisinopril (ZESTRIL) 5 MG tablet TAKE 1 TABLET(5 MG) BY MOUTH DAILY 04/30/20  Yes Juline Patch, MD  metFORMIN (GLUCOPHAGE) 1000 MG tablet Take 1 tablet (1,000 mg total) by mouth 2 (two) times daily with a meal. 04/11/16  Yes Juline Patch, MD  pioglitazone (ACTOS) 15 MG tablet Take 1 tablet by mouth daily. 01/24/19  Yes [provider]  ACCU-CHEK AVIVA PLUS test strip USE 1 STRIP TO [place] DAILY 12/09/18   [provider]  Accu-Chek Softclix Lancets lancets 3 (three) times daily. Use as instructed. E11.42 05/29/17   [provider]  nystatin cream (MYCOSTATIN) Apply 1 application topically 2 (two) times daily. 05/29/19   Juline Patch, MD    Family History Family History  Problem Relation Age of Onset   Arthritis Mother    Cancer Mother        colon   Arthritis Father    Cancer Father        colon   Cancer Sister        lung   Cancer Other        lung    Social History Social History   Tobacco Use   Smoking status: Never Smoker   Smokeless tobacco: Never Used   Tobacco comment: smoking cessation materials not required  Vaping Use   Vaping Use: Never used  Substance Use Topics   Alcohol use: No   Drug use: No     Allergies   Aspirin, Sulfa antibiotics, and Oxybutynin   Review of Systems Review of Systems  Constitutional: Positive for fatigue. Negative for chills, diaphoresis and fever.  HENT: Negative for congestion, ear pain, rhinorrhea, sinus pressure, sinus pain and sore throat.   Respiratory: Negative for cough and shortness of breath.   Cardiovascular: Negative for chest pain  and palpitations.  Gastrointestinal: Negative for abdominal pain, nausea and vomiting.  Genitourinary: Positive for frequency and urgency. Negative for dysuria and flank pain.  Musculoskeletal: Negative for arthralgias and myalgias.  Skin: Negative for rash.  Neurological: Positive for dizziness. Negative for syncope, weakness, numbness and headaches.  Hematological: Negative for adenopathy.     Physical Exam Triage Vital Signs ED Triage Vitals  Enc Vitals Group     BP 10/15/20 1448 (!) 165/79     Pulse Rate 10/15/20 1448 73     Resp 10/15/20 1448 18     Temp 10/15/20 1448 98.4 F (36.9 C)     Temp Source 10/15/20 1448 Oral  SpO2 10/15/20 1448 100 %     Weight 10/15/20 1449 182 lb 1.6 oz (82.6 kg)     Height 10/15/20 1449 5\' 5"  (1.651 m)     Head Circumference --      Peak Flow --      Pain Score 10/15/20 1449 0     Pain Loc --      Pain Edu? --      Excl. in [place]? --    No data found.  Updated Vital Signs BP (!) 147/78 (BP Location: Right Arm)    Pulse 73    Temp 98.4 F (36.9 C) (Oral)    Resp 18    Ht 5\' 5"  (1.651 m)    Wt 182 lb 1.6 oz (82.6 kg)    SpO2 100%    BMI 30.30 kg/m       Physical Exam Vitals and nursing note reviewed.  Constitutional:      General: She is not in acute distress.    Appearance: Normal appearance. She is not ill-appearing or toxic-appearing.  HENT:     Head: Normocephalic and atraumatic.     Right Ear: Tympanic membrane, ear canal and external ear normal.     Left Ear: Tympanic membrane, ear canal and external ear normal.     Nose: Nose normal.     Mouth/Throat:     Mouth: Mucous membranes are moist.     Pharynx: Oropharynx is clear.  Eyes:     General: No scleral icterus.       Right eye: No discharge.        Left eye: No discharge.     Extraocular Movements: Extraocular movements intact.     Conjunctiva/sclera: Conjunctivae normal.     Pupils: Pupils are equal, round, and reactive to light.  Cardiovascular:     Rate and  Rhythm: Normal rate and regular rhythm.     Heart sounds: Normal heart sounds.  Pulmonary:     Effort: Pulmonary effort is normal. No respiratory distress.     Breath sounds: Normal breath sounds.  Abdominal:     Palpations: Abdomen is soft.     Tenderness: There is no abdominal tenderness. There is no right CVA tenderness or left CVA tenderness.  Musculoskeletal:     Cervical back: Neck supple.  Skin:    General: Skin is dry.  Neurological:     General: No focal deficit present.     Mental Status: She is alert. Mental status is at baseline.     Motor: No weakness.     Gait: Gait normal.  Psychiatric:        Mood and Affect: Mood normal.        Behavior: Behavior normal.        Thought Content: Thought content normal.      UC Treatments / Results  Labs (all labs ordered are listed, but only abnormal results are displayed) Labs Reviewed  GLUCOSE, CAPILLARY - Abnormal; Notable for the following components:      Result Value   Glucose-Capillary 139 (*)    All other components within normal limits  URINALYSIS, COMPLETE (UACMP) WITH MICROSCOPIC - Abnormal; Notable for the following components:   APPearance CLOUDY (*)    Hgb urine dipstick TRACE (*)    Ketones, ur TRACE (*)    Nitrite POSITIVE (*)    Leukocytes,Ua SMALL (*)    Bacteria, UA MANY (*)    All other components within normal limits  URINE CULTURE  CBG MONITORING, ED    EKG   Radiology No results found.  Procedures ED EKG  Date/Time: 10/15/2020 3:36 PM Performed by: Danton Clap, PA-C Authorized by: Danton Clap, PA-C   ECG reviewed by ED Physician in the absence of a cardiologist: yes   Previous ECG:    Previous ECG:  Unavailable Interpretation:    Interpretation: abnormal   Rate:    ECG rate:  96   ECG rate assessment: normal   Rhythm:    Rhythm: sinus rhythm and A-V block     A-V block: 1st Degree   Ectopy:    Ectopy: none   QRS:    QRS axis:  Normal   QRS intervals:  Normal   QRS  conduction: normal   ST segments:    ST segments:  Normal T waves:    T waves: normal   Comments:     Normal sinus rhythm. Regular rate. 1st degree AV block   (including critical care time)  Medications Ordered in UC Medications - No data to display  Initial Impression / Assessment and Plan / UC Course  I have reviewed the triage vital signs and the nursing notes.  Pertinent labs & imaging results that were available during my care of the patient were reviewed by me and considered in my medical decision making (see chart for details).   87 year old female presenting for low blood sugar in the middle the night last night and feeling dizzy and tired.  She has been feeling better since he may be a virus and was, but stated she still wanted to just get checked out.  In the clinic blood pressure is elevated at 165/79.  Recheck blood pressure is 147/78. She is afebrile.  Pulse 73 bpm and oxygen 100%.  Patient is well-appearing and in no acute distress.  Exam is benign.  Fingerstick glucose is 139.  EKG shows normal sinus rhythm and regular rate.  There is first-degree AV block.  Do not see any previous EKGs to compare to.  No dysrhythmias and no ST T wave changes.  Urinalysis shows cloudy urine.  There is trace blood, trace ketones, positive nitrites, and small leukocytes.  Urine will be sent for culture.  Discussed results with patient.  Advised her that her blood sugar is not low at this time and we discussed ways to correct her sugar up when it does get low.  Advised to keep a log of the blood sugars throughout the day and follow-up with primary care provider.  ED precautions discussed for hypoglycemia that is difficult to bring up at home.  Hemoglobin A1c on 06/10/2020 was 7.5.  Advised patient to continue regular medications.  EKG results discussed with patient.  Advised her nothing worrisome on EKG but she should follow-up with PCP about first-degree AV block.  Red flag signs and symptoms  for dizziness discussed with patient.  Also discussed going to ED if she has any chest pain or breathing difficulty or palpitations.  Urinalysis performed today shows a urinary tract infection.  Patient has had multiple urinary tract infections over the past year.  She says that her antibiotic office to be changed because she has a lot of resistance.  The last UTI I see patient treated for was 04/21/2020.  She took Macrobid at that time.  Treating patient with Keflex at this time.  Advised him I will call her with the culture results if her antibiotic needs to be changed.  Advised increasing fluids and  rest.  Advised to follow with our department as needed.  Final Clinical Impressions(s) / UC Diagnoses   Final diagnoses:  Acute cystitis with hematuria  Dizziness  Type 2 diabetes mellitus without complication, without long-term current use of insulin ([place])  Essential hypertension     Discharge Instructions     Your blood sugar is 139 in the clinic today.  This is not low.  If your sugar does get low try to drink some orange juice or crackers and peanut butter.  Make sure you are staying hydrated.  Keep a check on your blood pressure since it was elevated in the clinic too.  It did come down to 147/78 which was better.  An EKG was not indicative of any major changes.  Your exam is normal.  A urinalysis shows that you have a UTI.  We will send a culture.  I am sending antibiotic this time, but it may need to be changed.  Continue to rest and increase fluids.  Follow-up with your PCP about your blood pressure and blood sugars.  Go to ED for any significant changes.    ED Prescriptions    Medication Sig Dispense Auth. Provider   cephALEXin (KEFLEX) 500 MG capsule Take 1 capsule (500 mg total) by mouth 2 (two) times daily for 7 days. 14 capsule Danton Clap, PA-C     PDMP not reviewed this encounter.   Danton Clap, PA-C 10/16/20 [id number]

## 2020-10-15 NOTE — Discharge Instructions (Signed)
Your blood sugar is 139 in the clinic today.  This is not low.  If your sugar does get low try to drink some orange juice or crackers and peanut butter.  Make sure you are staying hydrated.  Keep a check on your blood pressure since it was elevated in the clinic too.  It did come down to 147/78 which was better.  An EKG was not indicative of any major changes.  Your exam is normal.  A urinalysis shows that you have a UTI.  We will send a culture.  I am sending antibiotic this time, but it may need to be changed.  Continue to rest and increase fluids.  Follow-up with your PCP about your blood pressure and blood sugars.  Go to ED for any significant changes.

## 2020-10-19 ENCOUNTER — Telehealth (HOSPITAL_COMMUNITY): Payer: Self-pay | Admitting: Emergency Medicine

## 2020-10-19 LAB — URINE CULTURE: Culture: >=100000 — AB

## 2020-10-19 MED ORDER — NITROFURANTOIN MONOHYD MACRO 100 MG PO CAPS: 100.0000 mg | ORAL_CAPSULE | Freq: Two times a day (BID) | ORAL | 0 refills | Status: DC

## 2020-10-25 ENCOUNTER — Encounter: Payer: Self-pay | Admitting: Family Medicine

## 2020-10-25 ENCOUNTER — Ambulatory Visit (INDEPENDENT_AMBULATORY_CARE_PROVIDER_SITE_OTHER): Payer: Medicare Other | Admitting: Family Medicine

## 2020-10-25 ENCOUNTER — Other Ambulatory Visit: Payer: Self-pay

## 2020-10-25 VITALS — BP 150/64 | HR 88 | Ht 65.0 in | Wt 181.0 lb

## 2020-10-25 DIAGNOSIS — E538 Deficiency of other specified B group vitamins: Secondary | ICD-10-CM

## 2020-10-25 DIAGNOSIS — R030 Elevated blood-pressure reading, without diagnosis of hypertension: Secondary | ICD-10-CM

## 2020-10-25 DIAGNOSIS — I10 Essential (primary) hypertension: Secondary | ICD-10-CM

## 2020-10-25 DIAGNOSIS — F419 Anxiety disorder, unspecified: Secondary | ICD-10-CM | POA: Diagnosis not present

## 2020-10-25 DIAGNOSIS — R350 Frequency of micturition: Secondary | ICD-10-CM | POA: Diagnosis not present

## 2020-10-25 LAB — POCT URINALYSIS DIPSTICK
Bilirubin, UA: NEGATIVE
Blood, UA: NEGATIVE
Glucose, UA: NEGATIVE
Ketones, UA: NEGATIVE
Leukocytes, UA: NEGATIVE
Nitrite, UA: NEGATIVE
Protein, UA: NEGATIVE
Spec Grav, UA: 1.02 (ref 1.010–1.025)
Urobilinogen, UA: 0.2 E.U./dL
pH, UA: 5 (ref 5.0–8.0)

## 2020-10-25 MED ORDER — CYANOCOBALAMIN 1000 MCG/ML IJ SOLN
1000.0000 ug | Freq: Once | INTRAMUSCULAR | Status: AC
  Administered 2020-10-25: 1000 ug via INTRAMUSCULAR

## 2020-10-25 NOTE — Progress Notes (Signed)
Date:  10/25/2020   Name:  Brenda [name]   DOB:  1931/06/18   MRN:  [id number]   Chief Complaint: Hypertension and b12 deficiency  Hypertension This is a chronic problem. The current episode started more than 1 year ago. The problem has been gradually improving since onset. The problem is controlled. Pertinent negatives include no anxiety, blurred vision, chest pain, headaches, malaise/fatigue, neck pain, orthopnea, palpitations, peripheral edema, PND, shortness of breath or sweats. There are no associated agents to hypertension. There are no known risk factors for coronary artery disease. Past treatments include ACE inhibitors. The current treatment provides moderate improvement. There are no compliance problems.  There is no history of angina, kidney disease, CAD/MI, CVA, heart failure, left ventricular hypertrophy, PVD or retinopathy. There is no history of chronic renal disease, a hypertension causing med or renovascular disease.    Lab Results  Component Value Date   CREATININE 0.96 08/03/2020   BUN 19 08/03/2020   NA 138 08/03/2020   K 5.7 (H) 08/03/2020   CL 100 08/03/2020   CO2 24 08/03/2020   Lab Results  Component Value Date   CHOL 149 08/18/2015   HDL 56.90 08/18/2015   LDLCALC 72 08/18/2015   TRIG 99.0 08/18/2015   CHOLHDL 3 08/18/2015   Lab Results  Component Value Date   TSH 1.63 07/09/2013   Lab Results  Component Value Date   HGBA1C 7.2 09/15/2020   Lab Results  Component Value Date   WBC 6.4 08/03/2020   HGB 12.5 08/03/2020   HCT 35.7 08/03/2020   MCV 100 (H) 08/03/2020   PLT 249 08/03/2020   Lab Results  Component Value Date   ALT 16 10/25/2015   AST 21 10/25/2015   ALKPHOS 72 10/25/2015   BILITOT 0.2 05/07/2018     Review of Systems  Constitutional: Negative.  Negative for chills, fatigue, fever, malaise/fatigue and unexpected weight change.  HENT: Negative for congestion, ear discharge, ear pain, rhinorrhea, sinus pressure, sneezing and  sore throat.   Eyes: Negative for blurred vision, double vision, photophobia, pain, discharge, redness and itching.  Respiratory: Negative for cough, shortness of breath, wheezing and stridor.   Cardiovascular: Negative for chest pain, palpitations, orthopnea and PND.  Gastrointestinal: Negative for abdominal pain, blood in stool, constipation, diarrhea, nausea and vomiting.  Endocrine: Negative for cold intolerance, heat intolerance, polydipsia, polyphagia and polyuria.  Genitourinary: Negative for dysuria, flank pain, frequency, hematuria, menstrual problem, pelvic pain, urgency, vaginal bleeding and vaginal discharge.  Musculoskeletal: Negative for arthralgias, back pain, myalgias and neck pain.  Skin: Negative for rash.  Allergic/Immunologic: Negative for environmental allergies and food allergies.  Neurological: Negative for dizziness, weakness, light-headedness, numbness and headaches.  Hematological: Negative for adenopathy. Does not bruise/bleed easily.  Psychiatric/Behavioral: Negative for dysphoric mood. The patient is not nervous/anxious.     Patient Active Problem List   Diagnosis Date Noted  . Chronic obstructive pulmonary disease ([place]) 05/29/2019  . Hypertension associated with type 2 diabetes mellitus ([place]) 03/09/2019  . Diabetic peripheral neuropathy associated with type 2 diabetes mellitus ([place]) 07/25/2018  . Persistent microalbuminuria associated with type 2 diabetes mellitus ([place]) 07/22/2018  . Screening for breast cancer 08/18/2015  . B12 deficiency 12/02/2014  . Lumbago 07/09/2014  . Shortness of breath 06/30/2014  . Obese 06/30/2014  . Microalbuminuria 06/10/2014  . Right facial pain 05/18/2014  . Anemia 01/01/2014  . Osteoarthritis 07/31/2013  . Other malaise and fatigue 07/09/2013  . Muscle cramp, nocturnal 03/25/2013  .  Increased frequency of urination 02/09/2013  . Urge incontinence 02/09/2013  . Medicare annual wellness visit, subsequent 12/02/2012  .  Eczema 12/02/2012  . Other and unspecified hyperlipidemia 12/02/2012  . Essential hypertension, benign 12/02/2012  . Chronic low back pain 08/28/2012  . Osteoporosis 07/23/2012  . Nocturia 06/26/2012  . Paresthesia of bilateral legs 05/30/2012  . Pernicious anemia 05/30/2012  . Type 2 diabetes mellitus with hyperglycemia, without long-term current use of insulin ([place]) 05/30/2012  . Depression 05/30/2012  . Insomnia 05/30/2012  . Weakness of both legs 05/30/2012    Allergies  Allergen Reactions  . Aspirin Other (See Comments)    bleeding  . Sulfa Antibiotics Swelling  . Oxybutynin Other (See Comments)    Dry mouth    Past Surgical History:  Procedure Laterality Date  . ABDOMINAL HYSTERECTOMY    . FOOT SURGERY  2000  . SHOULDER SURGERY  2013   fracture right shoulder    Social History   Tobacco Use  . Smoking status: Never Smoker  . Smokeless tobacco: Never Used  . Tobacco comment: smoking cessation materials not required  Vaping Use  . Vaping Use: Never used  Substance Use Topics  . Alcohol use: No  . Drug use: No     Medication list has been reviewed and updated.  Current Meds  Medication Sig  . ACCU-CHEK AVIVA PLUS test strip USE 1 STRIP TO CHECK GLUCOSE THREE TIMES DAILY  . Accu-Chek Softclix Lancets lancets 3 (three) times daily. Use as instructed. E11.42  . cetirizine (ZYRTEC) 5 MG tablet Take 1 tablet (5 mg total) by mouth at bedtime.  . cholecalciferol (VITAMIN D) 1000 units tablet Take 4,000 Units by mouth daily.   . cyanocobalamin (,VITAMIN B-12,) 1000 MCG/ML injection Inject 1,000 mcg into the muscle every 30 (thirty) days.  . ferrous sulfate 324 (65 Fe) MG TBEC Take 1 tablet (325 mg total) by mouth daily.  Marland Kitchen glipiZIDE (GLUCOTROL) 10 MG tablet Take 1 tablet by mouth 2 (two) times a day.  . lisinopril (ZESTRIL) 5 MG tablet TAKE 1 TABLET(5 MG) BY MOUTH DAILY  . metFORMIN (GLUCOPHAGE) 1000 MG tablet Take 1 tablet (1,000 mg total) by mouth 2 (two) times  daily with a meal.  . nystatin cream (MYCOSTATIN) Apply 1 application topically 2 (two) times daily.  . pioglitazone (ACTOS) 15 MG tablet Take 1 tablet by mouth daily.    PHQ 2/9 Scores 10/25/2020 07/26/2020 03/09/2020 02/18/2020  PHQ - 2 Score 0 0 1 0  PHQ- 9 Score 0 5 5 0    GAD 7 : Generalized Anxiety Score 10/25/2020 07/26/2020 02/18/2020  Nervous, Anxious, on Edge 0 1 0  Control/stop worrying 0 1 0  Worry too much - different things 0 0 0  Trouble relaxing 0 1 0  Restless 0 0 0  Easily annoyed or irritable 0 0 0  Afraid - awful might happen 0 0 0  Total GAD 7 Score 0 3 0  Anxiety Difficulty - Somewhat difficult -    BP Readings from Last 3 Encounters:  10/25/20 (!) 150/64  10/15/20 (!) 147/78  07/26/20 140/90    Physical Exam Vitals and nursing note reviewed.  Constitutional:      Appearance: She is well-developed and well-nourished.  HENT:     Head: Normocephalic.     Right Ear: Tympanic membrane, ear canal and external ear normal.     Left Ear: Tympanic membrane, ear canal and external ear normal.     Nose: Nose normal.  Mouth/Throat:     Mouth: Oropharynx is clear and moist. Mucous membranes are moist.  Eyes:     General: Lids are everted, no foreign bodies appreciated. No scleral icterus.       Left eye: No foreign body or hordeolum.     Extraocular Movements: EOM normal.     Conjunctiva/sclera: Conjunctivae normal.     Right eye: Right conjunctiva is not injected.     Left eye: Left conjunctiva is not injected.     Pupils: Pupils are equal, round, and reactive to light.  Neck:     Thyroid: No thyromegaly.     Vascular: No JVD.     Trachea: No tracheal deviation.  Cardiovascular:     Rate and Rhythm: Normal rate and regular rhythm.     Pulses: Intact distal pulses.     Heart sounds: Normal heart sounds. No murmur heard. No friction rub. No gallop.   Pulmonary:     Effort: Pulmonary effort is normal. No respiratory distress.     Breath sounds: Normal  breath sounds. No wheezing or rales.  Abdominal:     General: Bowel sounds are normal.     Palpations: Abdomen is soft. There is no hepatosplenomegaly or mass.     Tenderness: There is no abdominal tenderness. There is no guarding or rebound.  Musculoskeletal:        General: No tenderness or edema. Normal range of motion.     Cervical back: Normal range of motion and neck supple.  Lymphadenopathy:     Cervical: No cervical adenopathy.  Skin:    General: Skin is warm.     Findings: No rash.  Neurological:     Mental Status: She is alert and oriented to person, place, and time.     Cranial Nerves: No cranial nerve deficit.     Deep Tendon Reflexes: Strength normal. Reflexes normal.  Psychiatric:        Mood and Affect: Mood and affect normal. Mood is not anxious or depressed.     Wt Readings from Last 3 Encounters:  10/25/20 181 lb (82.1 kg)  10/15/20 182 lb 1.6 oz (82.6 kg)  07/26/20 182 lb (82.6 kg)    BP (!) 150/64   Pulse 88   Ht 5\' 5"  (1.651 m)   Wt 181 lb (82.1 kg)   BMI 30.12 kg/m   Assessment and Plan:  1. Elevated blood pressure, situational Patient is awoken at night with abrupt twitching that she feels is secondary to her blood pressure she immediately takes a reading with her wrist which notes to be elevated but patient has not follow-up following proper for protocol per checking blood pressure.  We spent time going over in preparation and how to take her blood pressure reading and have also encouraged her to get rid of her wrist cuff and to obtain something that she can put on her arm and that she can rest her arm on a table and have a blood pressure reading that is more appropriate.  Patient seems to understand and we will see what we can do about rechecking in the future.  2. Essential hypertension, benign Chronic.  Controlled.  Stable.  Patient will continue lisinopril 5 mg once a day. - POCT urinalysis dipstick  3. Urinary frequency Patient's been having  increased urinary frequency is seen [facility].  Patient's had 2 rounds of antibiotics and we are rechecking the urine today and it is absolutely normal with no signs of  leukocytes red cells or bacteria.  Patient's been informed of the readings and seems to be comforted by the treatment.  4. Anxiety Patient has a history of anxiety and has issues with the [facility] as well as telephone scam to keep calling her.  We do not wish to start her on anything for sedation because she would be at risk of falling she already uses a walking stick whenever she is walking around any house or facility.  5. B12 deficiency Chronic.  Persistent.  Controlled with injections of B12 which she receives today 1000 mcg. - cyanocobalamin ((VITAMIN B-12)) injection 1,000 mcg

## 2020-10-25 NOTE — Patient Instructions (Signed)
How to Take Your Blood Pressure Blood pressure measures how strongly your blood is pressing against the walls of your arteries. Arteries are blood vessels that carry blood from your heart throughout your body. You can take your blood pressure at home with a machine. You may need to check your blood pressure at home:  To check if you have high blood pressure (hypertension).  To check your blood pressure over time.  To make sure your blood pressure medicine is working. Supplies needed:  Blood pressure machine, or monitor.  Dining room chair to sit in.  Table or desk.  Small notebook.  Pencil or pen. How to prepare Avoid these things for 30 minutes before checking your blood pressure:  Having drinks with caffeine in them, such as coffee or tea.  Drinking alcohol.  Eating.  Smoking.  Exercising. Do these things five minutes before checking your blood pressure:  Go to the bathroom and pee (urinate).  Sit in a dining chair. Do not sit in a soft couch or an armchair.  Be quiet. Do not talk. How to take your blood pressure Follow the instructions that came with your machine. If you have a digital blood pressure monitor, these may be the instructions: 1. Sit up straight. 2. Place your feet on the floor. Do not cross your ankles or legs. 3. Rest your left arm at the level of your heart. You may rest it on a table, desk, or chair. 4. Pull up your shirt sleeve. 5. Wrap the blood pressure cuff around the upper part of your left arm. The cuff should be 1 inch (2.5 cm) above your elbow. It is best to wrap the cuff around bare skin. 6. Fit the cuff snugly around your arm. You should be able to place only one finger between the cuff and your arm. 7. Place the cord so that it rests in the bend of your elbow. 8. Press the power button. 9. Sit quietly while the cuff fills with air and loses air. 10. Write down the numbers on the screen. 11. Wait 2-3 minutes and then repeat steps 1-10.    What do the numbers mean? Two numbers make up your blood pressure. The first number is called systolic pressure. The second is called diastolic pressure. An example of a blood pressure reading is "120 over 80" (or 120/80). If you are an adult and do not have a medical condition, use this guide to find out if your blood pressure is normal: Normal  First number: below 120.  Second number: below 80. Elevated  First number: 120-129.  Second number: below 80. Hypertension stage 1  First number: 130-139.  Second number: 80-89. Hypertension stage 2  First number: 140 or above.  Second number: 90 or above. Your blood pressure is above normal even if only the top or bottom number is above normal. Follow these instructions at home:  Check your blood pressure as often as your doctor tells you to.  Check your blood pressure at the same time every day.  Take your monitor to your next doctor's appointment. Your doctor will: ? Make sure you are using it correctly. ? Make sure it is working right.  Make sure you understand what your blood pressure numbers should be.  Tell your doctor if your medicine is causing side effects.  Keep all follow-up visits as told by your doctor. This is important. General tips:  You will need a blood pressure machine, or monitor. Your doctor can suggest a   monitor. You can buy one at a drugstore or online. When choosing one: ? Choose one with an arm cuff. ? Choose one that wraps around your upper arm. Only one finger should fit between your arm and the cuff. ? Do not choose one that measures your blood pressure from your wrist or finger. Where to find more information American Heart Association: www.heart.org Contact a doctor if:  Your blood pressure keeps being high. Get help right away if:  Your first blood pressure number is higher than 180.  Your second blood pressure number is higher than 120. Summary  Check your blood pressure at the same  time every day.  Avoid caffeine, alcohol, smoking, and exercise for 30 minutes before checking your blood pressure.  Make sure you understand what your blood pressure numbers should be. This information is not intended to replace advice given to you by your health care provider. Make sure you discuss any questions you have with your health care provider. Document Revised: 08/21/2019 Document Reviewed: 08/21/2019 Elsevier Patient Education  2021 [employer]  

## 2020-12-09 ENCOUNTER — Other Ambulatory Visit: Payer: Self-pay

## 2020-12-09 ENCOUNTER — Encounter: Payer: Self-pay | Admitting: Family Medicine

## 2020-12-09 ENCOUNTER — Ambulatory Visit (INDEPENDENT_AMBULATORY_CARE_PROVIDER_SITE_OTHER): Payer: Medicare Other | Admitting: Family Medicine

## 2020-12-09 VITALS — BP 130/80 | HR 60 | Ht 65.0 in | Wt 178.0 lb

## 2020-12-09 DIAGNOSIS — R350 Frequency of micturition: Secondary | ICD-10-CM

## 2020-12-09 DIAGNOSIS — H1033 Unspecified acute conjunctivitis, bilateral: Secondary | ICD-10-CM

## 2020-12-09 DIAGNOSIS — E538 Deficiency of other specified B group vitamins: Secondary | ICD-10-CM

## 2020-12-09 LAB — POCT URINALYSIS DIPSTICK
Bilirubin, UA: NEGATIVE
Blood, UA: NEGATIVE
Glucose, UA: NEGATIVE
Ketones, UA: NEGATIVE
Leukocytes, UA: NEGATIVE
Nitrite, UA: NEGATIVE
Protein, UA: NEGATIVE
Spec Grav, UA: 1.01 (ref 1.010–1.025)
Urobilinogen, UA: 0.2 E.U./dL
pH, UA: 6 (ref 5.0–8.0)

## 2020-12-09 MED ORDER — CYANOCOBALAMIN 1000 MCG/ML IJ SOLN
1000.0000 ug | Freq: Once | INTRAMUSCULAR | Status: AC
Start: 2020-12-09 — End: 2020-12-09
  Administered 2020-12-09: 1000 ug via INTRAMUSCULAR

## 2020-12-09 MED ORDER — TOBRAMYCIN 0.3 % OP SOLN
1.0000 [drp] | OPHTHALMIC | 0 refills | Status: DC
Start: 2020-12-09 — End: 2021-06-12

## 2020-12-09 NOTE — Addendum Note (Signed)
Addended by: Juline Patch on: 12/09/2020 03:49 PM   Modules accepted: Level of Service

## 2020-12-09 NOTE — Progress Notes (Addendum)
Date:  12/09/2020   Name:  Brenda [name]   DOB:  11/26/1930   MRN:  [id number]   Chief Complaint: Urinary Frequency, b12 deficiency, and Eye Drainage ("matted up"- L) eye)  Urinary Frequency  This is a new problem. The current episode started in the past 7 days. There has been no fever. Associated symptoms include frequency and urgency. Pertinent negatives include no chills, discharge, flank pain, hematuria, nausea or vomiting. The treatment provided mild relief.  Eye Problem  Both eyes are affected.This is a new problem. The current episode started in the past 7 days. The problem occurs daily. The problem has been gradually worsening. There is known exposure to pink eye. Associated symptoms include an eye discharge, eye redness and itching. Pertinent negatives include no blurred vision, double vision, fever, foreign body sensation, nausea, photophobia, recent URI, vomiting or weakness. She has tried nothing for the symptoms.    Lab Results  Component Value Date   CREATININE 0.96 08/03/2020   BUN 19 08/03/2020   NA 138 08/03/2020   K 5.7 (H) 08/03/2020   CL 100 08/03/2020   CO2 24 08/03/2020   Lab Results  Component Value Date   CHOL 149 08/18/2015   HDL 56.90 08/18/2015   LDLCALC 72 08/18/2015   TRIG 99.0 08/18/2015   CHOLHDL 3 08/18/2015   Lab Results  Component Value Date   TSH 1.63 07/09/2013   Lab Results  Component Value Date   HGBA1C 7.2 09/15/2020   Lab Results  Component Value Date   WBC 6.4 08/03/2020   HGB 12.5 08/03/2020   HCT 35.7 08/03/2020   MCV 100 (H) 08/03/2020   PLT 249 08/03/2020   Lab Results  Component Value Date   ALT 16 10/25/2015   AST 21 10/25/2015   ALKPHOS 72 10/25/2015   BILITOT 0.2 05/07/2018     Review of Systems  Constitutional: Negative.  Negative for chills, fatigue, fever and unexpected weight change.  HENT: Negative for congestion, ear discharge, ear pain, rhinorrhea, sinus pressure, sneezing and sore throat.   Eyes:  Positive for discharge, redness and itching. Negative for blurred vision, double vision, photophobia and pain.  Respiratory: Negative for cough, shortness of breath, wheezing and stridor.   Gastrointestinal: Negative for abdominal pain, blood in stool, constipation, diarrhea, nausea and vomiting.  Endocrine: Negative for cold intolerance, heat intolerance, polydipsia, polyphagia and polyuria.  Genitourinary: Positive for frequency and urgency. Negative for dysuria, flank pain, hematuria, menstrual problem, pelvic pain, vaginal bleeding and vaginal discharge.  Musculoskeletal: Negative for arthralgias, back pain and myalgias.  Skin: Negative for rash.  Allergic/Immunologic: Negative for environmental allergies and food allergies.  Neurological: Negative for dizziness, weakness, light-headedness, numbness and headaches.  Hematological: Negative for adenopathy. Does not bruise/bleed easily.  Psychiatric/Behavioral: Negative for dysphoric mood. The patient is not nervous/anxious.     Patient Active Problem List   Diagnosis Date Noted  . Chronic obstructive pulmonary disease ([place]) 05/29/2019  . Hypertension associated with type 2 diabetes mellitus ([place]) 03/09/2019  . Diabetic peripheral neuropathy associated with type 2 diabetes mellitus ([place]) 07/25/2018  . Persistent microalbuminuria associated with type 2 diabetes mellitus ([place]) 07/22/2018  . Screening for breast cancer 08/18/2015  . B12 deficiency 12/02/2014  . Lumbago 07/09/2014  . Shortness of breath 06/30/2014  . Obese 06/30/2014  . Microalbuminuria 06/10/2014  . Right facial pain 05/18/2014  . Anemia 01/01/2014  . Osteoarthritis 07/31/2013  . Other malaise and fatigue 07/09/2013  . Muscle cramp, nocturnal 03/25/2013  .  Increased frequency of urination 02/09/2013  . Urge incontinence 02/09/2013  . Medicare annual wellness visit, subsequent 12/02/2012  . Eczema 12/02/2012  . Other and unspecified hyperlipidemia 12/02/2012  .  Essential hypertension, benign 12/02/2012  . Chronic low back pain 08/28/2012  . Osteoporosis 07/23/2012  . Nocturia 06/26/2012  . Paresthesia of bilateral legs 05/30/2012  . Pernicious anemia 05/30/2012  . Type 2 diabetes mellitus with hyperglycemia, without long-term current use of insulin ([place]) 05/30/2012  . Depression 05/30/2012  . Insomnia 05/30/2012  . Weakness of both legs 05/30/2012    Allergies  Allergen Reactions  . Aspirin Other (See Comments)    bleeding  . Sulfa Antibiotics Swelling  . Oxybutynin Other (See Comments)    Dry mouth    Past Surgical History:  Procedure Laterality Date  . ABDOMINAL HYSTERECTOMY    . FOOT SURGERY  2000  . SHOULDER SURGERY  2013   fracture right shoulder    Social History   Tobacco Use  . Smoking status: Never Smoker  . Smokeless tobacco: Never Used  . Tobacco comment: smoking cessation materials not required  Vaping Use  . Vaping Use: Never used  Substance Use Topics  . Alcohol use: No  . Drug use: No     Medication list has been reviewed and updated.  Current Meds  Medication Sig  . ACCU-CHEK AVIVA PLUS test strip USE 1 STRIP TO CHECK GLUCOSE THREE TIMES DAILY  . Accu-Chek Softclix Lancets lancets 3 (three) times daily. Use as instructed. E11.42  . cetirizine (ZYRTEC) 5 MG tablet Take 1 tablet (5 mg total) by mouth at bedtime.  . cholecalciferol (VITAMIN D) 1000 units tablet Take 4,000 Units by mouth daily.   . cyanocobalamin (,VITAMIN B-12,) 1000 MCG/ML injection Inject 1,000 mcg into the muscle every 30 (thirty) days.  . ferrous sulfate 324 (65 Fe) MG TBEC Take 1 tablet (325 mg total) by mouth daily.  Marland Kitchen glipiZIDE (GLUCOTROL) 10 MG tablet Take 1 tablet by mouth 2 (two) times a day.  . lisinopril (ZESTRIL) 5 MG tablet TAKE 1 TABLET(5 MG) BY MOUTH DAILY  . metFORMIN (GLUCOPHAGE) 1000 MG tablet Take 1 tablet (1,000 mg total) by mouth 2 (two) times daily with a meal.  . nystatin cream (MYCOSTATIN) Apply 1 application  topically 2 (two) times daily.  . pioglitazone (ACTOS) 15 MG tablet Take 1 tablet by mouth daily.    PHQ 2/9 Scores 12/09/2020 10/25/2020 07/26/2020 03/09/2020  PHQ - 2 Score 0 0 0 1  PHQ- 9 Score 0 0 5 5    GAD 7 : Generalized Anxiety Score 12/09/2020 10/25/2020 07/26/2020 02/18/2020  Nervous, Anxious, on Edge 0 0 1 0  Control/stop worrying 0 0 1 0  Worry too much - different things 0 0 0 0  Trouble relaxing 0 0 1 0  Restless 0 0 0 0  Easily annoyed or irritable 0 0 0 0  Afraid - awful might happen 0 0 0 0  Total GAD 7 Score 0 0 3 0  Anxiety Difficulty - - Somewhat difficult -    BP Readings from Last 3 Encounters:  12/09/20 130/80  10/25/20 (!) 150/64  10/15/20 (!) 147/78    Physical Exam Vitals and nursing note reviewed.  Constitutional:      Appearance: She is well-developed.  HENT:     Head: Normocephalic.     Right Ear: Tympanic membrane, ear canal and external ear normal.     Left Ear: Tympanic membrane, ear canal and external ear  normal.     Nose: Nose normal.     Mouth/Throat:     Mouth: Mucous membranes are moist.  Eyes:     General: Lids are everted, no foreign bodies appreciated. No scleral icterus.       Right eye: Discharge present.        Left eye: Discharge present.    Conjunctiva/sclera:     Right eye: Right conjunctiva is injected.     Left eye: Left conjunctiva is injected.     Pupils: Pupils are equal, round, and reactive to light.  Neck:     Thyroid: No thyromegaly.     Vascular: No JVD.     Trachea: No tracheal deviation.  Cardiovascular:     Rate and Rhythm: Normal rate and regular rhythm.     Heart sounds: Normal heart sounds. No murmur heard. No friction rub. No gallop.   Pulmonary:     Effort: Pulmonary effort is normal. No respiratory distress.     Breath sounds: Normal breath sounds. No wheezing, rhonchi or rales.  Abdominal:     General: Bowel sounds are normal.     Palpations: Abdomen is soft. There is no mass.     Tenderness: There is  no abdominal tenderness. There is no guarding or rebound.  Musculoskeletal:        General: No tenderness. Normal range of motion.     Cervical back: Normal range of motion and neck supple.  Lymphadenopathy:     Cervical: No cervical adenopathy.  Skin:    General: Skin is warm.     Findings: No rash.  Neurological:     Mental Status: She is alert and oriented to person, place, and time.     Cranial Nerves: No cranial nerve deficit.     Deep Tendon Reflexes: Reflexes normal.  Psychiatric:        Mood and Affect: Mood is not anxious or depressed.     Wt Readings from Last 3 Encounters:  12/09/20 178 lb (80.7 kg)  10/25/20 181 lb (82.1 kg)  10/15/20 182 lb 1.6 oz (82.6 kg)    BP 130/80   Pulse 60   Ht 5\' 5"  (1.651 m)   Wt 178 lb (80.7 kg)   BMI 29.62 kg/m   Assessment and Plan: 1. Urinary frequency Chronic.  Controlled.  Stable.  Patient's been having some frequency and urgency thinking that she may having urinary tract infections.  She has the similar symptoms every couple weeks.  Urinalysis was noted to have no leukocytes or red cells to indicate infection.  Patient's been informed that she needs to return to her urologist for overactive bladder treatment. - POCT urinalysis dipstick  2. Acute bacterial conjunctivitis of both eyes New onset.  History, exposure to individual with conjunctivitis, and exam consistent with bilateral bacterial conjunctivitis.  Patient will start on Tobrex ophthalmic solution 1 drop in each eye every 4 hours for the next 5 to 7 days.  While awake - tobramycin (TOBREX) 0.3 % ophthalmic solution; Place 1 drop into both eyes every 4 (four) hours.  Dispense: 5 mL; Refill: 0

## 2020-12-16 DIAGNOSIS — E1129 Type 2 diabetes mellitus with other diabetic kidney complication: Secondary | ICD-10-CM | POA: Diagnosis not present

## 2020-12-16 DIAGNOSIS — I152 Hypertension secondary to endocrine disorders: Secondary | ICD-10-CM | POA: Diagnosis not present

## 2020-12-16 DIAGNOSIS — R809 Proteinuria, unspecified: Secondary | ICD-10-CM | POA: Diagnosis not present

## 2020-12-16 DIAGNOSIS — E1159 Type 2 diabetes mellitus with other circulatory complications: Secondary | ICD-10-CM | POA: Diagnosis not present

## 2020-12-16 DIAGNOSIS — R5383 Other fatigue: Secondary | ICD-10-CM | POA: Diagnosis not present

## 2020-12-16 DIAGNOSIS — E1165 Type 2 diabetes mellitus with hyperglycemia: Secondary | ICD-10-CM | POA: Diagnosis not present

## 2020-12-16 DIAGNOSIS — E669 Obesity, unspecified: Secondary | ICD-10-CM | POA: Diagnosis not present

## 2020-12-16 DIAGNOSIS — E1142 Type 2 diabetes mellitus with diabetic polyneuropathy: Secondary | ICD-10-CM | POA: Diagnosis not present

## 2021-01-25 ENCOUNTER — Encounter: Payer: Self-pay | Admitting: Family Medicine

## 2021-01-25 ENCOUNTER — Ambulatory Visit (INDEPENDENT_AMBULATORY_CARE_PROVIDER_SITE_OTHER): Payer: Medicare Other | Admitting: Family Medicine

## 2021-01-25 VITALS — BP 124/80 | HR 72 | Temp 99.0°F | Ht 65.0 in | Wt 175.0 lb

## 2021-01-25 DIAGNOSIS — J301 Allergic rhinitis due to pollen: Secondary | ICD-10-CM

## 2021-01-25 DIAGNOSIS — E538 Deficiency of other specified B group vitamins: Secondary | ICD-10-CM | POA: Diagnosis not present

## 2021-01-25 DIAGNOSIS — J0100 Acute maxillary sinusitis, unspecified: Secondary | ICD-10-CM | POA: Diagnosis not present

## 2021-01-25 MED ORDER — AZITHROMYCIN 250 MG PO TABS: ORAL_TABLET | ORAL | 0 refills | Status: AC

## 2021-01-25 MED ORDER — MONTELUKAST SODIUM 10 MG PO TABS: 10.0000 mg | ORAL_TABLET | Freq: Every day | ORAL | 3 refills | Status: AC

## 2021-01-25 MED ORDER — CYANOCOBALAMIN 1000 MCG/ML IJ SOLN
1000.0000 ug | Freq: Once | INTRAMUSCULAR | Status: AC
  Administered 2021-01-25: 1000 ug via INTRAMUSCULAR

## 2021-01-25 NOTE — Progress Notes (Signed)
Date:  01/25/2021   Name:  Brenda [name]   DOB:  08-Sep-1931   MRN:  [id number]   Chief Complaint: Sinusitis (Nasal drainage, sore throat, little cough) and b12 deficiency  Sinusitis This is a new problem. The current episode started in the past 7 days. The problem has been gradually improving since onset. There has been no fever. The fever has been present for less than 1 day. The pain is moderate. Associated symptoms include congestion, coughing, sinus pressure, sneezing and a sore throat. Pertinent negatives include no chills, diaphoresis, ear pain, headaches, hoarse voice, neck pain, shortness of breath or swollen glands. Past treatments include nothing. The treatment provided moderate relief.    Lab Results  Component Value Date   CREATININE 0.96 08/03/2020   BUN 19 08/03/2020   NA 138 08/03/2020   K 5.7 (H) 08/03/2020   CL 100 08/03/2020   CO2 24 08/03/2020   Lab Results  Component Value Date   CHOL 149 08/18/2015   HDL 56.90 08/18/2015   LDLCALC 72 08/18/2015   TRIG 99.0 08/18/2015   CHOLHDL 3 08/18/2015   Lab Results  Component Value Date   TSH 1.63 07/09/2013   Lab Results  Component Value Date   HGBA1C 7.2 09/15/2020   Lab Results  Component Value Date   WBC 6.4 08/03/2020   HGB 12.5 08/03/2020   HCT 35.7 08/03/2020   MCV 100 (H) 08/03/2020   PLT 249 08/03/2020   Lab Results  Component Value Date   ALT 16 10/25/2015   AST 21 10/25/2015   ALKPHOS 72 10/25/2015   BILITOT 0.2 05/07/2018     Review of Systems  Constitutional: Negative.  Negative for chills, diaphoresis, fatigue, fever and unexpected weight change.  HENT: Positive for congestion, sinus pressure, sneezing and sore throat. Negative for ear discharge, ear pain, hoarse voice and rhinorrhea.   Eyes: Negative for photophobia, pain, discharge, redness and itching.  Respiratory: Positive for cough. Negative for shortness of breath, wheezing and stridor.   Gastrointestinal: Negative for  abdominal pain, blood in stool, constipation, diarrhea, nausea and vomiting.  Endocrine: Negative for cold intolerance, heat intolerance, polydipsia, polyphagia and polyuria.  Genitourinary: Negative for dysuria, flank pain, frequency, hematuria, menstrual problem, pelvic pain, urgency, vaginal bleeding and vaginal discharge.  Musculoskeletal: Negative for arthralgias, back pain, myalgias and neck pain.  Skin: Negative for rash.  Allergic/Immunologic: Negative for environmental allergies and food allergies.  Neurological: Negative for dizziness, weakness, light-headedness, numbness and headaches.  Hematological: Negative for adenopathy. Does not bruise/bleed easily.  Psychiatric/Behavioral: Negative for dysphoric mood. The patient is not nervous/anxious.     Patient Active Problem List   Diagnosis Date Noted  . Chronic obstructive pulmonary disease ([place]) 05/29/2019  . Hypertension associated with type 2 diabetes mellitus ([place]) 03/09/2019  . Diabetic peripheral neuropathy associated with type 2 diabetes mellitus ([place]) 07/25/2018  . Persistent microalbuminuria associated with type 2 diabetes mellitus ([place]) 07/22/2018  . Screening for breast cancer 08/18/2015  . B12 deficiency 12/02/2014  . Lumbago 07/09/2014  . Shortness of breath 06/30/2014  . Obese 06/30/2014  . Microalbuminuria 06/10/2014  . Right facial pain 05/18/2014  . Anemia 01/01/2014  . Osteoarthritis 07/31/2013  . Other malaise and fatigue 07/09/2013  . Muscle cramp, nocturnal 03/25/2013  . Increased frequency of urination 02/09/2013  . Urge incontinence 02/09/2013  . Medicare annual wellness visit, subsequent 12/02/2012  . Eczema 12/02/2012  . Other and unspecified hyperlipidemia 12/02/2012  . Essential hypertension, benign 12/02/2012  .  Chronic low back pain 08/28/2012  . Osteoporosis 07/23/2012  . Nocturia 06/26/2012  . Paresthesia of bilateral legs 05/30/2012  . Pernicious anemia 05/30/2012  . Type 2 diabetes  mellitus with hyperglycemia, without long-term current use of insulin ([place]) 05/30/2012  . Depression 05/30/2012  . Insomnia 05/30/2012  . Weakness of both legs 05/30/2012    Allergies  Allergen Reactions  . Aspirin Other (See Comments)    bleeding  . Sulfa Antibiotics Swelling  . Oxybutynin Other (See Comments)    Dry mouth    Past Surgical History:  Procedure Laterality Date  . ABDOMINAL HYSTERECTOMY    . FOOT SURGERY  2000  . SHOULDER SURGERY  2013   fracture right shoulder    Social History   Tobacco Use  . Smoking status: Never Smoker  . Smokeless tobacco: Never Used  . Tobacco comment: smoking cessation materials not required  Vaping Use  . Vaping Use: Never used  Substance Use Topics  . Alcohol use: No  . Drug use: No     Medication list has been reviewed and updated.  Current Meds  Medication Sig  . ACCU-CHEK AVIVA PLUS test strip USE 1 STRIP TO CHECK GLUCOSE THREE TIMES DAILY  . Accu-Chek Softclix Lancets lancets 3 (three) times daily. Use as instructed. E11.42  . cetirizine (ZYRTEC) 5 MG tablet Take 1 tablet (5 mg total) by mouth at bedtime.  . cholecalciferol (VITAMIN D) 1000 units tablet Take 4,000 Units by mouth daily.   . cyanocobalamin (,VITAMIN B-12,) 1000 MCG/ML injection Inject 1,000 mcg into the muscle every 30 (thirty) days.  . ferrous sulfate 324 (65 Fe) MG TBEC Take 1 tablet (325 mg total) by mouth daily.  Marland Kitchen glipiZIDE (GLUCOTROL) 10 MG tablet Take 1 tablet by mouth 2 (two) times a day.  . lisinopril (ZESTRIL) 5 MG tablet TAKE 1 TABLET(5 MG) BY MOUTH DAILY  . metFORMIN (GLUCOPHAGE) 1000 MG tablet Take 1 tablet (1,000 mg total) by mouth 2 (two) times daily with a meal.  . nystatin cream (MYCOSTATIN) Apply 1 application topically 2 (two) times daily.  . pioglitazone (ACTOS) 15 MG tablet Take 1 tablet by mouth daily.  Marland Kitchen tobramycin (TOBREX) 0.3 % ophthalmic solution Place 1 drop into both eyes every 4 (four) hours.    PHQ 2/9 Scores 12/09/2020  10/25/2020 07/26/2020 03/09/2020  PHQ - 2 Score 0 0 0 1  PHQ- 9 Score 0 0 5 5    GAD 7 : Generalized Anxiety Score 12/09/2020 10/25/2020 07/26/2020 02/18/2020  Nervous, Anxious, on Edge 0 0 1 0  Control/stop worrying 0 0 1 0  Worry too much - different things 0 0 0 0  Trouble relaxing 0 0 1 0  Restless 0 0 0 0  Easily annoyed or irritable 0 0 0 0  Afraid - awful might happen 0 0 0 0  Total GAD 7 Score 0 0 3 0  Anxiety Difficulty - - Somewhat difficult -    BP Readings from Last 3 Encounters:  01/25/21 124/80  12/09/20 130/80  10/25/20 (!) 150/64    Physical Exam Vitals and nursing note reviewed.  Constitutional:      General: She is not in acute distress.    Appearance: She is not diaphoretic.  HENT:     Head: Normocephalic and atraumatic.     Right Ear: Tympanic membrane, ear canal and external ear normal.     Left Ear: Ear canal and external ear normal.     Nose: Congestion and rhinorrhea  present.     Right Turbinates: Not swollen.     Left Turbinates: Not swollen.     Right Sinus: No maxillary sinus tenderness or frontal sinus tenderness.     Left Sinus: No maxillary sinus tenderness or frontal sinus tenderness.     Mouth/Throat:     Lips: Pink.     Mouth: Mucous membranes are moist.     Dentition: Normal dentition.     Tongue: No lesions.     Palate: No mass.     Pharynx: Oropharynx is clear. Uvula midline. Posterior oropharyngeal erythema present. No pharyngeal swelling or oropharyngeal exudate.     Tonsils: No tonsillar exudate.  Eyes:     General:        Right eye: No discharge.        Left eye: No discharge.     Conjunctiva/sclera: Conjunctivae normal.     Pupils: Pupils are equal, round, and reactive to light.  Neck:     Thyroid: No thyromegaly.     Vascular: No JVD.  Cardiovascular:     Rate and Rhythm: Normal rate and regular rhythm.     Heart sounds: Normal heart sounds. No murmur heard. No friction rub. No gallop.   Pulmonary:     Effort: Pulmonary  effort is normal.     Breath sounds: Normal breath sounds.  Abdominal:     General: Bowel sounds are normal.     Palpations: Abdomen is soft. There is no mass.     Tenderness: There is no abdominal tenderness. There is no guarding.  Musculoskeletal:        General: Normal range of motion.     Cervical back: Normal range of motion and neck supple.  Lymphadenopathy:     Cervical: No cervical adenopathy.  Skin:    General: Skin is warm and dry.  Neurological:     Mental Status: She is alert.     Deep Tendon Reflexes: Reflexes are normal and symmetric.     Wt Readings from Last 3 Encounters:  01/25/21 175 lb (79.4 kg)  12/09/20 178 lb (80.7 kg)  10/25/20 181 lb (82.1 kg)    BP 124/80   Pulse 72   Temp 99 F (37.2 C) (Oral)   Ht 5\' 5"  (1.651 m)   Wt 175 lb (79.4 kg)   SpO2 98%   BMI 29.12 kg/m   Assessment and Plan:  1. Acute maxillary sinusitis, recurrence not specified Acute.  Persistent.  Stable.  History and exam is consistent with upper respiratory infection presumably involvement of the maxillary sinuses.  We will treat with azithromycin 250 mg 2 today followed by 1 a day for 4 days. - azithromycin (ZITHROMAX) 250 MG tablet; Take 2 tablets on day 1, then 1 tablet daily on days 2 through 5  Dispense: 6 tablet; Refill: 0  2. B12 deficiency Patient with history of B12 deficiency is here for her B12 injection.  3. Seasonal allergic rhinitis due to pollen New onset.  Persistent.  Stable.  Patient is taking some over-the-counter preparations we will also add Singulair 10 mg once a day. - montelukast (SINGULAIR) 10 MG tablet; Take 1 tablet (10 mg total) by mouth at bedtime.  Dispense: 30 tablet; Refill: 3

## 2021-01-27 ENCOUNTER — Telehealth: Payer: Medicare Other | Admitting: Family Medicine

## 2021-01-27 ENCOUNTER — Encounter: Payer: Self-pay | Admitting: Emergency Medicine

## 2021-01-27 ENCOUNTER — Telehealth: Payer: Self-pay

## 2021-01-27 ENCOUNTER — Other Ambulatory Visit: Payer: Self-pay

## 2021-01-27 ENCOUNTER — Ambulatory Visit
Admission: EM | Admit: 2021-01-27 | Discharge: 2021-01-27 | Disposition: A | Discharge status: Home / Self Care | Payer: Medicare Other | Attending: Family Medicine | Admitting: Family Medicine

## 2021-01-27 DIAGNOSIS — B9789 Other viral agents as the cause of diseases classified elsewhere: Secondary | ICD-10-CM

## 2021-01-27 DIAGNOSIS — J988 Other specified respiratory disorders: Secondary | ICD-10-CM

## 2021-01-27 DIAGNOSIS — Z7984 Long term (current) use of oral hypoglycemic drugs: Secondary | ICD-10-CM | POA: Insufficient documentation

## 2021-01-27 DIAGNOSIS — Z79899 Other long term (current) drug therapy: Secondary | ICD-10-CM | POA: Diagnosis not present

## 2021-01-27 DIAGNOSIS — U071 COVID-19: Secondary | ICD-10-CM | POA: Diagnosis not present

## 2021-01-27 DIAGNOSIS — J029 Acute pharyngitis, unspecified: Secondary | ICD-10-CM | POA: Diagnosis present

## 2021-01-27 LAB — GROUP A STREP BY PCR: Group A Strep by PCR: NOT DETECTED

## 2021-01-27 LAB — SARS CORONAVIRUS 2 (TAT 6-24 HRS): SARS Coronavirus 2: POSITIVE — AB

## 2021-01-27 MED ORDER — LIDOCAINE VISCOUS HCL 2 % MT SOLN: OROMUCOSAL | 0 refills | Status: DC

## 2021-01-27 NOTE — Telephone Encounter (Signed)
Advised to go to ER or urgent care for further eval

## 2021-01-27 NOTE — Discharge Instructions (Signed)
Continue the antibiotic.  Use the medication for sore throat as directed.  COVID test will be back tomorrow am.  Take care  Dr. Lacinda Axon

## 2021-01-27 NOTE — Telephone Encounter (Signed)
Copied from [place] [id number]. Topic: Appointment Scheduling - Scheduling Inquiry for Clinic >> Jan 27, 2021  7:42 AM Lennox Solders wrote: Reason for CRM:Pt seen dr Ronnald Ramp on Wednesday and now has temp around 100.1 and sore throat is painful and would like to come in office today. Please advise

## 2021-01-27 NOTE — ED Provider Notes (Signed)
[facility]    CSN: [id number] Arrival date & time: 01/27/21  1144      History   Chief Complaint Chief Complaint  Patient presents with  . Sore Throat  . Fever   HPI  87 year old female presents with sore throat.  Symptoms started on 5/17.  Patient reports sore throat.  She is also had runny nose and some cough.  There is no documented fever.  She was seen by her PCP and was placed on azithromycin.  Patient reports that she continues to have symptoms.  She is most bothered by severe sore throat.  She reports difficulty swallowing.  She rates her pain as 9/10 in severity.  No reported sick contacts.  No other complaints.  Past Medical History:  Diagnosis Date  . Arthritis   . Blood in stool   . Cancer ([place])   . Chicken pox   . Diabetes mellitus 2000  . Dry skin   . GERD (gastroesophageal reflux disease)   . Hiatal hernia   . Hypertension   . Kidney infection 2014  . Osteoporosis   . Ovarian cancer ([place]) 1991   s/p hysterectomy at [facility]  . Pernicious anemia   . Spinal stenosis   . Ulcer   . Urinary incontinence     Patient Active Problem List   Diagnosis Date Noted  . Chronic obstructive pulmonary disease ([place]) 05/29/2019  . Hypertension associated with type 2 diabetes mellitus ([place]) 03/09/2019  . Diabetic peripheral neuropathy associated with type 2 diabetes mellitus ([place]) 07/25/2018  . Persistent microalbuminuria associated with type 2 diabetes mellitus ([place]) 07/22/2018  . Screening for breast cancer 08/18/2015  . B12 deficiency 12/02/2014  . Lumbago 07/09/2014  . Shortness of breath 06/30/2014  . Obese 06/30/2014  . Microalbuminuria 06/10/2014  . Right facial pain 05/18/2014  . Anemia 01/01/2014  . Osteoarthritis 07/31/2013  . Other malaise and fatigue 07/09/2013  . Muscle cramp, nocturnal 03/25/2013  . Increased frequency of urination 02/09/2013  . Urge incontinence 02/09/2013  . Medicare annual wellness visit, subsequent 12/02/2012  .  Eczema 12/02/2012  . Other and unspecified hyperlipidemia 12/02/2012  . Essential hypertension, benign 12/02/2012  . Chronic low back pain 08/28/2012  . Osteoporosis 07/23/2012  . Nocturia 06/26/2012  . Paresthesia of bilateral legs 05/30/2012  . Pernicious anemia 05/30/2012  . Type 2 diabetes mellitus with hyperglycemia, without long-term current use of insulin ([place]) 05/30/2012  . Depression 05/30/2012  . Insomnia 05/30/2012  . Weakness of both legs 05/30/2012    Past Surgical History:  Procedure Laterality Date  . ABDOMINAL HYSTERECTOMY    . FOOT SURGERY  2000  . SHOULDER SURGERY  2013   fracture right shoulder    OB History   No obstetric history on file.      Home Medications    Prior to Admission medications   Medication Sig Start Date End Date Taking? Authorizing Provider  lidocaine (XYLOCAINE) 2 % solution Gargle 15 mL every 3 hours as needed for sore throat. May swallow if desired. 01/27/21  Yes Loreta Blouch G, DO  ACCU-CHEK AVIVA PLUS test strip USE 1 STRIP TO CHECK GLUCOSE THREE TIMES DAILY 12/09/18   [provider]  Accu-Chek Softclix Lancets lancets 3 (three) times daily. Use as instructed. E11.42 05/29/17   [provider]  azithromycin (ZITHROMAX) 250 MG tablet Take 2 tablets on day 1, then 1 tablet daily on days 2 through 5 01/25/21 01/30/21  Juline Patch, MD  cetirizine Alethia Berthold)  5 MG tablet Take 1 tablet (5 mg total) by mouth at bedtime. 04/21/20   Coral Spikes, DO  cholecalciferol (VITAMIN D) 1000 units tablet Take 4,000 Units by mouth daily.     [provider]  cyanocobalamin (,VITAMIN B-12,) 1000 MCG/ML injection Inject 1,000 mcg into the muscle every 30 (thirty) days.    [provider]  ferrous sulfate 324 (65 Fe) MG TBEC Take 1 tablet (325 mg total) by mouth daily. 04/11/16   Juline Patch, MD  glipiZIDE (GLUCOTROL) 10 MG tablet Take 1 tablet by mouth 2 (two) times a day. 01/16/19   [provider]  lisinopril  (ZESTRIL) 5 MG tablet TAKE 1 TABLET(5 MG) BY MOUTH DAILY 04/30/20   Juline Patch, MD  metFORMIN (GLUCOPHAGE) 1000 MG tablet Take 1 tablet (1,000 mg total) by mouth 2 (two) times daily with a meal. 04/11/16   Juline Patch, MD  montelukast (SINGULAIR) 10 MG tablet Take 1 tablet (10 mg total) by mouth at bedtime. 01/25/21   Juline Patch, MD  nystatin cream (MYCOSTATIN) Apply 1 application topically 2 (two) times daily. 05/29/19   Juline Patch, MD  pioglitazone (ACTOS) 15 MG tablet Take 1 tablet by mouth daily. 01/24/19   [provider]  tobramycin (TOBREX) 0.3 % ophthalmic solution Place 1 drop into both eyes every 4 (four) hours. 12/09/20   Juline Patch, MD    Family History Family History  Problem Relation Age of Onset  . Arthritis Mother   . Cancer Mother        colon  . Arthritis Father   . Cancer Father        colon  . Cancer Sister        lung  . Cancer Other        lung    Social History Social History   Tobacco Use  . Smoking status: Never Smoker  . Smokeless tobacco: Never Used  . Tobacco comment: smoking cessation materials not required  Vaping Use  . Vaping Use: Never used  Substance Use Topics  . Alcohol use: No  . Drug use: No     Allergies   Aspirin, Sulfa antibiotics, and Oxybutynin   Review of Systems Review of Systems Per HPI  Physical Exam Triage Vital Signs ED Triage Vitals  Enc Vitals Group     BP 01/27/21 1200 (!) 150/101     Pulse Rate 01/27/21 1200 85     Resp 01/27/21 1200 16     Temp 01/27/21 1200 98.4 F (36.9 C)     Temp Source 01/27/21 1200 Oral     SpO2 01/27/21 1200 97 %     Weight --      Height --      Head Circumference --      Peak Flow --      Pain Score 01/27/21 1158 9     Pain Loc --      Pain Edu? --      Excl. in [place]? --    Updated Vital Signs BP (!) 150/101 (BP Location: Left Arm)   Pulse 85   Temp 98.4 F (36.9 C) (Oral)   Resp 16   SpO2 97%   Visual Acuity Right Eye Distance:   Left Eye  Distance:   Bilateral Distance:    Right Eye Near:   Left Eye Near:    Bilateral Near:     Physical Exam Vitals and nursing note reviewed.  Constitutional:  General: She is not in acute distress.    Appearance: She is not ill-appearing.  HENT:     Head: Normocephalic and atraumatic.     Mouth/Throat:     Pharynx: Oropharynx is clear. No oropharyngeal exudate.  Eyes:     General:        Right eye: No discharge.        Left eye: No discharge.     Conjunctiva/sclera: Conjunctivae normal.  Cardiovascular:     Rate and Rhythm: Normal rate and regular rhythm.  Pulmonary:     Effort: Pulmonary effort is normal.     Breath sounds: Normal breath sounds. No wheezing or rales.  Neurological:     Mental Status: She is alert.    UC Treatments / Results  Labs (all labs ordered are listed, but only abnormal results are displayed) Labs Reviewed  GROUP A STREP BY PCR  SARS CORONAVIRUS 2 (TAT 6-24 HRS)    EKG   Radiology No results found.  Procedures Procedures (including critical care time)  Medications Ordered in UC Medications - No data to display  Initial Impression / Assessment and Plan / UC Course  I have reviewed the triage vital signs and the nursing notes.  Pertinent labs & imaging results that were available during my care of the patient were reviewed by me and considered in my medical decision making (see chart for details).    87 year old female presents with clinical picture consistent with a viral respiratory infection.  Strep negative.  Viscous lidocaine as needed.  Awaiting COVID test results.  Final Clinical Impressions(s) / UC Diagnoses   Final diagnoses:  Viral respiratory infection     Discharge Instructions     Continue the antibiotic.  Use the medication for sore throat as directed.  COVID test will be back tomorrow am.  Take care  Dr. Lacinda Axon   ED Prescriptions    Medication Sig Dispense Auth. Provider   lidocaine (XYLOCAINE) 2 %  solution Gargle 15 mL every 3 hours as needed for sore throat. May swallow if desired. 200 mL Coral Spikes, DO     PDMP not reviewed this encounter.   Coral Spikes, [place] 01/27/21 1343

## 2021-01-27 NOTE — ED Triage Notes (Signed)
Pt is present today with a sore throat and a fever. Pt states that her sx started  5/17. Pt states that her sore throat is causing her a lot of discomfort.

## 2021-01-28 ENCOUNTER — Telehealth: Payer: Self-pay | Admitting: Family Medicine

## 2021-01-28 MED ORDER — MOLNUPIRAVIR EUA 200MG CAPSULE: 4.0000 | ORAL_CAPSULE | Freq: Two times a day (BID) | ORAL | 0 refills | Status: AC

## 2021-01-28 NOTE — Telephone Encounter (Signed)
COVID positive. No recent labs. Starting on molnupiravir.  [facility]

## 2021-02-21 ENCOUNTER — Ambulatory Visit
Admission: EM | Admit: 2021-02-21 | Discharge: 2021-02-21 | Disposition: A | Discharge status: Home / Self Care | Payer: Medicare Other | Attending: Sports Medicine | Admitting: Sports Medicine

## 2021-02-21 ENCOUNTER — Other Ambulatory Visit: Payer: Self-pay

## 2021-02-21 DIAGNOSIS — N390 Urinary tract infection, site not specified: Secondary | ICD-10-CM | POA: Insufficient documentation

## 2021-02-21 LAB — URINALYSIS, COMPLETE (UACMP) WITH MICROSCOPIC
Bilirubin Urine: NEGATIVE
Glucose, UA: NEGATIVE mg/dL
Hgb urine dipstick: NEGATIVE
Ketones, ur: NEGATIVE mg/dL
Nitrite: POSITIVE — AB
Protein, ur: NEGATIVE mg/dL
Specific Gravity, Urine: 1.015 (ref 1.005–1.030)
WBC, UA: >50 WBC/hpf (ref 0–5)
pH: 7 (ref 5.0–8.0)

## 2021-02-21 MED ORDER — CEPHALEXIN 500 MG PO CAPS: 500.0000 mg | ORAL_CAPSULE | Freq: Four times a day (QID) | ORAL | 0 refills | Status: DC

## 2021-02-21 NOTE — Discharge Instructions (Addendum)
Take the Keflex twice daily for 5 days with food for treatment of urinary tract infection.  Increase your oral fluid intake so that you increase your urine production and or flushing your urinary system.  Take an over-the-counter probiotic, such as Culturelle-Align-Activia, 1 hour after each dose of antibiotic to prevent diarrhea or yeast infections from forming.  We will culture urine and change the antibiotics if necessary.  Return for reevaluation, or see your primary care provider, for any new or worsening symptoms.

## 2021-02-21 NOTE — ED Provider Notes (Signed)
[facility]    CSN: [id number] Arrival date & time: 02/21/21  1202      History   Chief Complaint Chief Complaint  Patient presents with   Urinary Frequency    HPI Brenda [name] is a 87 y.o. female.   HPI  87 year old female here for evaluation of urinary frequency.  Patient reports that she has been having urinary incontinence for years but last night she just Urinating.  She did not have any pain with urination, no blood in urine, no fever, no low back pain.  She also reports that she has had some itching sensation all over her body that is worse at night.  Past Medical History:  Diagnosis Date   Arthritis    Blood in stool    Cancer ([place])    Chicken pox    Diabetes mellitus 2000   Dry skin    GERD (gastroesophageal reflux disease)    Hiatal hernia    Hypertension    Kidney infection 2014   Osteoporosis    Ovarian cancer ([place]) 1991   s/p hysterectomy at [facility]   Pernicious anemia    Spinal stenosis    Ulcer    Urinary incontinence     Patient Active Problem List   Diagnosis Date Noted   Chronic obstructive pulmonary disease ([place]) 05/29/2019   Hypertension associated with type 2 diabetes mellitus ([place]) 03/09/2019   Diabetic peripheral neuropathy associated with type 2 diabetes mellitus ([place]) 07/25/2018   Persistent microalbuminuria associated with type 2 diabetes mellitus ([place]) 07/22/2018   Screening for breast cancer 08/18/2015   B12 deficiency 12/02/2014   Lumbago 07/09/2014   Shortness of breath 06/30/2014   Obese 06/30/2014   Microalbuminuria 06/10/2014   Right facial pain 05/18/2014   Anemia 01/01/2014   Osteoarthritis 07/31/2013   Other malaise and fatigue 07/09/2013   Muscle cramp, nocturnal 03/25/2013   Increased frequency of urination 02/09/2013   Urge incontinence 02/09/2013   Medicare annual wellness visit, subsequent 12/02/2012   Eczema 12/02/2012   Other and unspecified hyperlipidemia 12/02/2012   Essential hypertension,  benign 12/02/2012   Chronic low back pain 08/28/2012   Osteoporosis 07/23/2012   Nocturia 06/26/2012   Paresthesia of bilateral legs 05/30/2012   Pernicious anemia 05/30/2012   Type 2 diabetes mellitus with hyperglycemia, without long-term current use of insulin ([place]) 05/30/2012   Depression 05/30/2012   Insomnia 05/30/2012   Weakness of both legs 05/30/2012    Past Surgical History:  Procedure Laterality Date   ABDOMINAL HYSTERECTOMY     FOOT SURGERY  2000   SHOULDER SURGERY  2013   fracture right shoulder    OB History   No obstetric history on file.      Home Medications    Prior to Admission medications   Medication Sig Start Date End Date Taking? Authorizing Provider  ACCU-CHEK AVIVA PLUS test strip USE 1 STRIP TO CHECK GLUCOSE THREE TIMES DAILY 12/09/18  Yes [provider]  Accu-Chek Softclix Lancets lancets 3 (three) times daily. Use as instructed. E11.42 05/29/17  Yes [provider]  cephALEXin (KEFLEX) 500 MG capsule Take 1 capsule (500 mg total) by mouth 4 (four) times daily. 02/21/21  Yes Margarette Canada, NP  cetirizine (ZYRTEC) 5 MG tablet Take 1 tablet (5 mg total) by mouth at bedtime. 04/21/20  Yes Cook, Jayce G, DO  cholecalciferol (VITAMIN D) 1000 units tablet Take 4,000 Units by mouth daily.    Yes [provider]  cyanocobalamin (,VITAMIN B-12,) 1000  MCG/ML injection Inject 1,000 mcg into the muscle every 30 (thirty) days.   Yes [provider]  ferrous sulfate 324 (65 Fe) MG TBEC Take 1 tablet (325 mg total) by mouth daily. 04/11/16  Yes Juline Patch, MD  glipiZIDE (GLUCOTROL) 10 MG tablet Take 1 tablet by mouth 2 (two) times a day. 01/16/19  Yes [provider]  lidocaine (XYLOCAINE) 2 % solution Gargle 15 mL every 3 hours as needed for sore throat. May swallow if desired. 01/27/21  Yes Cook, Jayce G, DO  lisinopril (ZESTRIL) 5 MG tablet TAKE 1 TABLET(5 MG) BY MOUTH DAILY 04/30/20  Yes Juline Patch, MD  metFORMIN  (GLUCOPHAGE) 1000 MG tablet Take 1 tablet (1,000 mg total) by mouth 2 (two) times daily with a meal. 04/11/16  Yes Juline Patch, MD  montelukast (SINGULAIR) 10 MG tablet Take 1 tablet (10 mg total) by mouth at bedtime. 01/25/21  Yes Juline Patch, MD  nystatin cream (MYCOSTATIN) Apply 1 application topically 2 (two) times daily. 05/29/19  Yes Juline Patch, MD  pioglitazone (ACTOS) 15 MG tablet Take 1 tablet by mouth daily. 01/24/19  Yes [provider]  tobramycin (TOBREX) 0.3 % ophthalmic solution Place 1 drop into both eyes every 4 (four) hours. 12/09/20  Yes Juline Patch, MD    Family History Family History  Problem Relation Age of Onset   Arthritis Mother    Cancer Mother        colon   Arthritis Father    Cancer Father        colon   Cancer Sister        lung   Cancer Other        lung    Social History Social History   Tobacco Use   Smoking status: Never   Smokeless tobacco: Never   Tobacco comments:    smoking cessation materials not required  Vaping Use   Vaping Use: Never used  Substance Use Topics   Alcohol use: No   Drug use: No     Allergies   Aspirin, Sulfa antibiotics, and Oxybutynin   Review of Systems Review of Systems  Constitutional:  Negative for activity change, appetite change and fever.  Gastrointestinal:  Negative for nausea and vomiting.  Genitourinary:  Positive for frequency. Negative for dysuria, hematuria and urgency.  Musculoskeletal:  Negative for back pain.  Hematological: Negative.   Psychiatric/Behavioral: Negative.      Physical Exam Triage Vital Signs ED Triage Vitals  Enc Vitals Group     BP 02/21/21 1229 (!) 148/99     Pulse Rate 02/21/21 1229 78     Resp 02/21/21 1229 18     Temp 02/21/21 1229 98.2 F (36.8 C)     Temp Source 02/21/21 1229 Oral     SpO2 02/21/21 1229 95 %     Weight 02/21/21 1227 180 lb (81.6 kg)     Height 02/21/21 1227 5\' 5"  (1.651 m)     Head Circumference --      Peak Flow --       Pain Score 02/21/21 1227 0     Pain Loc --      Pain Edu? --      Excl. in [place]? --    No data found.  Updated Vital Signs BP (!) 148/99 (BP Location: Left Arm)   Pulse 78   Temp 98.2 F (36.8 C) (Oral)   Resp 18   Ht 5\' 5"  (1.651  m)   Wt 180 lb (81.6 kg)   SpO2 95%   BMI 29.95 kg/m   Visual Acuity Right Eye Distance:   Left Eye Distance:   Bilateral Distance:    Right Eye Near:   Left Eye Near:    Bilateral Near:     Physical Exam Vitals and nursing note reviewed.  Constitutional:      General: She is not in acute distress.    Appearance: Normal appearance. She is not ill-appearing.  HENT:     Head: Normocephalic and atraumatic.  Cardiovascular:     Rate and Rhythm: Normal rate and regular rhythm.     Pulses: Normal pulses.     Heart sounds: Normal heart sounds. No murmur heard.   No gallop.  Pulmonary:     Effort: Pulmonary effort is normal.     Breath sounds: Normal breath sounds. No wheezing, rhonchi or rales.  Abdominal:     Tenderness: There is no right CVA tenderness or left CVA tenderness.  Skin:    General: Skin is warm and dry.     Capillary Refill: Capillary refill takes less than 2 seconds.  Neurological:     General: No focal deficit present.     Mental Status: She is alert and oriented to person, place, and time.  Psychiatric:        Mood and Affect: Mood normal.        Thought Content: Thought content normal.        Judgment: Judgment normal.     UC Treatments / Results  Labs (all labs ordered are listed, but only abnormal results are displayed) Labs Reviewed  URINALYSIS, COMPLETE (UACMP) WITH MICROSCOPIC - Abnormal; Notable for the following components:      Result Value   APPearance HAZY (*)    Nitrite POSITIVE (*)    Leukocytes,Ua SMALL (*)    Bacteria, UA MANY (*)    All other components within normal limits  URINE CULTURE    EKG   Radiology No results found.  Procedures Procedures (including critical care  time)  Medications Ordered in UC Medications - No data to display  Initial Impression / Assessment and Plan / UC Course  I have reviewed the triage vital signs and the nursing notes.  Pertinent labs & imaging results that were available during my care of the patient were reviewed by me and considered in my medical decision making (see chart for details).  Is a very pleasant, nontoxic-appearing 87 year old female here for evaluation of urinary frequency without urgency or pain as defined in the HPI above.  Patient's physical exam reveals benign cardiopulmonary exam.  Abdomen is soft and nontender.  No CVA tenderness on exam.  UA collected at triage reveals small leukocyte and nitrate positive urinalysis with greater than 50 WBCs many bacteria and WBC clumps.  Will send urine for culture and treat patient with Keflex twice daily for 5 days.  Supportive care.   Final Clinical Impressions(s) / UC Diagnoses   Final diagnoses:  Lower urinary tract infectious disease     Discharge Instructions      Take the Keflex twice daily for 5 days with food for treatment of urinary tract infection.  Increase your oral fluid intake so that you increase your urine production and or flushing your urinary system.  Take an over-the-counter probiotic, such as Culturelle-Align-Activia, 1 hour after each dose of antibiotic to prevent diarrhea or yeast infections from forming.  We will culture urine and change the  antibiotics if necessary.  Return for reevaluation, or see your primary care provider, for any new or worsening symptoms.      ED Prescriptions     Medication Sig Dispense Auth. Provider   cephALEXin (KEFLEX) 500 MG capsule Take 1 capsule (500 mg total) by mouth 4 (four) times daily. 20 capsule Margarette Canada, NP      PDMP not reviewed this encounter.   Margarette Canada, NP 02/21/21 1318

## 2021-02-21 NOTE — ED Triage Notes (Signed)
Patient states that she has been having urinary frequency. States that he is concerned that she has a uti.  Patient states that she has a itching sensation over her body that started a little bit ago. States that this is worse at night.

## 2021-02-24 LAB — URINE CULTURE
Culture: >=100000 — AB
Special Requests: NORMAL

## 2021-03-16 ENCOUNTER — Telehealth: Payer: Self-pay | Admitting: Family Medicine

## 2021-03-16 NOTE — Telephone Encounter (Signed)
Copied from [place] [id number]. Topic: Medicare AWV >> Mar 16, 2021  1:13 PM Cher Nakai R wrote: Reason for CRM:  Left message for patient to call back and schedule Medicare Annual Wellness Visit (AWV) in office.   If unable to come into the office for AWV,  please offer to do virtually or by telephone.  Last AWV: 03/09/2020  Please schedule at anytime with [facility] Health Advisor.  40 minute appointment  Any questions, please contact me at [phone]

## 2021-03-17 DIAGNOSIS — E669 Obesity, unspecified: Secondary | ICD-10-CM | POA: Diagnosis not present

## 2021-03-17 DIAGNOSIS — I152 Hypertension secondary to endocrine disorders: Secondary | ICD-10-CM | POA: Diagnosis not present

## 2021-03-17 DIAGNOSIS — E1142 Type 2 diabetes mellitus with diabetic polyneuropathy: Secondary | ICD-10-CM | POA: Diagnosis not present

## 2021-03-17 DIAGNOSIS — E1159 Type 2 diabetes mellitus with other circulatory complications: Secondary | ICD-10-CM | POA: Diagnosis not present

## 2021-03-17 DIAGNOSIS — R809 Proteinuria, unspecified: Secondary | ICD-10-CM | POA: Diagnosis not present

## 2021-03-17 DIAGNOSIS — E1165 Type 2 diabetes mellitus with hyperglycemia: Secondary | ICD-10-CM | POA: Diagnosis not present

## 2021-03-17 DIAGNOSIS — E1129 Type 2 diabetes mellitus with other diabetic kidney complication: Secondary | ICD-10-CM | POA: Diagnosis not present

## 2021-03-28 ENCOUNTER — Encounter: Payer: Self-pay | Admitting: Family Medicine

## 2021-03-28 ENCOUNTER — Ambulatory Visit (INDEPENDENT_AMBULATORY_CARE_PROVIDER_SITE_OTHER): Payer: Medicare Other | Admitting: Family Medicine

## 2021-03-28 ENCOUNTER — Other Ambulatory Visit: Payer: Self-pay

## 2021-03-28 VITALS — BP 112/60 | HR 68 | Ht 65.0 in | Wt 172.0 lb

## 2021-03-28 DIAGNOSIS — J301 Allergic rhinitis due to pollen: Secondary | ICD-10-CM

## 2021-03-28 DIAGNOSIS — L239 Allergic contact dermatitis, unspecified cause: Secondary | ICD-10-CM

## 2021-03-28 DIAGNOSIS — E538 Deficiency of other specified B group vitamins: Secondary | ICD-10-CM | POA: Diagnosis not present

## 2021-03-28 MED ORDER — HYDROXYZINE HCL 10 MG PO TABS: 10.0000 mg | ORAL_TABLET | Freq: Every evening | ORAL | 0 refills | Status: AC | PRN

## 2021-03-28 MED ORDER — CETIRIZINE HCL 5 MG PO TABS: 5.0000 mg | ORAL_TABLET | Freq: Every day | ORAL | 3 refills | Status: DC

## 2021-03-28 MED ORDER — CYANOCOBALAMIN 1000 MCG/ML IJ SOLN
1000.0000 ug | Freq: Once | INTRAMUSCULAR | Status: AC
  Administered 2021-03-28: 1000 ug via INTRAMUSCULAR

## 2021-03-28 NOTE — Progress Notes (Signed)
Date:  03/28/2021   Name:  Brenda [name]   DOB:  [birth year]   MRN:  [id number]   Chief Complaint: Pruritis (Skin itching all over) and b12 deficiency  Rash This is a chronic problem. The current episode started more than 1 year ago. The problem has been waxing and waning since onset. The rash is diffuse. Pertinent negatives include no anorexia, congestion, cough, diarrhea, eye pain, facial edema, fever or rhinorrhea. Past treatments include nothing. Her past medical history is significant for allergies.   Lab Results  Component Value Date   CREATININE 0.96 08/03/2020   BUN 19 08/03/2020   NA 138 08/03/2020   K 5.7 (H) 08/03/2020   CL 100 08/03/2020   CO2 24 08/03/2020   Lab Results  Component Value Date   CHOL 149 08/18/2015   HDL 56.90 08/18/2015   LDLCALC 72 08/18/2015   TRIG 99.0 08/18/2015   CHOLHDL 3 08/18/2015   Lab Results  Component Value Date   TSH 1.63 07/09/2013   Lab Results  Component Value Date   HGBA1C 7.2 09/15/2020   Lab Results  Component Value Date   WBC 6.4 08/03/2020   HGB 12.5 08/03/2020   HCT 35.7 08/03/2020   MCV 100 (H) 08/03/2020   PLT 249 08/03/2020   Lab Results  Component Value Date   ALT 16 10/25/2015   AST 21 10/25/2015   ALKPHOS 72 10/25/2015   BILITOT 0.2 05/07/2018     Review of Systems  Constitutional:  Negative for fever.  HENT:  Negative for congestion and rhinorrhea.   Eyes:  Negative for pain.  Respiratory:  Negative for cough.   Gastrointestinal:  Negative for anorexia and diarrhea.  Skin:  Positive for rash.   Patient Active Problem List   Diagnosis Date Noted   Chronic obstructive pulmonary disease ([place]) 05/29/2019   Hypertension associated with type 2 diabetes mellitus ([place]) 03/09/2019   Diabetic peripheral neuropathy associated with type 2 diabetes mellitus ([place]) 07/25/2018   Persistent microalbuminuria associated with type 2 diabetes mellitus ([place]) 07/22/2018   Screening for breast cancer 08/18/2015    B12 deficiency 12/02/2014   Lumbago 07/09/2014   Shortness of breath 06/30/2014   Obese 06/30/2014   Microalbuminuria 06/10/2014   Right facial pain 05/18/2014   Anemia 01/01/2014   Osteoarthritis 07/31/2013   Other malaise and fatigue 07/09/2013   Muscle cramp, nocturnal 03/25/2013   Increased frequency of urination 02/09/2013   Urge incontinence 02/09/2013   Medicare annual wellness visit, subsequent 12/02/2012   Eczema 12/02/2012   Other and unspecified hyperlipidemia 12/02/2012   Essential hypertension, benign 12/02/2012   Chronic low back pain 08/28/2012   Osteoporosis 07/23/2012   Nocturia 06/26/2012   Paresthesia of bilateral legs 05/30/2012   Pernicious anemia 05/30/2012   Type 2 diabetes mellitus with hyperglycemia, without long-term current use of insulin ([place]) 05/30/2012   Depression 05/30/2012   Insomnia 05/30/2012   Weakness of both legs 05/30/2012    Allergies  Allergen Reactions   Aspirin Other (See Comments)    bleeding   Sulfa Antibiotics Swelling   Oxybutynin Other (See Comments)    Dry mouth    Past Surgical History:  Procedure Laterality Date   ABDOMINAL HYSTERECTOMY     FOOT SURGERY  2000   SHOULDER SURGERY  2013   fracture right shoulder    Social History   Tobacco Use   Smoking status: Never   Smokeless tobacco: Never   Tobacco comments:    smoking  cessation materials not required  Vaping Use   Vaping Use: Never used  Substance Use Topics   Alcohol use: No   Drug use: No     Medication list has been reviewed and updated.  Current Meds  Medication Sig   ACCU-CHEK AVIVA PLUS test strip USE 1 STRIP TO CHECK GLUCOSE THREE TIMES DAILY   Accu-Chek Softclix Lancets lancets 3 (three) times daily. Use as instructed. E11.42   cholecalciferol (VITAMIN D) 1000 units tablet Take 4,000 Units by mouth daily.    cyanocobalamin (,VITAMIN B-12,) 1000 MCG/ML injection Inject 1,000 mcg into the muscle every 30 (thirty) days.   ferrous sulfate 324  (65 Fe) MG TBEC Take 1 tablet (325 mg total) by mouth daily.   glipiZIDE (GLUCOTROL) 10 MG tablet Take 1 tablet by mouth 2 (two) times a day.   lisinopril (ZESTRIL) 5 MG tablet TAKE 1 TABLET(5 MG) BY MOUTH DAILY   metFORMIN (GLUCOPHAGE) 1000 MG tablet Take 1 tablet (1,000 mg total) by mouth 2 (two) times daily with a meal.   nystatin cream (MYCOSTATIN) Apply 1 application topically 2 (two) times daily.   pioglitazone (ACTOS) 15 MG tablet Take 1 tablet by mouth daily.    PHQ 2/9 Scores 03/28/2021 12/09/2020 10/25/2020 07/26/2020  PHQ - 2 Score 0 0 0 0  PHQ- 9 Score 0 0 0 5    GAD 7 : Generalized Anxiety Score 03/28/2021 12/09/2020 10/25/2020 07/26/2020  Nervous, Anxious, on Edge 0 0 0 1  Control/stop worrying 0 0 0 1  Worry too much - different things 0 0 0 0  Trouble relaxing 0 0 0 1  Restless 0 0 0 0  Easily annoyed or irritable 0 0 0 0  Afraid - awful might happen 0 0 0 0  Total GAD 7 Score 0 0 0 3  Anxiety Difficulty - - - Somewhat difficult    BP Readings from Last 3 Encounters:  03/28/21 112/60  02/21/21 (!) 148/99  01/27/21 (!) 150/101    Physical Exam  Wt Readings from Last 3 Encounters:  03/28/21 172 lb (78 kg)  02/21/21 180 lb (81.6 kg)  01/25/21 175 lb (79.4 kg)    BP 112/60   Pulse 68   Ht 5\' 5"  (1.651 m)   Wt 172 lb (78 kg)   BMI 28.62 kg/m   Assessment and Plan:  1. Allergic dermatitis Chronic.  Episodic.  Stable.  Patient had this about 2 years ago for which she is using some triamcinolone cream but this is not quite doing the job seen that it is generalized more than in 1 particular area.  We will refill her Zyrtec 5 mg 1 in the morning and hydroxyzine 10 mg 1 q. at bedtime. - cetirizine (ZYRTEC) 5 MG tablet; Take 1 tablet (5 mg total) by mouth morning  Dispense: 90 tablet; Refill: 3 - hydrOXYzine (ATARAX/VISTARIL) 10 MG tablet; Take 1 tablet (10 mg total) by mouth at bedtime as needed. For itching  Dispense: 30 tablet; Refill: 0  2. Seasonal allergic  rhinitis due to pollen Patient is able to continue Zyrtec 5 mg daily. - cetirizine (ZYRTEC) 5 MG tablet; Take 1 tablet (5 mg total) by mouth daily.  Dispense: 90 tablet; Refill: 3

## 2021-04-13 ENCOUNTER — Ambulatory Visit (INDEPENDENT_AMBULATORY_CARE_PROVIDER_SITE_OTHER): Payer: Medicare Other | Admitting: Family Medicine

## 2021-04-13 ENCOUNTER — Encounter: Payer: Self-pay | Admitting: Family Medicine

## 2021-04-13 ENCOUNTER — Ambulatory Visit: Payer: Medicare Other | Admitting: Family Medicine

## 2021-04-13 ENCOUNTER — Other Ambulatory Visit: Payer: Self-pay

## 2021-04-13 VITALS — BP 156/66 | HR 80 | Ht 65.0 in | Wt 177.0 lb

## 2021-04-13 DIAGNOSIS — B3731 Acute candidiasis of vulva and vagina: Secondary | ICD-10-CM

## 2021-04-13 DIAGNOSIS — B373 Candidiasis of vulva and vagina: Secondary | ICD-10-CM | POA: Diagnosis not present

## 2021-04-13 DIAGNOSIS — R350 Frequency of micturition: Secondary | ICD-10-CM | POA: Diagnosis not present

## 2021-04-13 LAB — POCT URINALYSIS DIPSTICK
Bilirubin, UA: NEGATIVE
Blood, UA: NEGATIVE
Glucose, UA: NEGATIVE
Ketones, UA: NEGATIVE
Leukocytes, UA: NEGATIVE
Nitrite, UA: NEGATIVE
Protein, UA: NEGATIVE
Spec Grav, UA: 1.025 (ref 1.010–1.025)
Urobilinogen, UA: 0.2 E.U./dL
pH, UA: 5 (ref 5.0–8.0)

## 2021-04-13 MED ORDER — NYSTATIN 100000 UNIT/GM EX CREA: 1.0000 "application " | TOPICAL_CREAM | Freq: Two times a day (BID) | CUTANEOUS | 0 refills | Status: AC

## 2021-04-13 MED ORDER — FLUCONAZOLE 150 MG PO TABS: 150.0000 mg | ORAL_TABLET | Freq: Once | ORAL | 0 refills | Status: AC

## 2021-04-13 NOTE — Progress Notes (Signed)
Date:  04/13/2021   Name:  Brenda [name]   DOB:  1931-06-20   MRN:  [id number]   Chief Complaint: Urinary Tract Infection  Urinary Tract Infection  This is a recurrent problem. The problem has been gradually worsening. The quality of the pain is described as burning. The pain is at a severity of 0/10. The patient is experiencing no pain. There has been no fever. She is Not sexually active. Associated symptoms include frequency. Pertinent negatives include no chills, discharge, flank pain, hematuria, sweats, urgency or vomiting. Associated symptoms comments: dysuria. The treatment provided moderate relief.   Lab Results  Component Value Date   CREATININE 0.96 08/03/2020   BUN 19 08/03/2020   NA 138 08/03/2020   K 5.7 (H) 08/03/2020   CL 100 08/03/2020   CO2 24 08/03/2020   Lab Results  Component Value Date   CHOL 149 08/18/2015   HDL 56.90 08/18/2015   LDLCALC 72 08/18/2015   TRIG 99.0 08/18/2015   CHOLHDL 3 08/18/2015   Lab Results  Component Value Date   TSH 1.63 07/09/2013   Lab Results  Component Value Date   HGBA1C 7.2 09/15/2020   Lab Results  Component Value Date   WBC 6.4 08/03/2020   HGB 12.5 08/03/2020   HCT 35.7 08/03/2020   MCV 100 (H) 08/03/2020   PLT 249 08/03/2020   Lab Results  Component Value Date   ALT 16 10/25/2015   AST 21 10/25/2015   ALKPHOS 72 10/25/2015   BILITOT 0.2 05/07/2018     Review of Systems  Constitutional:  Negative for chills.  Gastrointestinal:  Negative for vomiting.  Genitourinary:  Positive for dysuria and frequency. Negative for flank pain, hematuria and urgency.   Patient Active Problem List   Diagnosis Date Noted   Chronic obstructive pulmonary disease ([place]) 05/29/2019   Hypertension associated with type 2 diabetes mellitus ([place]) 03/09/2019   Diabetic peripheral neuropathy associated with type 2 diabetes mellitus ([place]) 07/25/2018   Persistent microalbuminuria associated with type 2 diabetes mellitus ([place])  07/22/2018   Screening for breast cancer 08/18/2015   B12 deficiency 12/02/2014   Lumbago 07/09/2014   Shortness of breath 06/30/2014   Obese 06/30/2014   Microalbuminuria 06/10/2014   Right facial pain 05/18/2014   Anemia 01/01/2014   Osteoarthritis 07/31/2013   Other malaise and fatigue 07/09/2013   Muscle cramp, nocturnal 03/25/2013   Increased frequency of urination 02/09/2013   Urge incontinence 02/09/2013   Medicare annual wellness visit, subsequent 12/02/2012   Eczema 12/02/2012   Other and unspecified hyperlipidemia 12/02/2012   Essential hypertension, benign 12/02/2012   Chronic low back pain 08/28/2012   Osteoporosis 07/23/2012   Nocturia 06/26/2012   Paresthesia of bilateral legs 05/30/2012   Pernicious anemia 05/30/2012   Type 2 diabetes mellitus with hyperglycemia, without long-term current use of insulin ([place]) 05/30/2012   Depression 05/30/2012   Insomnia 05/30/2012   Weakness of both legs 05/30/2012    Allergies  Allergen Reactions   Aspirin Other (See Comments)    bleeding   Sulfa Antibiotics Swelling   Oxybutynin Other (See Comments)    Dry mouth    Past Surgical History:  Procedure Laterality Date   ABDOMINAL HYSTERECTOMY     FOOT SURGERY  2000   SHOULDER SURGERY  2013   fracture right shoulder    Social History   Tobacco Use   Smoking status: Never   Smokeless tobacco: Never   Tobacco comments:    smoking cessation  materials not required  Vaping Use   Vaping Use: Never used  Substance Use Topics   Alcohol use: No   Drug use: No     Medication list has been reviewed and updated.  Current Meds  Medication Sig   ACCU-CHEK AVIVA PLUS test strip USE 1 STRIP TO CHECK GLUCOSE THREE TIMES DAILY   Accu-Chek Softclix Lancets lancets 3 (three) times daily. Use as instructed. E11.42   cetirizine (ZYRTEC) 5 MG tablet Take 1 tablet (5 mg total) by mouth daily.   cholecalciferol (VITAMIN D) 1000 units tablet Take 4,000 Units by mouth daily.     cyanocobalamin (,VITAMIN B-12,) 1000 MCG/ML injection Inject 1,000 mcg into the muscle every 30 (thirty) days.   ferrous sulfate 324 (65 Fe) MG TBEC Take 1 tablet (325 mg total) by mouth daily.   glipiZIDE (GLUCOTROL) 10 MG tablet Take 1 tablet by mouth 2 (two) times a day.   hydrOXYzine (ATARAX/VISTARIL) 10 MG tablet Take 1 tablet (10 mg total) by mouth at bedtime as needed. For itching   lidocaine (XYLOCAINE) 2 % solution Gargle 15 mL every 3 hours as needed for sore throat. May swallow if desired.   lisinopril (ZESTRIL) 5 MG tablet TAKE 1 TABLET(5 MG) BY MOUTH DAILY   metFORMIN (GLUCOPHAGE) 1000 MG tablet Take 1 tablet (1,000 mg total) by mouth 2 (two) times daily with a meal.   montelukast (SINGULAIR) 10 MG tablet Take 1 tablet (10 mg total) by mouth at bedtime.   nystatin cream (MYCOSTATIN) Apply 1 application topically 2 (two) times daily.   pioglitazone (ACTOS) 15 MG tablet Take 1 tablet by mouth daily.    PHQ 2/9 Scores 03/28/2021 12/09/2020 10/25/2020 07/26/2020  PHQ - 2 Score 0 0 0 0  PHQ- 9 Score 0 0 0 5    GAD 7 : Generalized Anxiety Score 03/28/2021 12/09/2020 10/25/2020 07/26/2020  Nervous, Anxious, on Edge 0 0 0 1  Control/stop worrying 0 0 0 1  Worry too much - different things 0 0 0 0  Trouble relaxing 0 0 0 1  Restless 0 0 0 0  Easily annoyed or irritable 0 0 0 0  Afraid - awful might happen 0 0 0 0  Total GAD 7 Score 0 0 0 3  Anxiety Difficulty - - - Somewhat difficult    BP Readings from Last 3 Encounters:  04/13/21 (!) 156/66  03/28/21 112/60  02/21/21 (!) 148/99    Physical Exam Vitals and nursing note reviewed.  Constitutional:      General: She is not in acute distress.    Appearance: She is not diaphoretic.  HENT:     Head: Normocephalic and atraumatic.     Right Ear: External ear normal.     Left Ear: External ear normal.     Nose: Nose normal.  Eyes:     General:        Right eye: No discharge.        Left eye: No discharge.     Conjunctiva/sclera:  Conjunctivae normal.     Pupils: Pupils are equal, round, and reactive to light.  Neck:     Thyroid: No thyromegaly.     Vascular: No JVD.  Cardiovascular:     Rate and Rhythm: Normal rate and regular rhythm.     Heart sounds: Normal heart sounds. No murmur heard.   No friction rub. No gallop.  Pulmonary:     Effort: Pulmonary effort is normal.     Breath sounds: Normal  breath sounds.  Abdominal:     General: Bowel sounds are normal.     Palpations: Abdomen is soft. There is no mass.     Tenderness: There is no abdominal tenderness. There is no guarding.  Musculoskeletal:        General: Normal range of motion.     Cervical back: Normal range of motion and neck supple.  Lymphadenopathy:     Cervical: No cervical adenopathy.  Skin:    General: Skin is warm and dry.  Neurological:     Mental Status: She is alert.     Deep Tendon Reflexes: Reflexes are normal and symmetric.    Wt Readings from Last 3 Encounters:  04/13/21 177 lb (80.3 kg)  03/28/21 172 lb (78 kg)  02/21/21 180 lb (81.6 kg)    BP (!) 156/66   Pulse 80   Ht '5\' 5"'$  (1.651 m)   Wt 177 lb (80.3 kg)   BMI 29.45 kg/m   Assessment and Plan:  1. Frequent urination New onset.  Episodic.  Patient's been experiencing over the past weeks more of frequency with mild urgency.  Urinalysis is unremarkable for any infection and this is likely due to either candidiasis we will or other urethral irritation.  We will treat with Diflucan 150 mg once a day and some nystatin cream to be used in the periurethral area - POCT urinalysis dipstick - fluconazole (DIFLUCAN) 150 MG tablet; Take 1 tablet (150 mg total) by mouth once for 1 dose.  Dispense: 1 tablet; Refill: 0 - nystatin cream (MYCOSTATIN); Apply 1 application topically 2 (two) times daily.  Dispense: 30 g; Refill: 0  2. Candidiasis of female genitalia As noted above. - fluconazole (DIFLUCAN) 150 MG tablet; Take 1 tablet (150 mg total) by mouth once for 1 dose.   Dispense: 1 tablet; Refill: 0 - nystatin cream (MYCOSTATIN); Apply 1 application topically 2 (two) times daily.  Dispense: 30 g; Refill: 0

## 2021-05-02 ENCOUNTER — Telehealth: Payer: Self-pay

## 2021-05-02 NOTE — Telephone Encounter (Signed)
Placed call to patient to schedule Annual wellness visit. LVM

## 2021-05-11 ENCOUNTER — Encounter: Payer: Self-pay | Admitting: Family Medicine

## 2021-05-11 ENCOUNTER — Other Ambulatory Visit: Payer: Self-pay | Admitting: Family Medicine

## 2021-05-11 ENCOUNTER — Other Ambulatory Visit: Payer: Self-pay

## 2021-05-11 ENCOUNTER — Ambulatory Visit (INDEPENDENT_AMBULATORY_CARE_PROVIDER_SITE_OTHER): Payer: Medicare Other | Admitting: Family Medicine

## 2021-05-11 VITALS — BP 138/80 | HR 64 | Ht 65.0 in | Wt 174.0 lb

## 2021-05-11 DIAGNOSIS — N3281 Overactive bladder: Secondary | ICD-10-CM

## 2021-05-11 DIAGNOSIS — R350 Frequency of micturition: Secondary | ICD-10-CM

## 2021-05-11 DIAGNOSIS — E118 Type 2 diabetes mellitus with unspecified complications: Secondary | ICD-10-CM | POA: Diagnosis not present

## 2021-05-11 DIAGNOSIS — E538 Deficiency of other specified B group vitamins: Secondary | ICD-10-CM

## 2021-05-11 LAB — POCT URINALYSIS DIPSTICK
Bilirubin, UA: NEGATIVE
Blood, UA: NEGATIVE
Glucose, UA: POSITIVE — AB
Ketones, UA: NEGATIVE
Nitrite, UA: NEGATIVE
Protein, UA: NEGATIVE
Spec Grav, UA: 1.025 (ref 1.010–1.025)
Urobilinogen, UA: 0.2 E.U./dL
pH, UA: 6 (ref 5.0–8.0)

## 2021-05-11 MED ORDER — CYANOCOBALAMIN 1000 MCG/ML IJ SOLN
1000.0000 ug | Freq: Once | INTRAMUSCULAR | Status: AC
  Administered 2021-05-11: 1000 ug via INTRAMUSCULAR

## 2021-05-11 NOTE — Progress Notes (Signed)
Date:  05/11/2021   Name:  Brenda [name]   DOB:  [birth year]   MRN:  [id number]   Chief Complaint: Urinary Tract Infection (Frequency, burning) and b12 deficiency  Urinary Tract Infection  This is a new problem. The current episode started in the past 7 days. The problem occurs every urination. The problem has been gradually worsening. The quality of the pain is described as burning. The pain is mild. There has been no fever. She is Not sexually active. Associated symptoms include frequency and urgency. Pertinent negatives include no chills, discharge, flank pain, hematuria, hesitancy, nausea, possible pregnancy, sweats or vomiting. Treatments tried: see urologic evaluation. The treatment provided moderate relief. There is no history of catheterization, kidney stones, recurrent UTIs, a single kidney, urinary stasis or a urological procedure.   Lab Results  Component Value Date   CREATININE 0.96 08/03/2020   BUN 19 08/03/2020   NA 138 08/03/2020   K 5.7 (H) 08/03/2020   CL 100 08/03/2020   CO2 24 08/03/2020   Lab Results  Component Value Date   CHOL 149 08/18/2015   HDL 56.90 08/18/2015   LDLCALC 72 08/18/2015   TRIG 99.0 08/18/2015   CHOLHDL 3 08/18/2015   Lab Results  Component Value Date   TSH 1.63 07/09/2013   Lab Results  Component Value Date   HGBA1C 7.2 09/15/2020   Lab Results  Component Value Date   WBC 6.4 08/03/2020   HGB 12.5 08/03/2020   HCT 35.7 08/03/2020   MCV 100 (H) 08/03/2020   PLT 249 08/03/2020   Lab Results  Component Value Date   ALT 16 10/25/2015   AST 21 10/25/2015   ALKPHOS 72 10/25/2015   BILITOT 0.2 05/07/2018     Review of Systems  Constitutional:  Negative for chills and fever.  HENT:  Negative for drooling, ear discharge, ear pain and sore throat.   Respiratory:  Negative for cough, shortness of breath and wheezing.   Cardiovascular:  Negative for chest pain, palpitations and leg swelling.  Gastrointestinal:  Negative for  abdominal pain, blood in stool, constipation, diarrhea, nausea and vomiting.  Endocrine: Negative for polydipsia.  Genitourinary:  Positive for frequency and urgency. Negative for dysuria, flank pain, hematuria and hesitancy.  Musculoskeletal:  Negative for back pain, myalgias and neck pain.  Skin:  Negative for rash.  Allergic/Immunologic: Negative for environmental allergies.  Neurological:  Negative for dizziness and headaches.  Hematological:  Does not bruise/bleed easily.  Psychiatric/Behavioral:  Negative for suicidal ideas. The patient is not nervous/anxious.    Patient Active Problem List   Diagnosis Date Noted   Chronic obstructive pulmonary disease ([place]) 05/29/2019   Hypertension associated with type 2 diabetes mellitus ([place]) 03/09/2019   Diabetic peripheral neuropathy associated with type 2 diabetes mellitus ([place]) 07/25/2018   Persistent microalbuminuria associated with type 2 diabetes mellitus ([place]) 07/22/2018   Screening for breast cancer 08/18/2015   B12 deficiency 12/02/2014   Lumbago 07/09/2014   Shortness of breath 06/30/2014   Obese 06/30/2014   Microalbuminuria 06/10/2014   Right facial pain 05/18/2014   Anemia 01/01/2014   Osteoarthritis 07/31/2013   Other malaise and fatigue 07/09/2013   Muscle cramp, nocturnal 03/25/2013   Increased frequency of urination 02/09/2013   Urge incontinence 02/09/2013   Medicare annual wellness visit, subsequent 12/02/2012   Eczema 12/02/2012   Other and unspecified hyperlipidemia 12/02/2012   Essential hypertension, benign 12/02/2012   Chronic low back pain 08/28/2012   Osteoporosis 07/23/2012  Nocturia 06/26/2012   Paresthesia of bilateral legs 05/30/2012   Pernicious anemia 05/30/2012   Type 2 diabetes mellitus with hyperglycemia, without long-term current use of insulin ([place]) 05/30/2012   Depression 05/30/2012   Insomnia 05/30/2012   Weakness of both legs 05/30/2012    Allergies  Allergen Reactions   Aspirin Other  (See Comments)    bleeding   Sulfa Antibiotics Swelling   Oxybutynin Other (See Comments)    Dry mouth    Past Surgical History:  Procedure Laterality Date   ABDOMINAL HYSTERECTOMY     FOOT SURGERY  2000   SHOULDER SURGERY  2013   fracture right shoulder    Social History   Tobacco Use   Smoking status: Never   Smokeless tobacco: Never   Tobacco comments:    smoking cessation materials not required  Vaping Use   Vaping Use: Never used  Substance Use Topics   Alcohol use: No   Drug use: No     Medication list has been reviewed and updated.  No outpatient medications have been marked as taking for the 05/11/21 encounter (Office Visit) with Juline Patch, MD.   Current Facility-Administered Medications for the 05/11/21 encounter (Office Visit) with Juline Patch, MD  Medication   cyanocobalamin ((VITAMIN B-12)) injection 1,000 mcg    PHQ 2/9 Scores 03/28/2021 12/09/2020 10/25/2020 07/26/2020  PHQ - 2 Score 0 0 0 0  PHQ- 9 Score 0 0 0 5    GAD 7 : Generalized Anxiety Score 03/28/2021 12/09/2020 10/25/2020 07/26/2020  Nervous, Anxious, on Edge 0 0 0 1  Control/stop worrying 0 0 0 1  Worry too much - different things 0 0 0 0  Trouble relaxing 0 0 0 1  Restless 0 0 0 0  Easily annoyed or irritable 0 0 0 0  Afraid - awful might happen 0 0 0 0  Total GAD 7 Score 0 0 0 3  Anxiety Difficulty - - - Somewhat difficult    BP Readings from Last 3 Encounters:  04/13/21 (!) 156/66  03/28/21 112/60  02/21/21 (!) 148/99    Physical Exam Vitals and nursing note reviewed.  Constitutional:      Appearance: She is well-developed.  HENT:     Head: Normocephalic.     Right Ear: External ear normal.     Left Ear: External ear normal.  Eyes:     General: Lids are everted, no foreign bodies appreciated. No scleral icterus.       Left eye: No foreign body or hordeolum.     Conjunctiva/sclera: Conjunctivae normal.     Right eye: Right conjunctiva is not injected.     Left eye:  Left conjunctiva is not injected.     Pupils: Pupils are equal, round, and reactive to light.  Neck:     Thyroid: No thyromegaly.     Vascular: No JVD.     Trachea: No tracheal deviation.  Cardiovascular:     Rate and Rhythm: Normal rate and regular rhythm.     Heart sounds: Normal heart sounds. No murmur heard.   No friction rub. No gallop.  Pulmonary:     Effort: Pulmonary effort is normal. No respiratory distress.     Breath sounds: Normal breath sounds. No wheezing or rales.  Abdominal:     General: Bowel sounds are normal.     Palpations: Abdomen is soft. There is no mass.     Tenderness: There is no abdominal tenderness. There is no guarding  or rebound.  Musculoskeletal:        General: No tenderness. Normal range of motion.     Cervical back: Normal range of motion and neck supple.  Lymphadenopathy:     Cervical: No cervical adenopathy.  Skin:    General: Skin is warm.     Findings: No rash.  Neurological:     Mental Status: She is alert and oriented to person, place, and time.     Cranial Nerves: No cranial nerve deficit.     Deep Tendon Reflexes: Reflexes normal.  Psychiatric:        Mood and Affect: Mood is not anxious or depressed.    Wt Readings from Last 3 Encounters:  05/11/21 174 lb (78.9 kg)  04/13/21 177 lb (80.3 kg)  03/28/21 172 lb (78 kg)    Ht '5\' 5"'$  (1.651 m)   Wt 174 lb (78.9 kg)   BMI 28.96 kg/m   Assessment and Plan: 1. Frequent urination Recurrent.  Episodic.  Patient's been having some dysuria, pruritus, and urinary frequency throughout the night.  Recheck of urinalysis is unremarkable except for trace white cells.  Is likely a continuance of her overactive bladder that did not respond to previous treatment and needs to return for reevaluation.  In the event that there may be some contribution of atrophic vaginitis I had 2 samples of Premarin cream and have asked her to put a pea-sized portion of it to each labial area on the inside nightly to  see if this may help over the course of her dysuria/introitus irritation. - POCT urinalysis dipstick - Ambulatory referral to Urology  2. B12 deficiency  B12 deficiency is chronic.  Persistent.  Stable.  B12 injection administered today. - cyanocobalamin ((VITAMIN B-12)) injection 1,000 mcg  3. Type 2 diabetes mellitus with complication, without long-term current use of insulin ([facility]) On it.  Uncontrolled.  Stable.  Patient is having polyuria and nocturia which is contributing to the urinary frequency.  Patient is followed [facility]inology at [facility].  Patient has an upcoming appointment in November however we will recheck her hemoglobin A1c to see if it is significantly out of control and whether any steps need to be taken prior to being seen for follow-up. - Hemoglobin A1c  4. Overactive bladder Chronic.  Persistent.  Episodic.  Waxes and wanes.  Patient continues to have issues with overactive bladder and I see that she has been on multiple medications and undergone treatment with needle therapy.  I have encouraged her to return to urology for reevaluation and to see if there is any further means of control that may help her with her nocturia and urinary frequency. - Ambulatory referral to Urology

## 2021-05-12 LAB — HEMOGLOBIN A1C
Est. average glucose Bld gHb Est-mCnc: 197 mg/dL
Hgb A1c MFr Bld: 8.5 % — ABNORMAL HIGH (ref 4.8–5.6)

## 2021-05-16 ENCOUNTER — Telehealth: Payer: Self-pay

## 2021-05-16 NOTE — Telephone Encounter (Signed)
I called pt and endocrinology to get Dr Honor Junes a message concerning pt's A1C being 8.5. Pt stated her "doctor was fired and I don't know what to do." The receptionist is going to get a message to [place] to see if he can squeeze her in soon

## 2021-05-21 NOTE — Progress Notes (Signed)
05/22/2021 3:05 PM   Brenda [name] 11-23-1930 [id number]  Referring provider: Juline Patch, MD [address] [place],  [place] [address]  Urological history: 1. Incontinence -contributing factors of age, diabetes, COPD, depression, pelvic surgery and COPD -failed OAB medications and PTNS -PVR 12 mL  Chief Complaint  Patient presents with   Follow-up    OAB    HPI: Brenda [name] is a 87 y.o. female who presents today for incontinence.    She states she pees all the time.  She is concerned that she might have an infection when her urinary frequency increases.  Patient denies any modifying or aggravating factors.  Patient denies any gross hematuria, dysuria or suprapubic/flank pain.  Patient denies any fevers, chills, nausea or vomiting.    She drinks 4 to 5 bottles a day.    PMH: Past Medical History:  Diagnosis Date   Arthritis    Blood in stool    Cancer ([place])    Chicken pox    Diabetes mellitus 2000   Dry skin    GERD (gastroesophageal reflux disease)    Hiatal hernia    Hypertension    Kidney infection 2014   Osteoporosis    Ovarian cancer ([place]) 1991   s/p hysterectomy at [facility]   Pernicious anemia    Spinal stenosis    Ulcer    Urinary incontinence     Surgical History: Past Surgical History:  Procedure Laterality Date   ABDOMINAL HYSTERECTOMY     FOOT SURGERY  2000   SHOULDER SURGERY  2013   fracture right shoulder    Home Medications:  Allergies as of 05/22/2021       Reactions   Aspirin Other (See Comments)   bleeding   Sulfa Antibiotics Swelling   Oxybutynin Other (See Comments)   Dry mouth        Medication List        Accurate as of May 22, 2021  3:05 PM. If you have any questions, ask your nurse or doctor.          Accu-Chek Aviva Plus test strip Generic drug: glucose blood USE 1 STRIP TO CHECK GLUCOSE THREE TIMES DAILY   Accu-Chek Softclix Lancets lancets 3 (three) times daily. Use as instructed.  E11.42   cetirizine 5 MG tablet Commonly known as: ZYRTEC Take 1 tablet (5 mg total) by mouth daily.   cholecalciferol 1000 units tablet Commonly known as: VITAMIN D Take 4,000 Units by mouth daily.   cyanocobalamin 1000 MCG/ML injection Commonly known as: (VITAMIN B-12) Inject 1,000 mcg into the muscle every 30 (thirty) days.   ferrous sulfate 324 (65 Fe) MG Tbec Take 1 tablet (325 mg total) by mouth daily.   Gemtesa 75 MG Tabs Generic drug: Vibegron Take 75 mg by mouth daily. Started by: Zara Council, PA-C   glipiZIDE 10 MG tablet Commonly known as: GLUCOTROL Take 1 tablet by mouth 2 (two) times a day.   hydrOXYzine 10 MG tablet Commonly known as: ATARAX/VISTARIL Take 1 tablet (10 mg total) by mouth at bedtime as needed. For itching   lidocaine 2 % solution Commonly known as: XYLOCAINE Gargle 15 mL every 3 hours as needed for sore throat. May swallow if desired.   lisinopril 5 MG tablet Commonly known as: ZESTRIL TAKE 1 TABLET(5 MG) BY MOUTH DAILY   metFORMIN 1000 MG tablet Commonly known as: GLUCOPHAGE Take 1 tablet (1,000 mg total) by mouth 2 (two) times daily with a  meal.   montelukast 10 MG tablet Commonly known as: SINGULAIR Take 1 tablet (10 mg total) by mouth at bedtime.   nystatin cream Commonly known as: MYCOSTATIN Apply 1 application topically 2 (two) times daily.   pioglitazone 15 MG tablet Commonly known as: ACTOS Take 1 tablet by mouth daily.   tobramycin 0.3 % ophthalmic solution Commonly known as: TOBREX Place 1 drop into both eyes every 4 (four) hours.        Allergies:  Allergies  Allergen Reactions   Aspirin Other (See Comments)    bleeding   Sulfa Antibiotics Swelling   Oxybutynin Other (See Comments)    Dry mouth    Family History: Family History  Problem Relation Age of Onset   Arthritis Mother    Cancer Mother        colon   Arthritis Father    Cancer Father        colon   Cancer Sister        lung   Cancer  Other        lung    Social History:  reports that she has never smoked. She has never used smokeless tobacco. She reports that she does not drink alcohol and does not use drugs.  ROS: Pertinent ROS in HPI  Physical Exam: BP (!) 174/69   Pulse 86   Ht '5\' 5"'$  (1.651 m)   Wt 175 lb (79.4 kg)   BMI 29.12 kg/m   Constitutional:  Well nourished. Alert and oriented, No acute distress. HEENT: [place] AT, mask in place.  Trachea midline Cardiovascular: No clubbing, cyanosis, or edema. Respiratory: Normal respiratory effort, no increased work of breathing. Neurologic: Grossly intact, no focal deficits, moving all 4 extremities. Psychiatric: Normal mood and affect.    Laboratory Data: Lab Results  Component Value Date   WBC 6.4 08/03/2020   HGB 12.5 08/03/2020   HCT 35.7 08/03/2020   MCV 100 (H) 08/03/2020   PLT 249 08/03/2020    Lab Results  Component Value Date   CREATININE 0.96 08/03/2020    Lab Results  Component Value Date   HGBA1C 8.5 (H) 05/11/2021  I have reviewed the labs.   Urinalysis    Component Value Date/Time   COLORURINE YELLOW 02/21/2021 1225   APPEARANCEUR HAZY (A) 02/21/2021 1225   APPEARANCEUR Clear 05/08/2014 0757   LABSPEC 1.015 02/21/2021 1225   LABSPEC 1.005 05/08/2014 0757   PHURINE 7.0 02/21/2021 1225   GLUCOSEU NEGATIVE 02/21/2021 1225   GLUCOSEU Negative 05/08/2014 0757   HGBUR NEGATIVE 02/21/2021 1225   BILIRUBINUR negative 05/11/2021 1405   BILIRUBINUR Negative 05/08/2014 0757   KETONESUR NEGATIVE 02/21/2021 1225   PROTEINUR Negative 05/11/2021 1405   PROTEINUR NEGATIVE 02/21/2021 1225   UROBILINOGEN 0.2 05/11/2021 1405   NITRITE negative 05/11/2021 1405   NITRITE POSITIVE (A) 02/21/2021 1225   LEUKOCYTESUR Trace (A) 05/11/2021 1405   LEUKOCYTESUR SMALL (A) 02/21/2021 1225   LEUKOCYTESUR Trace 05/08/2014 0757  I have reviewed the labs.   Pertinent Imaging: Results for SUZY, PONTE (MRN [id number]) as of 05/22/2021 14:55  Ref. Range  05/22/2021 14:40  Scan Result Unknown 9m    Assessment & Plan:    1. Urge incontinence -she states she has been dealing with leakage for several years -the cost of the depends is becoming a significant financial burden for her -she has not tried Gemtesa 75 mg, so we will give her some samples to try -she will return in 3 weeks for PVR and  symptoms recheck  2. Increase in frequency -she states that she has been concerned that her increase in frequency at times is a sign of an infection -she will call the office for an UA, UCX and office visit for these events    Return in about 3 weeks (around 06/12/2021) for PVR.  These notes generated with voice recognition software. I apologize for typographical errors.  Zara Council, PA-C  [facility] [address]  [place], [place] [address] [phone]

## 2021-05-22 ENCOUNTER — Other Ambulatory Visit: Payer: Self-pay

## 2021-05-22 ENCOUNTER — Ambulatory Visit (INDEPENDENT_AMBULATORY_CARE_PROVIDER_SITE_OTHER): Payer: Medicare Other | Admitting: Urology

## 2021-05-22 ENCOUNTER — Encounter: Payer: Self-pay | Admitting: Urology

## 2021-05-22 VITALS — BP 174/69 | HR 86 | Ht 65.0 in | Wt 175.0 lb

## 2021-05-22 DIAGNOSIS — N3281 Overactive bladder: Secondary | ICD-10-CM

## 2021-05-22 DIAGNOSIS — N3941 Urge incontinence: Secondary | ICD-10-CM

## 2021-05-22 LAB — BLADDER SCAN AMB NON-IMAGING

## 2021-05-22 MED ORDER — GEMTESA 75 MG PO TABS: 75.0000 mg | ORAL_TABLET | Freq: Every day | ORAL | 0 refills | Status: AC

## 2021-05-30 DIAGNOSIS — I152 Hypertension secondary to endocrine disorders: Secondary | ICD-10-CM | POA: Diagnosis not present

## 2021-05-30 DIAGNOSIS — E1165 Type 2 diabetes mellitus with hyperglycemia: Secondary | ICD-10-CM | POA: Diagnosis not present

## 2021-05-30 DIAGNOSIS — E1142 Type 2 diabetes mellitus with diabetic polyneuropathy: Secondary | ICD-10-CM | POA: Diagnosis not present

## 2021-05-30 DIAGNOSIS — N1831 Chronic kidney disease, stage 3a: Secondary | ICD-10-CM | POA: Diagnosis not present

## 2021-05-30 DIAGNOSIS — E1159 Type 2 diabetes mellitus with other circulatory complications: Secondary | ICD-10-CM | POA: Diagnosis not present

## 2021-05-30 DIAGNOSIS — E1122 Type 2 diabetes mellitus with diabetic chronic kidney disease: Secondary | ICD-10-CM | POA: Diagnosis not present

## 2021-06-05 ENCOUNTER — Ambulatory Visit (INDEPENDENT_AMBULATORY_CARE_PROVIDER_SITE_OTHER): Payer: Medicare Other

## 2021-06-05 DIAGNOSIS — Z0000 Encounter for general adult medical examination without abnormal findings: Secondary | ICD-10-CM

## 2021-06-05 NOTE — Patient Instructions (Signed)
Brenda [name] , Thank you for taking time to come for your Medicare Wellness Visit. I appreciate your ongoing commitment to your health goals. Please review the following plan we discussed and let me know if I can assist you in the future.   Screening recommendations/referrals: Colonoscopy: no longer required Mammogram: no longer required Bone Density: no longer required Recommended yearly ophthalmology/optometry visit for glaucoma screening and checkup Recommended yearly dental visit for hygiene and checkup  Vaccinations: Influenza vaccine: due Pneumococcal vaccine: done 01/01/14 Tdap vaccine: due Shingles vaccine: Shingrix discussed. Please contact your pharmacy for coverage information.  Covid-19:done 10/17/19, 11/07/19 & 05/11/20  Advanced directives: Advance directive discussed with you today. Even though you declined this today please call our office should you change your mind and we can give you the proper paperwork for you to fill out.   Conditions/risks identified: Recommend continuing fall prevention in the home  Next appointment: Follow up in one year for your annual wellness visit    Preventive Care 65 Years and Older, Female Preventive care refers to lifestyle choices and visits with your health care provider that can promote health and wellness. What does preventive care include? A yearly physical exam. This is also called an annual well check. Dental exams once or twice a year. Routine eye exams. Ask your health care provider how often you should have your eyes checked. Personal lifestyle choices, including: Daily care of your teeth and gums. Regular physical activity. Eating a healthy diet. Avoiding tobacco and drug use. Limiting alcohol use. Practicing safe sex. Taking low-dose aspirin every day. Taking vitamin and mineral supplements as recommended by your health care provider. What happens during an annual well check? The services and screenings done by your health  care provider during your annual well check will depend on your age, overall health, lifestyle risk factors, and family history of disease. Counseling  Your health care provider may ask you questions about your: Alcohol use. Tobacco use. Drug use. Emotional well-being. Home and relationship well-being. Sexual activity. Eating habits. History of falls. Memory and ability to understand (cognition). Work and work Statistician. Reproductive health. Screening  You may have the following tests or measurements: Height, weight, and BMI. Blood pressure. Lipid and cholesterol levels. These may be checked every 5 years, or more frequently if you are over 16 years old. Skin check. Lung cancer screening. You may have this screening every year starting at age 30 if you have a 30-pack-year history of smoking and currently smoke or have quit within the past 15 years. Fecal occult blood test (FOBT) of the stool. You may have this test every year starting at age 70. Flexible sigmoidoscopy or colonoscopy. You may have a sigmoidoscopy every 5 years or a colonoscopy every 10 years starting at age 4. Hepatitis C blood test. Hepatitis B blood test. Sexually transmitted disease (STD) testing. Diabetes screening. This is done by checking your blood sugar (glucose) after you have not eaten for a while (fasting). You may have this done every 1-3 years. Bone density scan. This is done to screen for osteoporosis. You may have this done starting at age 14. Mammogram. This may be done every 1-2 years. Talk to your health care provider about how often you should have regular mammograms. Talk with your health care provider about your test results, treatment options, and if necessary, the need for more tests. Vaccines  Your health care provider may recommend certain vaccines, such as: Influenza vaccine. This is recommended every year. Tetanus, diphtheria, and  acellular pertussis (Tdap, Td) vaccine. You may need a Td  booster every 10 years. Zoster vaccine. You may need this after age 12. Pneumococcal 13-valent conjugate (PCV13) vaccine. One dose is recommended after age 19. Pneumococcal polysaccharide (PPSV23) vaccine. One dose is recommended after age 3. Talk to your health care provider about which screenings and vaccines you need and how often you need them. This information is not intended to replace advice given to you by your health care provider. Make sure you discuss any questions you have with your health care provider. Document Released: 09/23/2015 Document Revised: 05/16/2016 Document Reviewed: 06/28/2015 Elsevier Interactive Patient Education  2017 [place] Prevention in the Home Falls can cause injuries. They can happen to people of all ages. There are many things you can do to make your home safe and to help prevent falls. What can I do on the outside of my home? Regularly fix the edges of walkways and driveways and fix any cracks. Remove anything that might make you trip as you walk through a door, such as a raised step or threshold. Trim any bushes or trees on the path to your home. Use bright outdoor lighting. Clear any walking paths of anything that might make someone trip, such as rocks or tools. Regularly check to see if handrails are loose or broken. Make sure that both sides of any steps have handrails. Any raised decks and porches should have guardrails on the edges. Have any leaves, snow, or ice cleared regularly. Use sand or salt on walking paths during winter. Clean up any spills in your garage right away. This includes oil or grease spills. What can I do in the bathroom? Use night lights. Install grab bars by the toilet and in the tub and shower. Do not use towel bars as grab bars. Use non-skid mats or decals in the tub or shower. If you need to sit down in the shower, use a plastic, non-slip stool. Keep the floor dry. Clean up any water that spills on the floor  as soon as it happens. Remove soap buildup in the tub or shower regularly. Attach bath mats securely with double-sided non-slip rug tape. Do not have throw rugs and other things on the floor that can make you trip. What can I do in the bedroom? Use night lights. Make sure that you have a light by your bed that is easy to reach. Do not use any sheets or blankets that are too big for your bed. They should not hang down onto the floor. Have a firm chair that has side arms. You can use this for support while you get dressed. Do not have throw rugs and other things on the floor that can make you trip. What can I do in the kitchen? Clean up any spills right away. Avoid walking on wet floors. Keep items that you use a lot in easy-to-reach places. If you need to reach something above you, use a strong step stool that has a grab bar. Keep electrical cords out of the way. Do not use floor polish or wax that makes floors slippery. If you must use wax, use non-skid floor wax. Do not have throw rugs and other things on the floor that can make you trip. What can I do with my stairs? Do not leave any items on the stairs. Make sure that there are handrails on both sides of the stairs and use them. Fix handrails that are broken or loose. Make sure that  handrails are as long as the stairways. Check any carpeting to make sure that it is firmly attached to the stairs. Fix any carpet that is loose or worn. Avoid having throw rugs at the top or bottom of the stairs. If you do have throw rugs, attach them to the floor with carpet tape. Make sure that you have a light switch at the top of the stairs and the bottom of the stairs. If you do not have them, ask someone to add them for you. What else can I do to help prevent falls? Wear shoes that: Do not have high heels. Have rubber bottoms. Are comfortable and fit you well. Are closed at the toe. Do not wear sandals. If you use a stepladder: Make sure that it is  fully opened. Do not climb a closed stepladder. Make sure that both sides of the stepladder are locked into place. Ask someone to hold it for you, if possible. Clearly mark and make sure that you can see: Any grab bars or handrails. First and last steps. Where the edge of each step is. Use tools that help you move around (mobility aids) if they are needed. These include: Canes. Walkers. Scooters. Crutches. Turn on the lights when you go into a dark area. Replace any light bulbs as soon as they burn out. Set up your furniture so you have a clear path. Avoid moving your furniture around. If any of your floors are uneven, fix them. If there are any pets around you, be aware of where they are. Review your medicines with your doctor. Some medicines can make you feel dizzy. This can increase your chance of falling. Ask your doctor what other things that you can do to help prevent falls. This information is not intended to replace advice given to you by your health care provider. Make sure you discuss any questions you have with your health care provider. Document Released: 06/23/2009 Document Revised: 02/02/2016 Document Reviewed: 10/01/2014 Elsevier Interactive Patient Education  2017 [employer].

## 2021-06-05 NOTE — Progress Notes (Signed)
Subjective:   Brenda [name] is a 87 y.o. female who presents for Medicare Annual (Subsequent) preventive examination.  Virtual Visit via Telephone Note  I connected with  Brenda [name] on 06/05/21 at  2:40 PM EDT by telephone and verified that I am speaking with the correct person using two identifiers.  Location: Patient: home Provider: [facility] Persons participating in the virtual visit: Brenda [name]   I discussed the limitations, risks, security and privacy concerns of performing an evaluation and management service by telephone and the availability of in person appointments. The patient expressed understanding and agreed to proceed.  Interactive audio and video telecommunications were attempted between this nurse and patient, however failed, due to patient having technical difficulties OR patient did not have access to video capability.  We continued and completed visit with audio only.  Some vital signs may be absent or patient reported.   Brenda Marker, LPN   Review of Systems     Cardiac Risk Factors include: advanced age (>48men, >81 women);diabetes mellitus;hypertension;obesity (BMI >30kg/m2);sedentary lifestyle     Objective:    There were no vitals filed for this visit. There is no height or weight on file to calculate BMI.  Advanced Directives 06/05/2021 02/21/2021 03/09/2020 01/31/2020 03/11/2019 03/09/2019 07/16/2018  Does Patient Have a Medical Advance Directive? No No Yes No No Yes No  Type of Advance Directive - Public librarian;Living will - - [place];Living will -  Copy of [place] in Chart? - - No - copy requested - - No - copy requested -  Would patient like information on creating a medical advance directive? No - Patient declined - - - - - -    Current Medications (verified) Outpatient Encounter Medications as of 06/05/2021  Medication Sig   ACCU-CHEK AVIVA PLUS test strip USE 1 STRIP TO CHECK  GLUCOSE THREE TIMES DAILY   Accu-Chek Softclix Lancets lancets 3 (three) times daily. Use as instructed. E11.42   cholecalciferol (VITAMIN D) 1000 units tablet Take 2,000 Units by mouth daily.   cyanocobalamin (,VITAMIN B-12,) 1000 MCG/ML injection Inject 1,000 mcg into the muscle every 30 (thirty) days.   ferrous sulfate 324 (65 Fe) MG TBEC Take 1 tablet (325 mg total) by mouth daily.   glipiZIDE (GLUCOTROL) 10 MG tablet Take 1 tablet by mouth 2 (two) times a day.   hydrOXYzine (ATARAX/VISTARIL) 10 MG tablet Take 1 tablet (10 mg total) by mouth at bedtime as needed. For itching   lisinopril (ZESTRIL) 5 MG tablet TAKE 1 TABLET(5 MG) BY MOUTH DAILY   metFORMIN (GLUCOPHAGE) 1000 MG tablet Take 1 tablet (1,000 mg total) by mouth 2 (two) times daily with a meal.   montelukast (SINGULAIR) 10 MG tablet Take 1 tablet (10 mg total) by mouth at bedtime.   nystatin cream (MYCOSTATIN) Apply 1 application topically 2 (two) times daily.   pioglitazone (ACTOS) 15 MG tablet Take 1 tablet by mouth daily.   Vibegron (GEMTESA) 75 MG TABS Take 75 mg by mouth daily.   [DISCONTINUED] lidocaine (XYLOCAINE) 2 % solution Gargle 15 mL every 3 hours as needed for sore throat. May swallow if desired.   cetirizine (ZYRTEC) 5 MG tablet Take 1 tablet (5 mg total) by mouth daily. (Patient not taking: Reported on 06/05/2021)   tobramycin (TOBREX) 0.3 % ophthalmic solution Place 1 drop into both eyes every 4 (four) hours. (Patient not taking: Reported on 06/05/2021)   No facility-administered encounter medications on file  as of 06/05/2021.    Allergies (verified) Aspirin, Sulfa antibiotics, and Oxybutynin   History: Past Medical History:  Diagnosis Date   Arthritis    Blood in stool    Cancer ([place])    Chicken pox    Diabetes mellitus 2000   Dry skin    GERD (gastroesophageal reflux disease)    Hiatal hernia    Hypertension    Kidney infection 2014   Osteoporosis    Ovarian cancer ([place]) 1991   s/p hysterectomy at  [facility]   Pernicious anemia    Spinal stenosis    Ulcer    Urinary incontinence    Past Surgical History:  Procedure Laterality Date   ABDOMINAL HYSTERECTOMY     FOOT SURGERY  2000   SHOULDER SURGERY  2013   fracture right shoulder   Family History  Problem Relation Age of Onset   Arthritis Mother    Cancer Mother        colon   Arthritis Father    Cancer Father        colon   Cancer Sister        lung   Cancer Other        lung   Social History   Socioeconomic History   Marital status: Widowed    Spouse name: Not on file   Number of children: 2   Years of education: Not on file   Highest education level: 9th grade  Occupational History   Occupation: Retired  Tobacco Use   Smoking status: Never   Smokeless tobacco: Never   Tobacco comments:    smoking cessation materials not required  Vaping Use   Vaping Use: Never used  Substance and Sexual Activity   Alcohol use: No   Drug use: No   Sexual activity: Not Currently    Birth control/protection: Post-menopausal  Other Topics Concern   Not on file  Social History Narrative   Lives alone in [place]. 1 daughter living in [place]. Son died lung cancer.   Social Determinants of Health   Financial Resource Strain: Low Risk    Difficulty of Paying Living Expenses: Not hard at all  Food Insecurity: No Food Insecurity   Worried About Charity fundraiser in the Last Year: Never true   [place] in the Last Year: Never true  Transportation Needs: No Transportation Needs   Lack of Transportation (Medical): No   Lack of Transportation (Non-Medical): No  Physical Activity: Inactive   Days of Exercise per Week: 0 days   Minutes of Exercise per Session: 0 min  Stress: No Stress Concern Present   Feeling of Stress : Only a little  Social Connections: Moderately Isolated   Frequency of Communication with Friends and Family: More than three times a week   Frequency of Social Gatherings with Friends and Family: Three  times a week   Attends Religious Services: More than 4 times per year   Active Member of Clubs or Organizations: No   Attends Archivist Meetings: Never   Marital Status: Widowed    Tobacco Counseling Counseling given: Not Answered Tobacco comments: smoking cessation materials not required   Clinical Intake:  Pre-visit preparation completed: Yes  Pain : No/denies pain (c/o back pain while walking and feels like it "gives out")     Nutritional Risks: None Diabetes: Yes CBG done?: No Did pt. bring in CBG monitor from home?: No  How often do you need to have someone  help you when you read instructions, pamphlets, or other written materials from your doctor or pharmacy?: 1 - Never  Nutrition Risk Assessment:  Has the patient had any N/V/D within the last 2 months?  No  Does the patient have any non-healing wounds?  No  Has the patient had any unintentional weight loss or weight gain?  No   Diabetes:  Is the patient diabetic?  Yes  If diabetic, was a CBG obtained today?  No  Did the patient bring in their glucometer from home?  No  How often do you monitor your CBG's? Twice daily.   Financial Strains and Diabetes Management:  Are you having any financial strains with the device, your supplies or your medication? No .  Does the patient want to be seen by Chronic Care Management for management of their diabetes?  No  Would the patient like to be referred to a Nutritionist or for Diabetic Management?  No   Diabetic Exams:  Diabetic Eye Exam: Completed 06/13/20 negative retinopathy.   Diabetic Foot Exam: Completed 06/10/20.   Interpreter Needed?: No  Information entered by :: Brenda Marker LPN   Activities of Daily Living In your present state of health, do you have any difficulty performing the following activities: 06/05/2021 03/28/2021  Hearing? Tempie Donning  Vision? N Y  Difficulty concentrating or making decisions? N N  Walking or climbing stairs? Y Y  Dressing  or bathing? N N  Doing errands, shopping? N N  Preparing Food and eating ? N -  Using the Toilet? N -  In the past six months, have you accidently leaked urine? Y -  Comment wears pads/depends for protection -  Do you have problems with loss of bowel control? N -  Managing your Medications? N -  Managing your Finances? N -  Housekeeping or managing your Housekeeping? N -  Some recent data might be hidden    Patient Care Team: Juline Patch, MD as PCP - General (Family Medicine) Leandrew Koyanagi, MD as Consulting Physician (Ophthalmology) Gabriel Carina Betsey Holiday, MD as Physician Assistant (Endocrinology) Nori Riis, PA-C as Physician Assistant (Urology)  Indicate any recent Medical Services you may have received from other than [facility] providers in the past year (date may be approximate).     Assessment:   This is a routine wellness examination for Pamila.  Hearing/Vision screen Hearing Screening - Comments:: Pt c/o worsening difficulty with hearing, has hearing aids but does not wear them Vision Screening - Comments:: [facility] screen[facility] Dr. Wallace Going  Dietary issues and exercise activities discussed: Current Exercise Habits: The patient does not participate in regular exercise at present, Exercise limited by: orthopedic condition(s)   Goals Addressed             This Visit's Progress    DIET - INCREASE WATER INTAKE   On track    Recommend to drink at least 6-8 8oz glasses of water per day.       Depression Screen PHQ 2/9 Scores 06/05/2021 03/28/2021 12/09/2020 10/25/2020 07/26/2020 03/09/2020 02/18/2020  PHQ - 2 Score 1 0 0 0 0 1 0  PHQ- 9 Score - 0 0 0 5 5 0    Fall Risk Fall Risk  06/05/2021 03/28/2021 10/25/2020 07/26/2020 03/09/2020  Falls in the past year? 0 0 0 0 0  Number falls in past yr: 0 0 - - 0  Injury with Fall? 0 0 - - 0  Risk Factor Category  - - - - -  Risk for fall due to : Impaired balance/gait;Impaired mobility No Fall  Risks - - Impaired balance/gait;Impaired mobility;Impaired vision;Orthopedic patient  Risk for fall due to: Comment - - - - -  Follow up Falls prevention discussed Falls evaluation completed Falls evaluation completed Falls evaluation completed Falls prevention discussed    FALL RISK PREVENTION PERTAINING TO THE HOME:  Any stairs in or around the home? Yes  If so, are there any without handrails? No  Home free of loose throw rugs in walkways, pet beds, electrical cords, etc? Yes  Adequate lighting in your home to reduce risk of falls? Yes   ASSISTIVE DEVICES UTILIZED TO PREVENT FALLS:  Life alert? Yes   - lost necklace and plans to contact company Use of a cane, walker or w/c? Yes  Grab bars in the bathroom? Yes  Shower chair or bench in shower? Yes  Elevated toilet seat or a handicapped toilet? No   TIMED UP AND GO:  Was the test performed? No . Virtual visit.   Cognitive Function: Normal cognitive status assessed by direct observation by this Nurse Health Advisor. No abnormalities found.       6CIT Screen 03/09/2020 03/09/2019 02/19/2018 02/18/2017  What Year? 0 points 0 points 0 points 0 points  What month? 0 points 0 points 0 points 0 points  What time? 0 points 0 points 0 points 0 points  Count back from 20 0 points 0 points 0 points 0 points  Months in reverse 0 points 2 points 0 points 0 points  Repeat phrase 2 points 4 points 0 points 0 points  Total Score 2 6 0 0    Immunizations Immunization History  Administered Date(s) Administered   Fluad Quad(high Dose 65+) 06/30/2019   Influenza Split 06/18/2012, 06/18/2015, 06/15/2016   Influenza, High Dose Seasonal PF 06/24/2017   Influenza,inj,Quad PF,6+ Mos 06/25/2013, 07/09/2014   Influenza-Unspecified 09/10/2018, 06/10/2020   PFIZER Comirnaty(Gray Top)Covid-19 Tri-Sucrose Vaccine 11/07/2019   PFIZER(Purple Top)SARS-COV-2 Vaccination 10/17/2019, 05/11/2020   Pneumococcal Conjugate-13 01/01/2014    Pneumococcal-Unspecified 08/29/2011    TDAP status: Due, Education has been provided regarding the importance of this vaccine. Advised may receive this vaccine at local pharmacy or Health Dept. Aware to provide a copy of the vaccination record if obtained from local pharmacy or Health Dept. Verbalized acceptance and understanding.  Flu Vaccine status: Declined, Education has been provided regarding the importance of this vaccine but patient still declined. Advised may receive this vaccine at local pharmacy or Health Dept. Aware to provide a copy of the vaccination record if obtained from local pharmacy or Health Dept. Verbalized acceptance and understanding.  Pneumococcal vaccine status: Up to date  Covid-19 vaccine status: Completed vaccines  Qualifies for Shingles Vaccine? Yes   Zostavax completed No   Shingrix Completed?: No.    Education has been provided regarding the importance of this vaccine. Patient has been advised to call insurance company to determine out of pocket expense if they have not yet received this vaccine. Advised may also receive vaccine at local pharmacy or Health Dept. Verbalized acceptance and understanding.  Screening Tests Health Maintenance  Topic Date Due   COVID-19 Vaccine (4 - Booster for Pfizer series) 08/03/2020   INFLUENZA VACCINE  04/10/2021   Zoster Vaccines- Shingrix (1 of 2) 07/14/2021 (Originally 05/24/1950)   TETANUS/TDAP  07/26/2021 (Originally 07/23/2017)   FOOT EXAM  06/10/2021   OPHTHALMOLOGY EXAM  06/13/2021   HEMOGLOBIN A1C  11/08/2021   DEXA SCAN  Completed   HPV VACCINES  Aged Out    Health Maintenance  Health Maintenance Due  Topic Date Due   COVID-19 Vaccine (4 - Booster for Pfizer series) 08/03/2020   INFLUENZA VACCINE  04/10/2021    Colorectal cancer screening: No longer required.   Mammogram status: No longer required due to age.  Bone density status: no longer   Lung Cancer Screening: (Low Dose CT Chest recommended if  Age 16-80 years, 30 pack-year currently smoking OR have quit w/in 15years.) does not qualify.    Additional Screening:  Hepatitis C Screening: does not qualify  Vision Screening: Recommended annual ophthalmology exams for early detection of glaucoma and other disorders of the eye. Is the patient up to date with their annual eye exam?  Yes  Who is the provider or what is the name of the office in which the patient attends annual eye exams? [facility].   Dental Screening: Recommended annual dental exams for proper oral hygiene  Community Resource Referral / Chronic Care Management: CRR required this visit?  No   CCM required this visit?  No      Plan:     I have personally reviewed and noted the following in the patient's chart:   Medical and social history Use of alcohol, tobacco or illicit drugs  Current medications and supplements including opioid prescriptions.  Functional ability and status Nutritional status Physical activity Advanced directives List of other physicians Hospitalizations, surgeries, and ER visits in previous 12 months Vitals Screenings to include cognitive, depression, and falls Referrals and appointments  In addition, I have reviewed and discussed with patient certain preventive protocols, quality metrics, and best practice recommendations. A written personalized care plan for preventive services as well as general preventive health recommendations were provided to patient.     Brenda Marker, LPN   2/84/1324   Nurse Notes: none

## 2021-06-11 NOTE — Progress Notes (Signed)
06/12/2021 2:50 PM   Brenda [name] [birth year] [id number]  Referring provider: Juline Patch, MD [address] [place],  [place] [address]  Chief Complaint  Patient presents with   Follow-up    3 week follow-up/PVR    Urological history: 1. Incontinence -contributing factors of age, diabetes, depression, pelvic surgery and COPD -failed OAB medications and PTNS -PVR 20 mL  Chief Complaint  Patient presents with   Follow-up    3 week follow-up/PVR     HPI: Brenda [name] is a 87 y.o. female who presents today after a 3 week trial of Gemtesa 75 mg.    She feels that the Shepherd did not help her at all.  She continues to "pee, pee, pee."   She states the worse time for incontinence is 3 am to 8 am.  She just soaks the pads.    Patient denies any modifying or aggravating factors.  Patient denies any gross hematuria, dysuria or suprapubic/flank pain.  Patient denies any fevers, chills, nausea or vomiting.    PVR 20 mL  PMH: Past Medical History:  Diagnosis Date   Arthritis    Blood in stool    Cancer ([place])    Chicken pox    Diabetes mellitus 2000   Dry skin    GERD (gastroesophageal reflux disease)    Hiatal hernia    Hypertension    Kidney infection 2014   Osteoporosis    Ovarian cancer ([place]) 1991   s/p hysterectomy at [facility]   Pernicious anemia    Spinal stenosis    Ulcer    Urinary incontinence     Surgical History: Past Surgical History:  Procedure Laterality Date   ABDOMINAL HYSTERECTOMY     FOOT SURGERY  2000   SHOULDER SURGERY  2013   fracture right shoulder    Home Medications:  Allergies as of 06/12/2021       Reactions   Aspirin Other (See Comments)   bleeding   Sulfa Antibiotics Swelling   Oxybutynin Other (See Comments)   Dry mouth        Medication List        Accurate as of June 12, 2021  2:50 PM. If you have any questions, ask your nurse or doctor.          STOP taking these medications    cetirizine 5 MG  tablet Commonly known as: ZYRTEC Stopped by: Brenda Eke, PA-C   tobramycin 0.3 % ophthalmic solution Commonly known as: TOBREX Stopped by: Brenda Tasso, PA-C       TAKE these medications    Accu-Chek Aviva Plus test strip Generic drug: glucose blood USE 1 STRIP TO CHECK GLUCOSE THREE TIMES DAILY   Accu-Chek Softclix Lancets lancets 3 (three) times daily. Use as instructed. E11.42   cholecalciferol 1000 units tablet Commonly known as: VITAMIN D Take 2,000 Units by mouth daily.   cyanocobalamin 1000 MCG/ML injection Commonly known as: (VITAMIN B-12) Inject 1,000 mcg into the muscle every 30 (thirty) days.   ferrous sulfate 324 (65 Fe) MG Tbec Take 1 tablet (325 mg total) by mouth daily.   Gemtesa 75 MG Tabs Generic drug: Vibegron Take 75 mg by mouth daily.   glipiZIDE 10 MG tablet Commonly known as: GLUCOTROL Take 1 tablet by mouth 2 (two) times a day.   hydrOXYzine 10 MG tablet Commonly known as: ATARAX/VISTARIL Take 1 tablet (10 mg total) by mouth at bedtime as needed. For itching   lisinopril  5 MG tablet Commonly known as: ZESTRIL TAKE 1 TABLET(5 MG) BY MOUTH DAILY   metFORMIN 1000 MG tablet Commonly known as: GLUCOPHAGE Take 1 tablet (1,000 mg total) by mouth 2 (two) times daily with a meal.   montelukast 10 MG tablet Commonly known as: SINGULAIR Take 1 tablet (10 mg total) by mouth at bedtime.   nystatin cream Commonly known as: MYCOSTATIN Apply 1 application topically 2 (two) times daily.   pioglitazone 15 MG tablet Commonly known as: ACTOS Take 1 tablet by mouth daily.        Allergies:  Allergies  Allergen Reactions   Aspirin Other (See Comments)    bleeding   Sulfa Antibiotics Swelling   Oxybutynin Other (See Comments)    Dry mouth    Family History: Family History  Problem Relation Age of Onset   Arthritis Mother    Cancer Mother        colon   Arthritis Father    Cancer Father        colon   Cancer Sister         lung   Cancer Other        lung    Social History:  reports that she has never smoked. She has never used smokeless tobacco. She reports that she does not drink alcohol and does not use drugs.  ROS: Pertinent ROS in HPI  Physical Exam: BP (!) 157/84   Pulse 84   Ht 5\' 5"  (1.651 m)   Wt 173 lb (78.5 kg)   BMI 28.79 kg/m   Constitutional:  Well nourished. Alert and oriented, No acute distress. HEENT: [place] AT, mask in place.  Trachea midline Cardiovascular: No clubbing, cyanosis, or edema. Respiratory: Normal respiratory effort, no increased work of breathing. Neurologic: Grossly intact, no focal deficits, moving all 4 extremities. Psychiatric: Normal mood and affect. Laboratory Data: N/A   Pertinent Imaging: Results for Brenda, [name] (MRN [id number]) as of 06/12/2021 14:37  Ref. Range 06/12/2021 14:33  Scan Result Unknown 35ml     Assessment & Plan:    1. Urge incontinence -no change with Gemtesa 75 mg daily  - she states that she has learned to live with it  2. Increase in frequency -she states that she has been concerned that her increase in frequency at times is a sign of an infection -she will call the office for an UA, UCX and office visit for these events   3. Nocturia - I explained to the patient that nocturia is often multi-factorial and difficult to treat.  Sleeping disorders, heart conditions, peripheral vascular disease, diabetes, an enlarged prostate for men, an urethral stricture causing bladder outlet obstruction and/or certain medications can contribute to nocturia. - I have suggested that the patient avoid caffeine after noon and alcohol in the evening.  He or she may also benefit from fluid restrictions after 6:00 in the evening and voiding just prior to bedtime. - I have explained that research studies have showed that over 84% of patients with sleep apnea reported frequent nighttime urination.   With sleep apnea, oxygen decreases, carbon dioxide increases,  the blood become more acidic, the heart rate drops and blood vessels in the lung constrict.  The body is then alerted that something is very wrong. The sleeper must wake enough to reopen the airway. By this time, the heart is racing and experiences a false signal of fluid overload. The heart excretes a hormone-like protein that tells the body to get rid of  sodium and water, resulting in nocturia. -  I also informed the patient that a recent study noted that decreasing sodium intake to 2.3 grams daily, if they don't have issues with hyponatremia, can also reduce the number of nightly voids - There is also an increased incidence in sleep apnea with menopause, symptoms include night sweats, daytime sleepiness, depressed mood, and cognitive complaints like poor concentration or problems with short-term memory  -she states that she has tested positive for sleep apnea but couldn't stand the CPAP machine - The patient may benefit from a discussion with his or her primary care physician to see if the study can be repeated   Return if symptoms worsen or fail to improve.  These notes generated with voice recognition software. I apologize for typographical errors.  Zara Council, PA-C  [facility] [address]  [place], [place] [address] [phone]

## 2021-06-12 ENCOUNTER — Other Ambulatory Visit: Payer: Self-pay

## 2021-06-12 ENCOUNTER — Ambulatory Visit (INDEPENDENT_AMBULATORY_CARE_PROVIDER_SITE_OTHER): Payer: Medicare Other | Admitting: Urology

## 2021-06-12 ENCOUNTER — Encounter: Payer: Self-pay | Admitting: Urology

## 2021-06-12 VITALS — BP 157/84 | HR 84 | Ht 65.0 in | Wt 173.0 lb

## 2021-06-12 DIAGNOSIS — N3941 Urge incontinence: Secondary | ICD-10-CM | POA: Diagnosis not present

## 2021-06-12 DIAGNOSIS — R351 Nocturia: Secondary | ICD-10-CM

## 2021-06-12 DIAGNOSIS — N3281 Overactive bladder: Secondary | ICD-10-CM

## 2021-06-12 LAB — BLADDER SCAN AMB NON-IMAGING

## 2021-06-15 DIAGNOSIS — E119 Type 2 diabetes mellitus without complications: Secondary | ICD-10-CM | POA: Diagnosis not present

## 2021-06-15 LAB — HM DIABETES EYE EXAM

## 2021-06-22 ENCOUNTER — Telehealth: Payer: Self-pay

## 2021-06-22 NOTE — Telephone Encounter (Signed)
Called and told pt to take Aleve for arm and hand pain. She has not tried anti-inflammatory. Also, told her she will need to have evaluated if it doesn't get better

## 2021-06-22 NOTE — Telephone Encounter (Signed)
Copied from [place] [id number]. Topic: General - Inquiry >> Jun 22, 2021 12:02 PM Greggory Keen D wrote: Reason for CRM: Pt called asking if Brenda [name] will call her back.  She said her hands are hurting and with possible RA.  She does not really want to come in  CB#  [phone]

## 2021-06-23 ENCOUNTER — Emergency Department
Admission: EM | Admit: 2021-06-23 | Discharge: 2021-06-23 | Disposition: A | Discharge status: Home / Self Care | Payer: Medicare Other | Attending: Student in an Organized Health Care Education/Training Program | Admitting: Student in an Organized Health Care Education/Training Program

## 2021-06-23 ENCOUNTER — Emergency Department: Payer: Medicare Other

## 2021-06-23 ENCOUNTER — Encounter: Payer: Self-pay | Admitting: Emergency Medicine

## 2021-06-23 ENCOUNTER — Other Ambulatory Visit: Payer: Self-pay

## 2021-06-23 DIAGNOSIS — Z79899 Other long term (current) drug therapy: Secondary | ICD-10-CM | POA: Diagnosis not present

## 2021-06-23 DIAGNOSIS — R079 Chest pain, unspecified: Secondary | ICD-10-CM | POA: Diagnosis not present

## 2021-06-23 DIAGNOSIS — Z7984 Long term (current) use of oral hypoglycemic drugs: Secondary | ICD-10-CM | POA: Diagnosis not present

## 2021-06-23 DIAGNOSIS — M79601 Pain in right arm: Secondary | ICD-10-CM | POA: Diagnosis not present

## 2021-06-23 DIAGNOSIS — E1140 Type 2 diabetes mellitus with diabetic neuropathy, unspecified: Secondary | ICD-10-CM | POA: Diagnosis not present

## 2021-06-23 DIAGNOSIS — J9811 Atelectasis: Secondary | ICD-10-CM | POA: Diagnosis not present

## 2021-06-23 DIAGNOSIS — I10 Essential (primary) hypertension: Secondary | ICD-10-CM | POA: Diagnosis not present

## 2021-06-23 DIAGNOSIS — M25511 Pain in right shoulder: Secondary | ICD-10-CM | POA: Diagnosis not present

## 2021-06-23 DIAGNOSIS — J449 Chronic obstructive pulmonary disease, unspecified: Secondary | ICD-10-CM | POA: Diagnosis not present

## 2021-06-23 DIAGNOSIS — Z8543 Personal history of malignant neoplasm of ovary: Secondary | ICD-10-CM | POA: Insufficient documentation

## 2021-06-23 DIAGNOSIS — S59911A Unspecified injury of right forearm, initial encounter: Secondary | ICD-10-CM | POA: Diagnosis not present

## 2021-06-23 DIAGNOSIS — K449 Diaphragmatic hernia without obstruction or gangrene: Secondary | ICD-10-CM | POA: Diagnosis not present

## 2021-06-23 LAB — COMPREHENSIVE METABOLIC PANEL
ALT: 18 U/L (ref 0–44)
AST: 29 U/L (ref 15–41)
Albumin: 4.4 g/dL (ref 3.5–5.0)
Alkaline Phosphatase: 74 U/L (ref 38–126)
Anion gap: 12 (ref 5–15)
BUN: 26 mg/dL — ABNORMAL HIGH (ref 8–23)
CO2: 25 mmol/L (ref 22–32)
Calcium: 10.6 mg/dL — ABNORMAL HIGH (ref 8.9–10.3)
Chloride: 99 mmol/L (ref 98–111)
Creatinine, Ser: 1.41 mg/dL — ABNORMAL HIGH (ref 0.44–1.00)
GFR, Estimated: 36 mL/min — ABNORMAL LOW (ref >60)
Glucose, Bld: 313 mg/dL — ABNORMAL HIGH (ref 70–99)
Potassium: 4.1 mmol/L (ref 3.5–5.1)
Sodium: 136 mmol/L (ref 135–145)
Total Bilirubin: 0.8 mg/dL (ref 0.3–1.2)
Total Protein: 7.6 g/dL (ref 6.5–8.1)

## 2021-06-23 LAB — CBC
HCT: 40.5 % (ref 36.0–46.0)
Hemoglobin: 14.2 g/dL (ref 12.0–15.0)
MCH: 35.6 pg — ABNORMAL HIGH (ref 26.0–34.0)
MCHC: 35.1 g/dL (ref 30.0–36.0)
MCV: 101.5 fL — ABNORMAL HIGH (ref 80.0–100.0)
Platelets: 278 10*3/uL (ref 150–400)
RBC: 3.99 MIL/uL (ref 3.87–5.11)
RDW: 11.8 % (ref 11.5–15.5)
WBC: 9.3 10*3/uL (ref 4.0–10.5)
nRBC: 0 % (ref 0.0–0.2)

## 2021-06-23 LAB — TROPONIN I (HIGH SENSITIVITY)
Troponin I (High Sensitivity): 9 ng/L (ref <18)
Troponin I (High Sensitivity): 8 ng/L (ref <18)

## 2021-06-23 MED ORDER — ACETAMINOPHEN 325 MG PO TABS
650.0000 mg | ORAL_TABLET | Freq: Once | ORAL | Status: AC
  Administered 2021-06-23: 650 mg via ORAL
  Filled 2021-06-23: qty 2

## 2021-06-23 MED ORDER — LIDOCAINE 5 % EX PTCH: 1.0000 | MEDICATED_PATCH | Freq: Two times a day (BID) | CUTANEOUS | 0 refills | Status: AC

## 2021-06-23 MED ORDER — TRAMADOL HCL 50 MG PO TABS
25.0000 mg | ORAL_TABLET | Freq: Once | ORAL | Status: AC
  Administered 2021-06-23: 25 mg via ORAL
  Filled 2021-06-23: qty 1

## 2021-06-23 MED ORDER — SODIUM CHLORIDE 0.9 % IV BOLUS
500.0000 mL | Freq: Once | INTRAVENOUS | Status: AC
  Administered 2021-06-23: 500 mL via INTRAVENOUS

## 2021-06-23 MED ORDER — LIDOCAINE 5 % EX PTCH
1.0000 | MEDICATED_PATCH | CUTANEOUS | Status: DC
  Administered 2021-06-23: 1 via TRANSDERMAL
  Filled 2021-06-23: qty 1

## 2021-06-23 MED ORDER — IOHEXOL 350 MG/ML SOLN
60.0000 mL | Freq: Once | INTRAVENOUS | Status: AC | PRN
  Administered 2021-06-23: 60 mL via INTRAVENOUS
  Filled 2021-06-23: qty 60

## 2021-06-23 NOTE — ED Provider Notes (Signed)
[facility] Emergency Department Provider Note    Event Date/Time   First MD Initiated Contact with Patient 06/23/21 1556     (approximate)  I have reviewed the triage vital signs and the nursing notes.   HISTORY  Chief Complaint Arm Injury    HPI Brenda [name] is a 87 y.o. female below listed past medical history presents to the ER for evaluation of right arm pain for the past several days.  No associated chest pain or shortness of breath.  No numbness or tingling.  States there is in the mid forearm and elbow and radiating up into her shoulder.  No indigestion.  Is never had pain like this before.  No trauma.  Past Medical History:  Diagnosis Date   Arthritis    Blood in stool    Cancer ([place])    Chicken pox    Diabetes mellitus 2000   Dry skin    GERD (gastroesophageal reflux disease)    Hiatal hernia    Hypertension    Kidney infection 2014   Osteoporosis    Ovarian cancer ([place]) 1991   s/p hysterectomy at [facility]   Pernicious anemia    Spinal stenosis    Ulcer    Urinary incontinence    Family History  Problem Relation Age of Onset   Arthritis Mother    Cancer Mother        colon   Arthritis Father    Cancer Father        colon   Cancer Sister        lung   Cancer Other        lung   Past Surgical History:  Procedure Laterality Date   ABDOMINAL HYSTERECTOMY     FOOT SURGERY  2000   SHOULDER SURGERY  2013   fracture right shoulder   Patient Active Problem List   Diagnosis Date Noted   Chronic obstructive pulmonary disease ([place]) 05/29/2019   Hypertension associated with type 2 diabetes mellitus ([place]) 03/09/2019   Diabetic peripheral neuropathy associated with type 2 diabetes mellitus ([place]) 07/25/2018   Persistent microalbuminuria associated with type 2 diabetes mellitus ([place]) 07/22/2018   Screening for breast cancer 08/18/2015   B12 deficiency 12/02/2014   Lumbago 07/09/2014   Shortness of breath 06/30/2014   Obese  06/30/2014   Microalbuminuria 06/10/2014   Right facial pain 05/18/2014   Anemia 01/01/2014   Osteoarthritis 07/31/2013   Other malaise and fatigue 07/09/2013   Muscle cramp, nocturnal 03/25/2013   Increased frequency of urination 02/09/2013   Urge incontinence 02/09/2013   Medicare annual wellness visit, subsequent 12/02/2012   Eczema 12/02/2012   Other and unspecified hyperlipidemia 12/02/2012   Essential hypertension, benign 12/02/2012   Chronic low back pain 08/28/2012   Osteoporosis 07/23/2012   Nocturia 06/26/2012   Paresthesia of bilateral legs 05/30/2012   Pernicious anemia 05/30/2012   Type 2 diabetes mellitus with hyperglycemia, without long-term current use of insulin ([place]) 05/30/2012   Depression 05/30/2012   Insomnia 05/30/2012   Weakness of both legs 05/30/2012      Prior to Admission medications   Medication Sig Start Date End Date Taking? Authorizing Provider  ACCU-CHEK AVIVA PLUS test strip USE 1 STRIP TO CHECK GLUCOSE THREE TIMES DAILY 12/09/18   [provider]  Accu-Chek Softclix Lancets lancets 3 (three) times daily. Use as instructed. E11.42 05/29/17   [provider]  cholecalciferol (VITAMIN D) 1000 units tablet Take 2,000 Units by mouth daily.  [provider]  cyanocobalamin (,VITAMIN B-12,) 1000 MCG/ML injection Inject 1,000 mcg into the muscle every 30 (thirty) days.    [provider]  ferrous sulfate 324 (65 Fe) MG TBEC Take 1 tablet (325 mg total) by mouth daily. 04/11/16   Juline Patch, MD  glipiZIDE (GLUCOTROL) 10 MG tablet Take 1 tablet by mouth 2 (two) times a day. 01/16/19   [provider]  hydrOXYzine (ATARAX/VISTARIL) 10 MG tablet Take 1 tablet (10 mg total) by mouth at bedtime as needed. For itching 03/28/21   Juline Patch, MD  lisinopril (ZESTRIL) 5 MG tablet TAKE 1 TABLET(5 MG) BY MOUTH DAILY 04/30/20   Juline Patch, MD  metFORMIN (GLUCOPHAGE) 1000 MG tablet Take 1 tablet (1,000 mg total) by  mouth 2 (two) times daily with a meal. 04/11/16   Juline Patch, MD  montelukast (SINGULAIR) 10 MG tablet Take 1 tablet (10 mg total) by mouth at bedtime. 01/25/21   Juline Patch, MD  nystatin cream (MYCOSTATIN) Apply 1 application topically 2 (two) times daily. 04/13/21   Juline Patch, MD  pioglitazone (ACTOS) 15 MG tablet Take 1 tablet by mouth daily. 01/24/19   [provider]  Vibegron (GEMTESA) 75 MG TABS Take 75 mg by mouth daily. 05/22/21   Zara Council A, PA-C    Allergies Aspirin, Sulfa antibiotics, and Oxybutynin    Social History Social History   Tobacco Use   Smoking status: Never   Smokeless tobacco: Never   Tobacco comments:    smoking cessation materials not required  Vaping Use   Vaping Use: Never used  Substance Use Topics   Alcohol use: No   Drug use: No    Review of Systems Patient denies headaches, rhinorrhea, blurry vision, numbness, shortness of breath, chest pain, edema, cough, abdominal pain, nausea, vomiting, diarrhea, dysuria, fevers, rashes or hallucinations unless otherwise stated above in HPI. ____________________________________________   PHYSICAL EXAM:  VITAL SIGNS: Vitals:   06/23/21 1429  BP: (!) 148/111  Pulse: 99  Resp: 18  Temp: 98.7 F (37.1 C)  SpO2: 94%    Constitutional: Alert and oriented.  Eyes: Conjunctivae are normal.  Head: Atraumatic. Nose: No congestion/rhinnorhea. Mouth/Throat: Mucous membranes are moist.   Neck: No stridor. Painless ROM.  Cardiovascular: Normal rate, regular rhythm. Grossly normal heart sounds.  Good peripheral circulation. Respiratory: Normal respiratory effort.  No retractions. Lungs CTAB. Gastrointestinal: Soft and nontender. No distention. No abdominal bruits. No CVA tenderness. Genitourinary:  Musculoskeletal: No lower extremity tenderness nor edema.  No joint effusions. Neurologic:  Normal speech and language. No gross focal neurologic deficits are appreciated. No facial  droop Skin:  Skin is warm, dry and intact. No rash noted. Psychiatric: Mood and affect are normal. Speech and behavior are normal.  ____________________________________________   LABS (all labs ordered are listed, but only abnormal results are displayed)  Results for orders placed or performed during the hospital encounter of 06/23/21 (from the past 24 hour(s))  Troponin I (High Sensitivity)     Status: None   Collection Time: 06/23/21  4:17 PM  Result Value Ref Range   Troponin I (High Sensitivity) 9 <18 ng/L  CBC     Status: Abnormal   Collection Time: 06/23/21  4:17 PM  Result Value Ref Range   WBC 9.3 4.0 - 10.5 K/uL   RBC 3.99 3.87 - 5.11 MIL/uL   Hemoglobin 14.2 12.0 - 15.0 g/dL   HCT 40.5 36.0 - 46.0 %   MCV  101.5 (H) 80.0 - 100.0 fL   MCH 35.6 (H) 26.0 - 34.0 pg   MCHC 35.1 30.0 - 36.0 g/dL   RDW 11.8 11.5 - 15.5 %   Platelets 278 150 - 400 K/uL   nRBC 0.0 0.0 - 0.2 %  Comprehensive metabolic panel     Status: Abnormal   Collection Time: 06/23/21  4:17 PM  Result Value Ref Range   Sodium 136 135 - 145 mmol/L   Potassium 4.1 3.5 - 5.1 mmol/L   Chloride 99 98 - 111 mmol/L   CO2 25 22 - 32 mmol/L   Glucose, Bld 313 (H) 70 - 99 mg/dL   BUN 26 (H) 8 - 23 mg/dL   Creatinine, Ser 1.41 (H) 0.44 - 1.00 mg/dL   Calcium 10.6 (H) 8.9 - 10.3 mg/dL   Total Protein 7.6 6.5 - 8.1 g/dL   Albumin 4.4 3.5 - 5.0 g/dL   AST 29 15 - 41 U/L   ALT 18 0 - 44 U/L   Alkaline Phosphatase 74 38 - 126 U/L   Total Bilirubin 0.8 0.3 - 1.2 mg/dL   GFR, Estimated 36 (L) >60 mL/min   Anion gap 12 5 - 15   ____________________________________________  EKG My review and personal interpretation at Time: 16:54   Indication: right arm  Rate: 90  Rhythm: sinus Axis: left Other: poor r wave progression, no stemi ____________________________________________  RADIOLOGY  I personally reviewed all radiographic images ordered to evaluate for the above acute complaints and reviewed radiology reports  and findings.  These findings were personally discussed with the patient.  Please see medical record for radiology report.  ____________________________________________   PROCEDURES  Procedure(s) performed:  Procedures    Critical Care performed: no ____________________________________________   INITIAL IMPRESSION / ASSESSMENT AND PLAN / ED COURSE  Pertinent labs & imaging results that were available during my care of the patient were reviewed by me and considered in my medical decision making (see chart for details).   DDX: arthritis, msk strain, radiculopathy, dissection, acs  LYNNDA WIERSMA is a 87 y.o. who presents to the ED with [place] as described above.  Patient nontoxic-appearing complaining of right arm pain without trauma.  X-ray reassuring.  Does have large hiatal hernia.  EKG is nonischemic.  Discomfort is been going on for more than a week have a lower suspicion for ACS given her age and risk factors will order troponin and monitor.  Clinical Course as of 06/23/21 2000  Fri Jun 23, 2021  1719 Patient states that her pain is increasing.  Given new slightly worsening renal dysfunction pain in her chest and right shoulder pain will order CTA to rule out dissection. [PR]  1959 CT imaging does show evidence of large hiatal hernia no sign of dissection.  Pain seems to be very musculoskeletal in the right arm I have a lower suspicion for ACS but given age and risk factors will repeat troponin.  She is receiving IV fluids as her renal function is mildly worsened.  We will repeat troponin as long as negative I do believe she is stable and appropriate for discharge home.  Discussed need for outpatient follow-up. [PR]    Clinical Course User Index [PR] Merlyn Lot, MD    The patient was evaluated in Emergency Department today for the symptoms described in the history of present illness. He/she was evaluated in the context of the global COVID-19 pandemic, which necessitated  consideration that the patient might be at risk for  infection with the SARS-CoV-2 virus that causes COVID-19. Institutional protocols and algorithms that pertain to the evaluation of patients at risk for COVID-19 are in a state of rapid change based on information released by regulatory bodies including the CDC and federal and state organizations. These policies and algorithms were followed during the patient's care in the ED.  As part of my medical decision making, I reviewed the following data within the [place] notes reviewed and incorporated, Labs reviewed, notes from prior ED visits and  Controlled Substance Database   ____________________________________________   FINAL CLINICAL IMPRESSION(S) / ED DIAGNOSES  Final diagnoses:  Right arm pain      NEW MEDICATIONS STARTED DURING THIS VISIT:  New Prescriptions   No medications on file     Note:  This document was prepared using Dragon voice recognition software and may include unintentional dictation errors.    Merlyn Lot, MD 06/23/21 2001

## 2021-06-23 NOTE — ED Notes (Signed)
Pt returned from CT via stretcher with CT tech.

## 2021-06-23 NOTE — ED Provider Notes (Signed)
Emergency Medicine Provider Triage Evaluation Note  Brenda [name] , a 87 y.o. female  was evaluated in triage.  Pt complains of non-traumatic right arm pain that started on Monday. Pain is from elbow to hand. Nothing seems to make it better or worse.  Review of Systems  Positive: Right arm pain Negative: Numbness, tingling, focal weakness.  Physical Exam  BP (!) 148/111   Pulse 99   Temp 98.7 F (37.1 C) (Oral)   Resp 18   Ht 5\' 5"  (1.651 m)   Wt 78.9 kg   SpO2 94%   BMI 28.96 kg/m  Gen:   Awake, no distress   Resp:  Normal effort  MSK:   Moves extremities without difficulty  Other:    Medical Decision Making  Medically screening exam initiated at 2:32 PM.  Appropriate orders placed.  Brenda [name] was informed that the remainder of the evaluation will be completed by another provider, this initial triage assessment does not replace that evaluation, and the importance of remaining in the ED until their evaluation is complete.    Brenda Dike, FNP 06/23/21 1557    Brenda Drafts, MD 06/26/21 [phone]

## 2021-06-23 NOTE — ED Notes (Signed)
Patient transported to CT via stretcher with CT tech. °

## 2021-06-23 NOTE — ED Triage Notes (Signed)
Pt reports that she woke up on Monday morning with her right arm hurting from her shoulder to her finger tips. It has not let up. No known injury. No swelling or deformity seen.

## 2021-06-23 NOTE — ED Triage Notes (Signed)
Patient to ED via POV with grandson for right arm pain since Monday. No known injury- pain radiates from shoulder to finger tips. Patient Aox4 at this time.

## 2021-06-28 DIAGNOSIS — R410 Disorientation, unspecified: Secondary | ICD-10-CM | POA: Diagnosis not present

## 2021-06-28 DIAGNOSIS — R300 Dysuria: Secondary | ICD-10-CM | POA: Diagnosis not present

## 2021-06-28 DIAGNOSIS — F05 Delirium due to known physiological condition: Secondary | ICD-10-CM | POA: Diagnosis not present

## 2021-06-28 DIAGNOSIS — D520 Dietary folate deficiency anemia: Secondary | ICD-10-CM | POA: Diagnosis not present

## 2021-06-28 DIAGNOSIS — Z6830 Body mass index (BMI) 30.0-30.9, adult: Secondary | ICD-10-CM | POA: Diagnosis not present

## 2021-06-28 DIAGNOSIS — N3000 Acute cystitis without hematuria: Secondary | ICD-10-CM | POA: Diagnosis not present

## 2021-06-28 DIAGNOSIS — E1165 Type 2 diabetes mellitus with hyperglycemia: Secondary | ICD-10-CM | POA: Diagnosis not present

## 2021-06-28 DIAGNOSIS — E872 Acidosis, unspecified: Secondary | ICD-10-CM | POA: Diagnosis not present

## 2021-06-28 DIAGNOSIS — G9341 Metabolic encephalopathy: Secondary | ICD-10-CM | POA: Diagnosis not present

## 2021-06-29 DIAGNOSIS — R222 Localized swelling, mass and lump, trunk: Secondary | ICD-10-CM | POA: Diagnosis not present

## 2021-06-29 DIAGNOSIS — G9340 Encephalopathy, unspecified: Secondary | ICD-10-CM | POA: Diagnosis not present

## 2021-06-29 DIAGNOSIS — M1990 Unspecified osteoarthritis, unspecified site: Secondary | ICD-10-CM | POA: Diagnosis present

## 2021-06-29 DIAGNOSIS — E872 Acidosis, unspecified: Secondary | ICD-10-CM | POA: Diagnosis not present

## 2021-06-29 DIAGNOSIS — Z87440 Personal history of urinary (tract) infections: Secondary | ICD-10-CM | POA: Diagnosis not present

## 2021-06-29 DIAGNOSIS — M545 Low back pain, unspecified: Secondary | ICD-10-CM | POA: Diagnosis present

## 2021-06-29 DIAGNOSIS — R279 Unspecified lack of coordination: Secondary | ICD-10-CM | POA: Diagnosis not present

## 2021-06-29 DIAGNOSIS — Z20822 Contact with and (suspected) exposure to covid-19: Secondary | ICD-10-CM | POA: Diagnosis not present

## 2021-06-29 DIAGNOSIS — R339 Retention of urine, unspecified: Secondary | ICD-10-CM | POA: Diagnosis not present

## 2021-06-29 DIAGNOSIS — R32 Unspecified urinary incontinence: Secondary | ICD-10-CM | POA: Diagnosis not present

## 2021-06-29 DIAGNOSIS — N390 Urinary tract infection, site not specified: Secondary | ICD-10-CM | POA: Diagnosis not present

## 2021-06-29 DIAGNOSIS — R739 Hyperglycemia, unspecified: Secondary | ICD-10-CM | POA: Diagnosis not present

## 2021-06-29 DIAGNOSIS — R451 Restlessness and agitation: Secondary | ICD-10-CM | POA: Diagnosis not present

## 2021-06-29 DIAGNOSIS — E119 Type 2 diabetes mellitus without complications: Secondary | ICD-10-CM | POA: Diagnosis not present

## 2021-06-29 DIAGNOSIS — R9401 Abnormal electroencephalogram [EEG]: Secondary | ICD-10-CM | POA: Diagnosis not present

## 2021-06-29 DIAGNOSIS — D510 Vitamin B12 deficiency anemia due to intrinsic factor deficiency: Secondary | ICD-10-CM | POA: Diagnosis not present

## 2021-06-29 DIAGNOSIS — R531 Weakness: Secondary | ICD-10-CM | POA: Diagnosis present

## 2021-06-29 DIAGNOSIS — R8271 Bacteriuria: Secondary | ICD-10-CM | POA: Diagnosis not present

## 2021-06-29 DIAGNOSIS — R000 Tachycardia, unspecified: Secondary | ICD-10-CM | POA: Diagnosis not present

## 2021-06-29 DIAGNOSIS — I129 Hypertensive chronic kidney disease with stage 1 through stage 4 chronic kidney disease, or unspecified chronic kidney disease: Secondary | ICD-10-CM | POA: Diagnosis present

## 2021-06-29 DIAGNOSIS — I10 Essential (primary) hypertension: Secondary | ICD-10-CM | POA: Diagnosis not present

## 2021-06-29 DIAGNOSIS — E875 Hyperkalemia: Secondary | ICD-10-CM | POA: Diagnosis not present

## 2021-06-29 DIAGNOSIS — N3000 Acute cystitis without hematuria: Secondary | ICD-10-CM | POA: Diagnosis present

## 2021-06-29 DIAGNOSIS — D520 Dietary folate deficiency anemia: Secondary | ICD-10-CM | POA: Diagnosis present

## 2021-06-29 DIAGNOSIS — I6782 Cerebral ischemia: Secondary | ICD-10-CM | POA: Diagnosis not present

## 2021-06-29 DIAGNOSIS — E860 Dehydration: Secondary | ICD-10-CM | POA: Diagnosis not present

## 2021-06-29 DIAGNOSIS — F32A Depression, unspecified: Secondary | ICD-10-CM | POA: Diagnosis present

## 2021-06-29 DIAGNOSIS — F05 Delirium due to known physiological condition: Secondary | ICD-10-CM | POA: Diagnosis present

## 2021-06-29 DIAGNOSIS — G8220 Paraplegia, unspecified: Secondary | ICD-10-CM | POA: Diagnosis not present

## 2021-06-29 DIAGNOSIS — R4182 Altered mental status, unspecified: Secondary | ICD-10-CM | POA: Diagnosis not present

## 2021-06-29 DIAGNOSIS — R5383 Other fatigue: Secondary | ICD-10-CM | POA: Diagnosis not present

## 2021-06-29 DIAGNOSIS — M810 Age-related osteoporosis without current pathological fracture: Secondary | ICD-10-CM | POA: Diagnosis not present

## 2021-06-29 DIAGNOSIS — A499 Bacterial infection, unspecified: Secondary | ICD-10-CM | POA: Diagnosis not present

## 2021-06-29 DIAGNOSIS — E785 Hyperlipidemia, unspecified: Secondary | ICD-10-CM | POA: Diagnosis present

## 2021-06-29 DIAGNOSIS — R7989 Other specified abnormal findings of blood chemistry: Secondary | ICD-10-CM | POA: Diagnosis not present

## 2021-06-29 DIAGNOSIS — K219 Gastro-esophageal reflux disease without esophagitis: Secondary | ICD-10-CM | POA: Diagnosis present

## 2021-06-29 DIAGNOSIS — B9620 Unspecified Escherichia coli [E. coli] as the cause of diseases classified elsewhere: Secondary | ICD-10-CM | POA: Diagnosis not present

## 2021-06-29 DIAGNOSIS — R5381 Other malaise: Secondary | ICD-10-CM | POA: Diagnosis not present

## 2021-06-29 DIAGNOSIS — M6281 Muscle weakness (generalized): Secondary | ICD-10-CM | POA: Diagnosis not present

## 2021-06-29 DIAGNOSIS — Z7984 Long term (current) use of oral hypoglycemic drugs: Secondary | ICD-10-CM | POA: Diagnosis not present

## 2021-06-29 DIAGNOSIS — R410 Disorientation, unspecified: Secondary | ICD-10-CM | POA: Diagnosis not present

## 2021-06-29 DIAGNOSIS — I160 Hypertensive urgency: Secondary | ICD-10-CM | POA: Diagnosis not present

## 2021-06-29 DIAGNOSIS — E1122 Type 2 diabetes mellitus with diabetic chronic kidney disease: Secondary | ICD-10-CM | POA: Diagnosis present

## 2021-06-29 DIAGNOSIS — E1165 Type 2 diabetes mellitus with hyperglycemia: Secondary | ICD-10-CM | POA: Diagnosis present

## 2021-06-29 DIAGNOSIS — G9341 Metabolic encephalopathy: Secondary | ICD-10-CM | POA: Diagnosis present

## 2021-06-29 DIAGNOSIS — R202 Paresthesia of skin: Secondary | ICD-10-CM | POA: Diagnosis not present

## 2021-06-29 DIAGNOSIS — Z882 Allergy status to sulfonamides status: Secondary | ICD-10-CM | POA: Diagnosis not present

## 2021-06-29 DIAGNOSIS — N189 Chronic kidney disease, unspecified: Secondary | ICD-10-CM | POA: Diagnosis present

## 2021-07-02 DIAGNOSIS — G9340 Encephalopathy, unspecified: Secondary | ICD-10-CM | POA: Diagnosis not present

## 2021-07-02 DIAGNOSIS — R9401 Abnormal electroencephalogram [EEG]: Secondary | ICD-10-CM | POA: Diagnosis not present

## 2021-07-02 DIAGNOSIS — R4182 Altered mental status, unspecified: Secondary | ICD-10-CM | POA: Diagnosis not present

## 2021-07-10 DIAGNOSIS — J811 Chronic pulmonary edema: Secondary | ICD-10-CM | POA: Diagnosis not present

## 2021-07-10 DIAGNOSIS — N183 Chronic kidney disease, stage 3 unspecified: Secondary | ICD-10-CM | POA: Diagnosis present

## 2021-07-10 DIAGNOSIS — E785 Hyperlipidemia, unspecified: Secondary | ICD-10-CM | POA: Diagnosis present

## 2021-07-10 DIAGNOSIS — G9341 Metabolic encephalopathy: Secondary | ICD-10-CM | POA: Diagnosis present

## 2021-07-10 DIAGNOSIS — I10 Essential (primary) hypertension: Secondary | ICD-10-CM | POA: Diagnosis not present

## 2021-07-10 DIAGNOSIS — D510 Vitamin B12 deficiency anemia due to intrinsic factor deficiency: Secondary | ICD-10-CM | POA: Diagnosis not present

## 2021-07-10 DIAGNOSIS — A499 Bacterial infection, unspecified: Secondary | ICD-10-CM | POA: Diagnosis not present

## 2021-07-10 DIAGNOSIS — B952 Enterococcus as the cause of diseases classified elsewhere: Secondary | ICD-10-CM | POA: Diagnosis present

## 2021-07-10 DIAGNOSIS — D649 Anemia, unspecified: Secondary | ICD-10-CM | POA: Diagnosis not present

## 2021-07-10 DIAGNOSIS — G928 Other toxic encephalopathy: Secondary | ICD-10-CM | POA: Diagnosis present

## 2021-07-10 DIAGNOSIS — R001 Bradycardia, unspecified: Secondary | ICD-10-CM | POA: Diagnosis not present

## 2021-07-10 DIAGNOSIS — E8729 Other acidosis: Secondary | ICD-10-CM | POA: Diagnosis present

## 2021-07-10 DIAGNOSIS — R339 Retention of urine, unspecified: Secondary | ICD-10-CM | POA: Diagnosis not present

## 2021-07-10 DIAGNOSIS — G8220 Paraplegia, unspecified: Secondary | ICD-10-CM | POA: Diagnosis not present

## 2021-07-10 DIAGNOSIS — K449 Diaphragmatic hernia without obstruction or gangrene: Secondary | ICD-10-CM | POA: Diagnosis present

## 2021-07-10 DIAGNOSIS — R5381 Other malaise: Secondary | ICD-10-CM | POA: Diagnosis not present

## 2021-07-10 DIAGNOSIS — A419 Sepsis, unspecified organism: Secondary | ICD-10-CM | POA: Diagnosis present

## 2021-07-10 DIAGNOSIS — R059 Cough, unspecified: Secondary | ICD-10-CM | POA: Diagnosis not present

## 2021-07-10 DIAGNOSIS — E1165 Type 2 diabetes mellitus with hyperglycemia: Secondary | ICD-10-CM | POA: Diagnosis not present

## 2021-07-10 DIAGNOSIS — N170 Acute kidney failure with tubular necrosis: Secondary | ICD-10-CM | POA: Diagnosis not present

## 2021-07-10 DIAGNOSIS — J9601 Acute respiratory failure with hypoxia: Secondary | ICD-10-CM | POA: Diagnosis not present

## 2021-07-10 DIAGNOSIS — Z20822 Contact with and (suspected) exposure to covid-19: Secondary | ICD-10-CM | POA: Diagnosis present

## 2021-07-10 DIAGNOSIS — J9602 Acute respiratory failure with hypercapnia: Secondary | ICD-10-CM | POA: Diagnosis not present

## 2021-07-10 DIAGNOSIS — E871 Hypo-osmolality and hyponatremia: Secondary | ICD-10-CM | POA: Diagnosis present

## 2021-07-10 DIAGNOSIS — N3000 Acute cystitis without hematuria: Secondary | ICD-10-CM | POA: Diagnosis not present

## 2021-07-10 DIAGNOSIS — M6281 Muscle weakness (generalized): Secondary | ICD-10-CM | POA: Diagnosis not present

## 2021-07-10 DIAGNOSIS — J189 Pneumonia, unspecified organism: Secondary | ICD-10-CM | POA: Diagnosis not present

## 2021-07-10 DIAGNOSIS — M810 Age-related osteoporosis without current pathological fracture: Secondary | ICD-10-CM | POA: Diagnosis not present

## 2021-07-10 DIAGNOSIS — Z6834 Body mass index (BMI) 34.0-34.9, adult: Secondary | ICD-10-CM | POA: Diagnosis not present

## 2021-07-10 DIAGNOSIS — R110 Nausea: Secondary | ICD-10-CM | POA: Diagnosis not present

## 2021-07-10 DIAGNOSIS — B9620 Unspecified Escherichia coli [E. coli] as the cause of diseases classified elsewhere: Secondary | ICD-10-CM | POA: Diagnosis present

## 2021-07-10 DIAGNOSIS — Z23 Encounter for immunization: Secondary | ICD-10-CM | POA: Diagnosis not present

## 2021-07-10 DIAGNOSIS — R279 Unspecified lack of coordination: Secondary | ICD-10-CM | POA: Diagnosis not present

## 2021-07-10 DIAGNOSIS — J90 Pleural effusion, not elsewhere classified: Secondary | ICD-10-CM | POA: Diagnosis not present

## 2021-07-10 DIAGNOSIS — R202 Paresthesia of skin: Secondary | ICD-10-CM | POA: Diagnosis not present

## 2021-07-10 DIAGNOSIS — E1122 Type 2 diabetes mellitus with diabetic chronic kidney disease: Secondary | ICD-10-CM | POA: Diagnosis present

## 2021-07-10 DIAGNOSIS — R6521 Severe sepsis with septic shock: Secondary | ICD-10-CM | POA: Diagnosis present

## 2021-07-10 DIAGNOSIS — I129 Hypertensive chronic kidney disease with stage 1 through stage 4 chronic kidney disease, or unspecified chronic kidney disease: Secondary | ICD-10-CM | POA: Diagnosis present

## 2021-07-10 DIAGNOSIS — J9811 Atelectasis: Secondary | ICD-10-CM | POA: Diagnosis not present

## 2021-07-10 DIAGNOSIS — F05 Delirium due to known physiological condition: Secondary | ICD-10-CM | POA: Diagnosis not present

## 2021-07-10 DIAGNOSIS — R4182 Altered mental status, unspecified: Secondary | ICD-10-CM | POA: Diagnosis not present

## 2021-07-10 DIAGNOSIS — R300 Dysuria: Secondary | ICD-10-CM | POA: Diagnosis not present

## 2021-07-10 DIAGNOSIS — E875 Hyperkalemia: Secondary | ICD-10-CM | POA: Diagnosis present

## 2021-07-10 DIAGNOSIS — R8271 Bacteriuria: Secondary | ICD-10-CM | POA: Diagnosis not present

## 2021-07-10 DIAGNOSIS — K3189 Other diseases of stomach and duodenum: Secondary | ICD-10-CM | POA: Diagnosis not present

## 2021-07-10 DIAGNOSIS — M1990 Unspecified osteoarthritis, unspecified site: Secondary | ICD-10-CM | POA: Diagnosis present

## 2021-07-10 DIAGNOSIS — N179 Acute kidney failure, unspecified: Secondary | ICD-10-CM | POA: Diagnosis not present

## 2021-07-10 DIAGNOSIS — N390 Urinary tract infection, site not specified: Secondary | ICD-10-CM | POA: Diagnosis present

## 2021-07-10 DIAGNOSIS — E119 Type 2 diabetes mellitus without complications: Secondary | ICD-10-CM | POA: Diagnosis not present

## 2021-07-11 DIAGNOSIS — E1165 Type 2 diabetes mellitus with hyperglycemia: Secondary | ICD-10-CM | POA: Diagnosis not present

## 2021-07-11 DIAGNOSIS — R300 Dysuria: Secondary | ICD-10-CM | POA: Diagnosis not present

## 2021-07-13 DIAGNOSIS — D649 Anemia, unspecified: Secondary | ICD-10-CM | POA: Diagnosis not present

## 2021-07-13 DIAGNOSIS — E119 Type 2 diabetes mellitus without complications: Secondary | ICD-10-CM | POA: Diagnosis not present

## 2021-07-14 DIAGNOSIS — R109 Unspecified abdominal pain: Secondary | ICD-10-CM | POA: Diagnosis not present

## 2021-07-14 DIAGNOSIS — N390 Urinary tract infection, site not specified: Secondary | ICD-10-CM | POA: Diagnosis present

## 2021-07-14 DIAGNOSIS — R279 Unspecified lack of coordination: Secondary | ICD-10-CM | POA: Diagnosis not present

## 2021-07-14 DIAGNOSIS — K562 Volvulus: Secondary | ICD-10-CM | POA: Diagnosis not present

## 2021-07-14 DIAGNOSIS — N170 Acute kidney failure with tubular necrosis: Secondary | ICD-10-CM | POA: Diagnosis not present

## 2021-07-14 DIAGNOSIS — B9620 Unspecified Escherichia coli [E. coli] as the cause of diseases classified elsewhere: Secondary | ICD-10-CM | POA: Diagnosis present

## 2021-07-14 DIAGNOSIS — G928 Other toxic encephalopathy: Secondary | ICD-10-CM | POA: Diagnosis not present

## 2021-07-14 DIAGNOSIS — K660 Peritoneal adhesions (postprocedural) (postinfection): Secondary | ICD-10-CM | POA: Diagnosis not present

## 2021-07-14 DIAGNOSIS — N182 Chronic kidney disease, stage 2 (mild): Secondary | ICD-10-CM | POA: Diagnosis not present

## 2021-07-14 DIAGNOSIS — R6339 Other feeding difficulties: Secondary | ICD-10-CM | POA: Diagnosis not present

## 2021-07-14 DIAGNOSIS — J9811 Atelectasis: Secondary | ICD-10-CM | POA: Diagnosis not present

## 2021-07-14 DIAGNOSIS — R933 Abnormal findings on diagnostic imaging of other parts of digestive tract: Secondary | ICD-10-CM | POA: Diagnosis not present

## 2021-07-14 DIAGNOSIS — F05 Delirium due to known physiological condition: Secondary | ICD-10-CM | POA: Diagnosis not present

## 2021-07-14 DIAGNOSIS — N10 Acute tubulo-interstitial nephritis: Secondary | ICD-10-CM | POA: Diagnosis not present

## 2021-07-14 DIAGNOSIS — M810 Age-related osteoporosis without current pathological fracture: Secondary | ICD-10-CM | POA: Diagnosis not present

## 2021-07-14 DIAGNOSIS — J9691 Respiratory failure, unspecified with hypoxia: Secondary | ICD-10-CM | POA: Diagnosis not present

## 2021-07-14 DIAGNOSIS — M1990 Unspecified osteoarthritis, unspecified site: Secondary | ICD-10-CM | POA: Diagnosis present

## 2021-07-14 DIAGNOSIS — J811 Chronic pulmonary edema: Secondary | ICD-10-CM | POA: Diagnosis not present

## 2021-07-14 DIAGNOSIS — R110 Nausea: Secondary | ICD-10-CM | POA: Diagnosis not present

## 2021-07-14 DIAGNOSIS — D72829 Elevated white blood cell count, unspecified: Secondary | ICD-10-CM | POA: Diagnosis not present

## 2021-07-14 DIAGNOSIS — D509 Iron deficiency anemia, unspecified: Secondary | ICD-10-CM | POA: Diagnosis not present

## 2021-07-14 DIAGNOSIS — I959 Hypotension, unspecified: Secondary | ICD-10-CM | POA: Diagnosis not present

## 2021-07-14 DIAGNOSIS — J90 Pleural effusion, not elsewhere classified: Secondary | ICD-10-CM | POA: Diagnosis not present

## 2021-07-14 DIAGNOSIS — R5381 Other malaise: Secondary | ICD-10-CM | POA: Diagnosis not present

## 2021-07-14 DIAGNOSIS — N189 Chronic kidney disease, unspecified: Secondary | ICD-10-CM | POA: Diagnosis not present

## 2021-07-14 DIAGNOSIS — E785 Hyperlipidemia, unspecified: Secondary | ICD-10-CM | POA: Diagnosis present

## 2021-07-14 DIAGNOSIS — J9601 Acute respiratory failure with hypoxia: Secondary | ICD-10-CM | POA: Diagnosis not present

## 2021-07-14 DIAGNOSIS — R918 Other nonspecific abnormal finding of lung field: Secondary | ICD-10-CM | POA: Diagnosis not present

## 2021-07-14 DIAGNOSIS — E119 Type 2 diabetes mellitus without complications: Secondary | ICD-10-CM | POA: Diagnosis not present

## 2021-07-14 DIAGNOSIS — E8729 Other acidosis: Secondary | ICD-10-CM | POA: Diagnosis present

## 2021-07-14 DIAGNOSIS — R638 Other symptoms and signs concerning food and fluid intake: Secondary | ICD-10-CM | POA: Diagnosis not present

## 2021-07-14 DIAGNOSIS — Z9981 Dependence on supplemental oxygen: Secondary | ICD-10-CM | POA: Diagnosis not present

## 2021-07-14 DIAGNOSIS — Z931 Gastrostomy status: Secondary | ICD-10-CM | POA: Diagnosis not present

## 2021-07-14 DIAGNOSIS — B952 Enterococcus as the cause of diseases classified elsewhere: Secondary | ICD-10-CM | POA: Diagnosis present

## 2021-07-14 DIAGNOSIS — Z20822 Contact with and (suspected) exposure to covid-19: Secondary | ICD-10-CM | POA: Diagnosis present

## 2021-07-14 DIAGNOSIS — K3189 Other diseases of stomach and duodenum: Secondary | ICD-10-CM | POA: Diagnosis not present

## 2021-07-14 DIAGNOSIS — E875 Hyperkalemia: Secondary | ICD-10-CM | POA: Diagnosis present

## 2021-07-14 DIAGNOSIS — R410 Disorientation, unspecified: Secondary | ICD-10-CM | POA: Diagnosis not present

## 2021-07-14 DIAGNOSIS — R339 Retention of urine, unspecified: Secondary | ICD-10-CM | POA: Diagnosis not present

## 2021-07-14 DIAGNOSIS — K450 Other specified abdominal hernia with obstruction, without gangrene: Secondary | ICD-10-CM | POA: Diagnosis not present

## 2021-07-14 DIAGNOSIS — N3000 Acute cystitis without hematuria: Secondary | ICD-10-CM | POA: Diagnosis not present

## 2021-07-14 DIAGNOSIS — R6521 Severe sepsis with septic shock: Secondary | ICD-10-CM | POA: Diagnosis present

## 2021-07-14 DIAGNOSIS — G9341 Metabolic encephalopathy: Secondary | ICD-10-CM | POA: Diagnosis present

## 2021-07-14 DIAGNOSIS — Z452 Encounter for adjustment and management of vascular access device: Secondary | ICD-10-CM | POA: Diagnosis not present

## 2021-07-14 DIAGNOSIS — Z23 Encounter for immunization: Secondary | ICD-10-CM | POA: Diagnosis not present

## 2021-07-14 DIAGNOSIS — J189 Pneumonia, unspecified organism: Secondary | ICD-10-CM | POA: Diagnosis not present

## 2021-07-14 DIAGNOSIS — Z6834 Body mass index (BMI) 34.0-34.9, adult: Secondary | ICD-10-CM | POA: Diagnosis not present

## 2021-07-14 DIAGNOSIS — M6281 Muscle weakness (generalized): Secondary | ICD-10-CM | POA: Diagnosis not present

## 2021-07-14 DIAGNOSIS — E872 Acidosis, unspecified: Secondary | ICD-10-CM | POA: Diagnosis not present

## 2021-07-14 DIAGNOSIS — A419 Sepsis, unspecified organism: Secondary | ICD-10-CM | POA: Diagnosis present

## 2021-07-14 DIAGNOSIS — I10 Essential (primary) hypertension: Secondary | ICD-10-CM | POA: Diagnosis not present

## 2021-07-14 DIAGNOSIS — D649 Anemia, unspecified: Secondary | ICD-10-CM | POA: Diagnosis not present

## 2021-07-14 DIAGNOSIS — K5900 Constipation, unspecified: Secondary | ICD-10-CM | POA: Diagnosis not present

## 2021-07-14 DIAGNOSIS — N183 Chronic kidney disease, stage 3 unspecified: Secondary | ICD-10-CM | POA: Diagnosis present

## 2021-07-14 DIAGNOSIS — I071 Rheumatic tricuspid insufficiency: Secondary | ICD-10-CM | POA: Diagnosis not present

## 2021-07-14 DIAGNOSIS — R001 Bradycardia, unspecified: Secondary | ICD-10-CM | POA: Diagnosis not present

## 2021-07-14 DIAGNOSIS — Z4682 Encounter for fitting and adjustment of non-vascular catheter: Secondary | ICD-10-CM | POA: Diagnosis not present

## 2021-07-14 DIAGNOSIS — R4182 Altered mental status, unspecified: Secondary | ICD-10-CM | POA: Diagnosis not present

## 2021-07-14 DIAGNOSIS — R202 Paresthesia of skin: Secondary | ICD-10-CM | POA: Diagnosis not present

## 2021-07-14 DIAGNOSIS — R8271 Bacteriuria: Secondary | ICD-10-CM | POA: Diagnosis not present

## 2021-07-14 DIAGNOSIS — G8220 Paraplegia, unspecified: Secondary | ICD-10-CM | POA: Diagnosis not present

## 2021-07-14 DIAGNOSIS — N179 Acute kidney failure, unspecified: Secondary | ICD-10-CM | POA: Diagnosis not present

## 2021-07-14 DIAGNOSIS — J9602 Acute respiratory failure with hypercapnia: Secondary | ICD-10-CM | POA: Diagnosis not present

## 2021-07-14 DIAGNOSIS — E871 Hypo-osmolality and hyponatremia: Secondary | ICD-10-CM | POA: Diagnosis present

## 2021-07-14 DIAGNOSIS — K449 Diaphragmatic hernia without obstruction or gangrene: Secondary | ICD-10-CM | POA: Diagnosis present

## 2021-07-14 DIAGNOSIS — E1122 Type 2 diabetes mellitus with diabetic chronic kidney disease: Secondary | ICD-10-CM | POA: Diagnosis present

## 2021-07-14 DIAGNOSIS — R059 Cough, unspecified: Secondary | ICD-10-CM | POA: Diagnosis not present

## 2021-07-14 DIAGNOSIS — D539 Nutritional anemia, unspecified: Secondary | ICD-10-CM | POA: Diagnosis not present

## 2021-07-14 DIAGNOSIS — I129 Hypertensive chronic kidney disease with stage 1 through stage 4 chronic kidney disease, or unspecified chronic kidney disease: Secondary | ICD-10-CM | POA: Diagnosis present

## 2021-07-15 DIAGNOSIS — J9601 Acute respiratory failure with hypoxia: Secondary | ICD-10-CM | POA: Diagnosis not present

## 2021-07-15 DIAGNOSIS — A419 Sepsis, unspecified organism: Secondary | ICD-10-CM | POA: Diagnosis not present

## 2021-07-15 DIAGNOSIS — R6521 Severe sepsis with septic shock: Secondary | ICD-10-CM | POA: Diagnosis not present

## 2021-07-15 DIAGNOSIS — E872 Acidosis, unspecified: Secondary | ICD-10-CM | POA: Diagnosis not present

## 2021-07-28 DIAGNOSIS — Z7409 Other reduced mobility: Secondary | ICD-10-CM | POA: Diagnosis not present

## 2021-07-28 DIAGNOSIS — N179 Acute kidney failure, unspecified: Secondary | ICD-10-CM | POA: Diagnosis not present

## 2021-07-28 DIAGNOSIS — K449 Diaphragmatic hernia without obstruction or gangrene: Secondary | ICD-10-CM | POA: Diagnosis not present

## 2021-07-28 DIAGNOSIS — I10 Essential (primary) hypertension: Secondary | ICD-10-CM | POA: Diagnosis not present

## 2021-07-28 DIAGNOSIS — Z043 Encounter for examination and observation following other accident: Secondary | ICD-10-CM | POA: Diagnosis not present

## 2021-07-28 DIAGNOSIS — Z882 Allergy status to sulfonamides status: Secondary | ICD-10-CM | POA: Diagnosis not present

## 2021-07-28 DIAGNOSIS — R279 Unspecified lack of coordination: Secondary | ICD-10-CM | POA: Diagnosis not present

## 2021-07-28 DIAGNOSIS — J189 Pneumonia, unspecified organism: Secondary | ICD-10-CM | POA: Diagnosis not present

## 2021-07-28 DIAGNOSIS — D539 Nutritional anemia, unspecified: Secondary | ICD-10-CM | POA: Diagnosis not present

## 2021-07-28 DIAGNOSIS — A419 Sepsis, unspecified organism: Secondary | ICD-10-CM | POA: Diagnosis not present

## 2021-07-28 DIAGNOSIS — R9431 Abnormal electrocardiogram [ECG] [EKG]: Secondary | ICD-10-CM | POA: Diagnosis not present

## 2021-07-28 DIAGNOSIS — E119 Type 2 diabetes mellitus without complications: Secondary | ICD-10-CM | POA: Diagnosis not present

## 2021-07-28 DIAGNOSIS — Z90722 Acquired absence of ovaries, bilateral: Secondary | ICD-10-CM | POA: Diagnosis not present

## 2021-07-28 DIAGNOSIS — E559 Vitamin D deficiency, unspecified: Secondary | ICD-10-CM | POA: Diagnosis not present

## 2021-07-28 DIAGNOSIS — B9620 Unspecified Escherichia coli [E. coli] as the cause of diseases classified elsewhere: Secondary | ICD-10-CM | POA: Diagnosis not present

## 2021-07-28 DIAGNOSIS — R001 Bradycardia, unspecified: Secondary | ICD-10-CM | POA: Diagnosis not present

## 2021-07-28 DIAGNOSIS — E1165 Type 2 diabetes mellitus with hyperglycemia: Secondary | ICD-10-CM | POA: Diagnosis not present

## 2021-07-28 DIAGNOSIS — N10 Acute tubulo-interstitial nephritis: Secondary | ICD-10-CM | POA: Diagnosis not present

## 2021-07-28 DIAGNOSIS — D649 Anemia, unspecified: Secondary | ICD-10-CM | POA: Diagnosis not present

## 2021-07-28 DIAGNOSIS — W19XXXA Unspecified fall, initial encounter: Secondary | ICD-10-CM | POA: Diagnosis not present

## 2021-07-28 DIAGNOSIS — R2231 Localized swelling, mass and lump, right upper limb: Secondary | ICD-10-CM | POA: Diagnosis not present

## 2021-07-28 DIAGNOSIS — R059 Cough, unspecified: Secondary | ICD-10-CM | POA: Diagnosis not present

## 2021-07-28 DIAGNOSIS — I6782 Cerebral ischemia: Secondary | ICD-10-CM | POA: Diagnosis not present

## 2021-07-28 DIAGNOSIS — M6281 Muscle weakness (generalized): Secondary | ICD-10-CM | POA: Diagnosis not present

## 2021-07-28 DIAGNOSIS — S0990XA Unspecified injury of head, initial encounter: Secondary | ICD-10-CM | POA: Diagnosis not present

## 2021-07-28 DIAGNOSIS — R5381 Other malaise: Secondary | ICD-10-CM | POA: Diagnosis not present

## 2021-07-28 DIAGNOSIS — N3000 Acute cystitis without hematuria: Secondary | ICD-10-CM | POA: Diagnosis not present

## 2021-07-28 DIAGNOSIS — Z0000 Encounter for general adult medical examination without abnormal findings: Secondary | ICD-10-CM | POA: Diagnosis not present

## 2021-07-28 DIAGNOSIS — M5030 Other cervical disc degeneration, unspecified cervical region: Secondary | ICD-10-CM | POA: Diagnosis not present

## 2021-07-28 DIAGNOSIS — S0081XA Abrasion of other part of head, initial encounter: Secondary | ICD-10-CM | POA: Diagnosis not present

## 2021-07-28 DIAGNOSIS — R410 Disorientation, unspecified: Secondary | ICD-10-CM | POA: Diagnosis not present

## 2021-07-28 DIAGNOSIS — R339 Retention of urine, unspecified: Secondary | ICD-10-CM | POA: Diagnosis not present

## 2021-07-28 DIAGNOSIS — R630 Anorexia: Secondary | ICD-10-CM | POA: Diagnosis not present

## 2021-07-28 DIAGNOSIS — M19041 Primary osteoarthritis, right hand: Secondary | ICD-10-CM | POA: Diagnosis not present

## 2021-07-28 DIAGNOSIS — N390 Urinary tract infection, site not specified: Secondary | ICD-10-CM | POA: Diagnosis not present

## 2021-07-28 DIAGNOSIS — Z9849 Cataract extraction status, unspecified eye: Secondary | ICD-10-CM | POA: Diagnosis not present

## 2021-07-28 DIAGNOSIS — N183 Chronic kidney disease, stage 3 unspecified: Secondary | ICD-10-CM | POA: Diagnosis not present

## 2021-07-28 DIAGNOSIS — G9341 Metabolic encephalopathy: Secondary | ICD-10-CM | POA: Diagnosis not present

## 2021-07-28 DIAGNOSIS — K450 Other specified abdominal hernia with obstruction, without gangrene: Secondary | ICD-10-CM | POA: Diagnosis not present

## 2021-07-28 DIAGNOSIS — Z886 Allergy status to analgesic agent status: Secondary | ICD-10-CM | POA: Diagnosis not present

## 2021-07-28 DIAGNOSIS — N3090 Cystitis, unspecified without hematuria: Secondary | ICD-10-CM | POA: Diagnosis not present

## 2021-07-28 DIAGNOSIS — Z931 Gastrostomy status: Secondary | ICD-10-CM | POA: Diagnosis not present

## 2021-07-28 DIAGNOSIS — I440 Atrioventricular block, first degree: Secondary | ICD-10-CM | POA: Diagnosis not present

## 2021-07-28 DIAGNOSIS — J9811 Atelectasis: Secondary | ICD-10-CM | POA: Diagnosis not present

## 2021-07-28 DIAGNOSIS — M79641 Pain in right hand: Secondary | ICD-10-CM | POA: Diagnosis not present

## 2021-07-28 DIAGNOSIS — S0083XA Contusion of other part of head, initial encounter: Secondary | ICD-10-CM | POA: Diagnosis not present

## 2021-07-28 DIAGNOSIS — Z888 Allergy status to other drugs, medicaments and biological substances status: Secondary | ICD-10-CM | POA: Diagnosis not present

## 2021-07-28 DIAGNOSIS — Z90710 Acquired absence of both cervix and uterus: Secondary | ICD-10-CM | POA: Diagnosis not present

## 2021-07-28 DIAGNOSIS — R140 Abdominal distension (gaseous): Secondary | ICD-10-CM | POA: Diagnosis not present

## 2021-07-28 DIAGNOSIS — R54 Age-related physical debility: Secondary | ICD-10-CM | POA: Diagnosis not present

## 2021-07-28 DIAGNOSIS — Z431 Encounter for attention to gastrostomy: Secondary | ICD-10-CM | POA: Diagnosis not present

## 2021-07-28 DIAGNOSIS — R609 Edema, unspecified: Secondary | ICD-10-CM | POA: Diagnosis not present

## 2021-07-28 DIAGNOSIS — Z7984 Long term (current) use of oral hypoglycemic drugs: Secondary | ICD-10-CM | POA: Diagnosis not present

## 2021-07-28 DIAGNOSIS — R6521 Severe sepsis with septic shock: Secondary | ICD-10-CM | POA: Diagnosis not present

## 2021-07-28 DIAGNOSIS — I959 Hypotension, unspecified: Secondary | ICD-10-CM | POA: Diagnosis not present

## 2021-07-28 DIAGNOSIS — Z8542 Personal history of malignant neoplasm of other parts of uterus: Secondary | ICD-10-CM | POA: Diagnosis not present

## 2021-07-28 DIAGNOSIS — M47812 Spondylosis without myelopathy or radiculopathy, cervical region: Secondary | ICD-10-CM | POA: Diagnosis not present

## 2021-07-28 DIAGNOSIS — E875 Hyperkalemia: Secondary | ICD-10-CM | POA: Diagnosis not present

## 2021-07-28 DIAGNOSIS — Z79899 Other long term (current) drug therapy: Secondary | ICD-10-CM | POA: Diagnosis not present

## 2021-07-28 DIAGNOSIS — D509 Iron deficiency anemia, unspecified: Secondary | ICD-10-CM | POA: Diagnosis not present

## 2021-07-28 DIAGNOSIS — R4182 Altered mental status, unspecified: Secondary | ICD-10-CM | POA: Diagnosis not present

## 2021-07-28 DIAGNOSIS — R220 Localized swelling, mass and lump, head: Secondary | ICD-10-CM | POA: Diagnosis not present

## 2021-07-28 DIAGNOSIS — N189 Chronic kidney disease, unspecified: Secondary | ICD-10-CM | POA: Diagnosis not present

## 2021-07-28 DIAGNOSIS — M8588 Other specified disorders of bone density and structure, other site: Secondary | ICD-10-CM | POA: Diagnosis not present

## 2021-07-28 DIAGNOSIS — R300 Dysuria: Secondary | ICD-10-CM | POA: Diagnosis not present

## 2021-07-28 DIAGNOSIS — E785 Hyperlipidemia, unspecified: Secondary | ICD-10-CM | POA: Diagnosis not present

## 2021-07-28 DIAGNOSIS — J9691 Respiratory failure, unspecified with hypoxia: Secondary | ICD-10-CM | POA: Diagnosis not present

## 2021-07-28 DIAGNOSIS — R109 Unspecified abdominal pain: Secondary | ICD-10-CM | POA: Diagnosis not present

## 2021-07-31 DIAGNOSIS — R4182 Altered mental status, unspecified: Secondary | ICD-10-CM | POA: Diagnosis not present

## 2021-07-31 DIAGNOSIS — N390 Urinary tract infection, site not specified: Secondary | ICD-10-CM | POA: Diagnosis not present

## 2021-07-31 DIAGNOSIS — R339 Retention of urine, unspecified: Secondary | ICD-10-CM | POA: Diagnosis not present

## 2021-07-31 DIAGNOSIS — R410 Disorientation, unspecified: Secondary | ICD-10-CM | POA: Diagnosis not present

## 2021-07-31 DIAGNOSIS — R6521 Severe sepsis with septic shock: Secondary | ICD-10-CM | POA: Diagnosis not present

## 2021-08-01 DIAGNOSIS — E559 Vitamin D deficiency, unspecified: Secondary | ICD-10-CM | POA: Diagnosis not present

## 2021-08-01 DIAGNOSIS — D649 Anemia, unspecified: Secondary | ICD-10-CM | POA: Diagnosis not present

## 2021-08-01 DIAGNOSIS — E119 Type 2 diabetes mellitus without complications: Secondary | ICD-10-CM | POA: Diagnosis not present

## 2021-08-02 DIAGNOSIS — R140 Abdominal distension (gaseous): Secondary | ICD-10-CM | POA: Diagnosis not present

## 2021-08-02 DIAGNOSIS — R109 Unspecified abdominal pain: Secondary | ICD-10-CM | POA: Diagnosis not present

## 2021-08-07 DIAGNOSIS — R109 Unspecified abdominal pain: Secondary | ICD-10-CM | POA: Diagnosis not present

## 2021-08-07 DIAGNOSIS — N390 Urinary tract infection, site not specified: Secondary | ICD-10-CM | POA: Diagnosis not present

## 2021-08-07 DIAGNOSIS — R339 Retention of urine, unspecified: Secondary | ICD-10-CM | POA: Diagnosis not present

## 2021-08-07 DIAGNOSIS — Z931 Gastrostomy status: Secondary | ICD-10-CM | POA: Diagnosis not present

## 2021-08-07 DIAGNOSIS — R6521 Severe sepsis with septic shock: Secondary | ICD-10-CM | POA: Diagnosis not present

## 2021-08-08 DIAGNOSIS — N390 Urinary tract infection, site not specified: Secondary | ICD-10-CM | POA: Diagnosis not present

## 2021-08-08 DIAGNOSIS — E1165 Type 2 diabetes mellitus with hyperglycemia: Secondary | ICD-10-CM | POA: Diagnosis not present

## 2021-08-08 DIAGNOSIS — R300 Dysuria: Secondary | ICD-10-CM | POA: Diagnosis not present

## 2021-08-09 DIAGNOSIS — R109 Unspecified abdominal pain: Secondary | ICD-10-CM | POA: Diagnosis not present

## 2021-08-09 DIAGNOSIS — N390 Urinary tract infection, site not specified: Secondary | ICD-10-CM | POA: Diagnosis not present

## 2021-08-09 DIAGNOSIS — Z431 Encounter for attention to gastrostomy: Secondary | ICD-10-CM | POA: Diagnosis not present

## 2021-08-09 DIAGNOSIS — R339 Retention of urine, unspecified: Secondary | ICD-10-CM | POA: Diagnosis not present

## 2021-08-10 DIAGNOSIS — Z90710 Acquired absence of both cervix and uterus: Secondary | ICD-10-CM | POA: Diagnosis not present

## 2021-08-10 DIAGNOSIS — Z886 Allergy status to analgesic agent status: Secondary | ICD-10-CM | POA: Diagnosis not present

## 2021-08-10 DIAGNOSIS — M47812 Spondylosis without myelopathy or radiculopathy, cervical region: Secondary | ICD-10-CM | POA: Diagnosis not present

## 2021-08-10 DIAGNOSIS — Z931 Gastrostomy status: Secondary | ICD-10-CM | POA: Diagnosis not present

## 2021-08-10 DIAGNOSIS — Z90722 Acquired absence of ovaries, bilateral: Secondary | ICD-10-CM | POA: Diagnosis not present

## 2021-08-10 DIAGNOSIS — R339 Retention of urine, unspecified: Secondary | ICD-10-CM | POA: Diagnosis not present

## 2021-08-10 DIAGNOSIS — Z9849 Cataract extraction status, unspecified eye: Secondary | ICD-10-CM | POA: Diagnosis not present

## 2021-08-10 DIAGNOSIS — J189 Pneumonia, unspecified organism: Secondary | ICD-10-CM | POA: Diagnosis not present

## 2021-08-10 DIAGNOSIS — S0990XA Unspecified injury of head, initial encounter: Secondary | ICD-10-CM | POA: Diagnosis not present

## 2021-08-10 DIAGNOSIS — M8588 Other specified disorders of bone density and structure, other site: Secondary | ICD-10-CM | POA: Diagnosis not present

## 2021-08-10 DIAGNOSIS — N3000 Acute cystitis without hematuria: Secondary | ICD-10-CM | POA: Diagnosis not present

## 2021-08-10 DIAGNOSIS — I959 Hypotension, unspecified: Secondary | ICD-10-CM | POA: Diagnosis not present

## 2021-08-10 DIAGNOSIS — I440 Atrioventricular block, first degree: Secondary | ICD-10-CM | POA: Diagnosis not present

## 2021-08-10 DIAGNOSIS — E119 Type 2 diabetes mellitus without complications: Secondary | ICD-10-CM | POA: Diagnosis not present

## 2021-08-10 DIAGNOSIS — E785 Hyperlipidemia, unspecified: Secondary | ICD-10-CM | POA: Diagnosis not present

## 2021-08-10 DIAGNOSIS — M5030 Other cervical disc degeneration, unspecified cervical region: Secondary | ICD-10-CM | POA: Diagnosis not present

## 2021-08-10 DIAGNOSIS — Z0000 Encounter for general adult medical examination without abnormal findings: Secondary | ICD-10-CM | POA: Diagnosis not present

## 2021-08-10 DIAGNOSIS — R630 Anorexia: Secondary | ICD-10-CM | POA: Diagnosis not present

## 2021-08-10 DIAGNOSIS — Z043 Encounter for examination and observation following other accident: Secondary | ICD-10-CM | POA: Diagnosis not present

## 2021-08-10 DIAGNOSIS — W19XXXA Unspecified fall, initial encounter: Secondary | ICD-10-CM | POA: Diagnosis not present

## 2021-08-10 DIAGNOSIS — N3090 Cystitis, unspecified without hematuria: Secondary | ICD-10-CM | POA: Diagnosis not present

## 2021-08-10 DIAGNOSIS — Z882 Allergy status to sulfonamides status: Secondary | ICD-10-CM | POA: Diagnosis not present

## 2021-08-10 DIAGNOSIS — B9620 Unspecified Escherichia coli [E. coli] as the cause of diseases classified elsewhere: Secondary | ICD-10-CM | POA: Diagnosis not present

## 2021-08-10 DIAGNOSIS — Z79899 Other long term (current) drug therapy: Secondary | ICD-10-CM | POA: Diagnosis not present

## 2021-08-10 DIAGNOSIS — I6782 Cerebral ischemia: Secondary | ICD-10-CM | POA: Diagnosis not present

## 2021-08-10 DIAGNOSIS — R54 Age-related physical debility: Secondary | ICD-10-CM | POA: Diagnosis not present

## 2021-08-10 DIAGNOSIS — R9431 Abnormal electrocardiogram [ECG] [EKG]: Secondary | ICD-10-CM | POA: Diagnosis not present

## 2021-08-10 DIAGNOSIS — Z7409 Other reduced mobility: Secondary | ICD-10-CM | POA: Diagnosis not present

## 2021-08-10 DIAGNOSIS — R609 Edema, unspecified: Secondary | ICD-10-CM | POA: Diagnosis not present

## 2021-08-10 DIAGNOSIS — R279 Unspecified lack of coordination: Secondary | ICD-10-CM | POA: Diagnosis not present

## 2021-08-10 DIAGNOSIS — R410 Disorientation, unspecified: Secondary | ICD-10-CM | POA: Diagnosis not present

## 2021-08-10 DIAGNOSIS — R4182 Altered mental status, unspecified: Secondary | ICD-10-CM | POA: Diagnosis not present

## 2021-08-10 DIAGNOSIS — A419 Sepsis, unspecified organism: Secondary | ICD-10-CM | POA: Diagnosis not present

## 2021-08-10 DIAGNOSIS — J9811 Atelectasis: Secondary | ICD-10-CM | POA: Diagnosis not present

## 2021-08-10 DIAGNOSIS — D649 Anemia, unspecified: Secondary | ICD-10-CM | POA: Diagnosis not present

## 2021-08-10 DIAGNOSIS — N10 Acute tubulo-interstitial nephritis: Secondary | ICD-10-CM | POA: Diagnosis not present

## 2021-08-10 DIAGNOSIS — M19041 Primary osteoarthritis, right hand: Secondary | ICD-10-CM | POA: Diagnosis not present

## 2021-08-10 DIAGNOSIS — R059 Cough, unspecified: Secondary | ICD-10-CM | POA: Diagnosis not present

## 2021-08-10 DIAGNOSIS — J9691 Respiratory failure, unspecified with hypoxia: Secondary | ICD-10-CM | POA: Diagnosis not present

## 2021-08-10 DIAGNOSIS — N189 Chronic kidney disease, unspecified: Secondary | ICD-10-CM | POA: Diagnosis not present

## 2021-08-10 DIAGNOSIS — R001 Bradycardia, unspecified: Secondary | ICD-10-CM | POA: Diagnosis not present

## 2021-08-10 DIAGNOSIS — R220 Localized swelling, mass and lump, head: Secondary | ICD-10-CM | POA: Diagnosis not present

## 2021-08-10 DIAGNOSIS — M6281 Muscle weakness (generalized): Secondary | ICD-10-CM | POA: Diagnosis not present

## 2021-08-10 DIAGNOSIS — K449 Diaphragmatic hernia without obstruction or gangrene: Secondary | ICD-10-CM | POA: Diagnosis not present

## 2021-08-10 DIAGNOSIS — I10 Essential (primary) hypertension: Secondary | ICD-10-CM | POA: Diagnosis not present

## 2021-08-10 DIAGNOSIS — D509 Iron deficiency anemia, unspecified: Secondary | ICD-10-CM | POA: Diagnosis not present

## 2021-08-10 DIAGNOSIS — S0083XA Contusion of other part of head, initial encounter: Secondary | ICD-10-CM | POA: Diagnosis not present

## 2021-08-10 DIAGNOSIS — G9341 Metabolic encephalopathy: Secondary | ICD-10-CM | POA: Diagnosis not present

## 2021-08-10 DIAGNOSIS — Z888 Allergy status to other drugs, medicaments and biological substances status: Secondary | ICD-10-CM | POA: Diagnosis not present

## 2021-08-10 DIAGNOSIS — M79641 Pain in right hand: Secondary | ICD-10-CM | POA: Diagnosis not present

## 2021-08-10 DIAGNOSIS — N183 Chronic kidney disease, stage 3 unspecified: Secondary | ICD-10-CM | POA: Diagnosis not present

## 2021-08-10 DIAGNOSIS — R5381 Other malaise: Secondary | ICD-10-CM | POA: Diagnosis not present

## 2021-08-10 DIAGNOSIS — N390 Urinary tract infection, site not specified: Secondary | ICD-10-CM | POA: Diagnosis not present

## 2021-08-10 DIAGNOSIS — Z8542 Personal history of malignant neoplasm of other parts of uterus: Secondary | ICD-10-CM | POA: Diagnosis not present

## 2021-08-10 DIAGNOSIS — E875 Hyperkalemia: Secondary | ICD-10-CM | POA: Diagnosis not present

## 2021-08-10 DIAGNOSIS — S0081XA Abrasion of other part of head, initial encounter: Secondary | ICD-10-CM | POA: Diagnosis not present

## 2021-08-10 DIAGNOSIS — Z7984 Long term (current) use of oral hypoglycemic drugs: Secondary | ICD-10-CM | POA: Diagnosis not present

## 2021-08-10 DIAGNOSIS — K450 Other specified abdominal hernia with obstruction, without gangrene: Secondary | ICD-10-CM | POA: Diagnosis not present

## 2021-08-10 DIAGNOSIS — R2231 Localized swelling, mass and lump, right upper limb: Secondary | ICD-10-CM | POA: Diagnosis not present

## 2021-08-18 DIAGNOSIS — R410 Disorientation, unspecified: Secondary | ICD-10-CM | POA: Diagnosis not present

## 2021-08-18 DIAGNOSIS — Z882 Allergy status to sulfonamides status: Secondary | ICD-10-CM | POA: Diagnosis not present

## 2021-08-18 DIAGNOSIS — I959 Hypotension, unspecified: Secondary | ICD-10-CM | POA: Diagnosis not present

## 2021-08-18 DIAGNOSIS — Z7984 Long term (current) use of oral hypoglycemic drugs: Secondary | ICD-10-CM | POA: Diagnosis not present

## 2021-08-18 DIAGNOSIS — B9620 Unspecified Escherichia coli [E. coli] as the cause of diseases classified elsewhere: Secondary | ICD-10-CM | POA: Diagnosis not present

## 2021-08-18 DIAGNOSIS — R339 Retention of urine, unspecified: Secondary | ICD-10-CM | POA: Diagnosis not present

## 2021-08-18 DIAGNOSIS — N3090 Cystitis, unspecified without hematuria: Secondary | ICD-10-CM | POA: Diagnosis not present

## 2021-08-18 DIAGNOSIS — N390 Urinary tract infection, site not specified: Secondary | ICD-10-CM | POA: Diagnosis not present

## 2021-08-18 DIAGNOSIS — E785 Hyperlipidemia, unspecified: Secondary | ICD-10-CM | POA: Diagnosis not present

## 2021-08-18 DIAGNOSIS — E119 Type 2 diabetes mellitus without complications: Secondary | ICD-10-CM | POA: Diagnosis not present

## 2021-08-18 DIAGNOSIS — I10 Essential (primary) hypertension: Secondary | ICD-10-CM | POA: Diagnosis not present

## 2021-08-22 DIAGNOSIS — R609 Edema, unspecified: Secondary | ICD-10-CM | POA: Diagnosis not present

## 2021-08-22 DIAGNOSIS — R410 Disorientation, unspecified: Secondary | ICD-10-CM | POA: Diagnosis not present

## 2021-08-28 DIAGNOSIS — R5381 Other malaise: Secondary | ICD-10-CM | POA: Diagnosis not present

## 2021-08-28 DIAGNOSIS — K449 Diaphragmatic hernia without obstruction or gangrene: Secondary | ICD-10-CM | POA: Diagnosis not present

## 2021-08-28 DIAGNOSIS — R630 Anorexia: Secondary | ICD-10-CM | POA: Diagnosis not present

## 2021-08-28 DIAGNOSIS — Z7409 Other reduced mobility: Secondary | ICD-10-CM | POA: Diagnosis not present

## 2021-08-28 DIAGNOSIS — R059 Cough, unspecified: Secondary | ICD-10-CM | POA: Diagnosis not present

## 2021-08-28 DIAGNOSIS — Z931 Gastrostomy status: Secondary | ICD-10-CM | POA: Diagnosis not present

## 2021-08-28 DIAGNOSIS — R54 Age-related physical debility: Secondary | ICD-10-CM | POA: Diagnosis not present

## 2021-08-29 DIAGNOSIS — R059 Cough, unspecified: Secondary | ICD-10-CM | POA: Diagnosis not present

## 2021-08-31 DIAGNOSIS — J9811 Atelectasis: Secondary | ICD-10-CM | POA: Diagnosis not present

## 2021-08-31 DIAGNOSIS — R4182 Altered mental status, unspecified: Secondary | ICD-10-CM | POA: Diagnosis not present

## 2021-08-31 DIAGNOSIS — Z7984 Long term (current) use of oral hypoglycemic drugs: Secondary | ICD-10-CM | POA: Diagnosis not present

## 2021-08-31 DIAGNOSIS — R9431 Abnormal electrocardiogram [ECG] [EKG]: Secondary | ICD-10-CM | POA: Diagnosis not present

## 2021-08-31 DIAGNOSIS — Z9849 Cataract extraction status, unspecified eye: Secondary | ICD-10-CM | POA: Diagnosis not present

## 2021-08-31 DIAGNOSIS — Z90722 Acquired absence of ovaries, bilateral: Secondary | ICD-10-CM | POA: Diagnosis not present

## 2021-08-31 DIAGNOSIS — Z79899 Other long term (current) drug therapy: Secondary | ICD-10-CM | POA: Diagnosis not present

## 2021-08-31 DIAGNOSIS — M5030 Other cervical disc degeneration, unspecified cervical region: Secondary | ICD-10-CM | POA: Diagnosis not present

## 2021-08-31 DIAGNOSIS — Z931 Gastrostomy status: Secondary | ICD-10-CM | POA: Diagnosis not present

## 2021-08-31 DIAGNOSIS — E119 Type 2 diabetes mellitus without complications: Secondary | ICD-10-CM | POA: Diagnosis not present

## 2021-08-31 DIAGNOSIS — S0990XA Unspecified injury of head, initial encounter: Secondary | ICD-10-CM | POA: Diagnosis not present

## 2021-08-31 DIAGNOSIS — I10 Essential (primary) hypertension: Secondary | ICD-10-CM | POA: Diagnosis not present

## 2021-08-31 DIAGNOSIS — M47812 Spondylosis without myelopathy or radiculopathy, cervical region: Secondary | ICD-10-CM | POA: Diagnosis not present

## 2021-08-31 DIAGNOSIS — Z90710 Acquired absence of both cervix and uterus: Secondary | ICD-10-CM | POA: Diagnosis not present

## 2021-08-31 DIAGNOSIS — Z886 Allergy status to analgesic agent status: Secondary | ICD-10-CM | POA: Diagnosis not present

## 2021-08-31 DIAGNOSIS — Z8542 Personal history of malignant neoplasm of other parts of uterus: Secondary | ICD-10-CM | POA: Diagnosis not present

## 2021-08-31 DIAGNOSIS — Z043 Encounter for examination and observation following other accident: Secondary | ICD-10-CM | POA: Diagnosis not present

## 2021-08-31 DIAGNOSIS — S0081XA Abrasion of other part of head, initial encounter: Secondary | ICD-10-CM | POA: Diagnosis not present

## 2021-08-31 DIAGNOSIS — S0083XA Contusion of other part of head, initial encounter: Secondary | ICD-10-CM | POA: Diagnosis not present

## 2021-08-31 DIAGNOSIS — I440 Atrioventricular block, first degree: Secondary | ICD-10-CM | POA: Diagnosis not present

## 2021-08-31 DIAGNOSIS — Z882 Allergy status to sulfonamides status: Secondary | ICD-10-CM | POA: Diagnosis not present

## 2021-08-31 DIAGNOSIS — I6782 Cerebral ischemia: Secondary | ICD-10-CM | POA: Diagnosis not present

## 2021-08-31 DIAGNOSIS — M8588 Other specified disorders of bone density and structure, other site: Secondary | ICD-10-CM | POA: Diagnosis not present

## 2021-08-31 DIAGNOSIS — R220 Localized swelling, mass and lump, head: Secondary | ICD-10-CM | POA: Diagnosis not present

## 2021-08-31 DIAGNOSIS — Z888 Allergy status to other drugs, medicaments and biological substances status: Secondary | ICD-10-CM | POA: Diagnosis not present

## 2021-08-31 DIAGNOSIS — D649 Anemia, unspecified: Secondary | ICD-10-CM | POA: Diagnosis not present

## 2021-08-31 DIAGNOSIS — N3000 Acute cystitis without hematuria: Secondary | ICD-10-CM | POA: Diagnosis not present

## 2021-09-09 DIAGNOSIS — M6259 Muscle wasting and atrophy, not elsewhere classified, multiple sites: Secondary | ICD-10-CM | POA: Diagnosis not present

## 2021-09-09 DIAGNOSIS — N183 Chronic kidney disease, stage 3 unspecified: Secondary | ICD-10-CM | POA: Diagnosis not present

## 2021-09-09 DIAGNOSIS — F339 Major depressive disorder, recurrent, unspecified: Secondary | ICD-10-CM | POA: Diagnosis not present

## 2021-09-09 DIAGNOSIS — Z466 Encounter for fitting and adjustment of urinary device: Secondary | ICD-10-CM | POA: Diagnosis not present

## 2021-09-09 DIAGNOSIS — E569 Vitamin deficiency, unspecified: Secondary | ICD-10-CM | POA: Diagnosis not present

## 2021-09-09 DIAGNOSIS — D509 Iron deficiency anemia, unspecified: Secondary | ICD-10-CM | POA: Diagnosis not present

## 2021-09-09 DIAGNOSIS — K219 Gastro-esophageal reflux disease without esophagitis: Secondary | ICD-10-CM | POA: Diagnosis not present

## 2021-09-09 DIAGNOSIS — J9691 Respiratory failure, unspecified with hypoxia: Secondary | ICD-10-CM | POA: Diagnosis not present

## 2021-09-09 DIAGNOSIS — M545 Low back pain, unspecified: Secondary | ICD-10-CM | POA: Diagnosis not present

## 2021-09-09 DIAGNOSIS — G4700 Insomnia, unspecified: Secondary | ICD-10-CM | POA: Diagnosis not present

## 2021-09-09 DIAGNOSIS — Z794 Long term (current) use of insulin: Secondary | ICD-10-CM | POA: Diagnosis not present

## 2021-09-09 DIAGNOSIS — E1122 Type 2 diabetes mellitus with diabetic chronic kidney disease: Secondary | ICD-10-CM | POA: Diagnosis not present

## 2021-09-09 DIAGNOSIS — Z7984 Long term (current) use of oral hypoglycemic drugs: Secondary | ICD-10-CM | POA: Diagnosis not present

## 2021-09-09 DIAGNOSIS — K450 Other specified abdominal hernia with obstruction, without gangrene: Secondary | ICD-10-CM | POA: Diagnosis not present

## 2021-09-09 DIAGNOSIS — J302 Other seasonal allergic rhinitis: Secondary | ICD-10-CM | POA: Diagnosis not present

## 2021-09-09 DIAGNOSIS — I129 Hypertensive chronic kidney disease with stage 1 through stage 4 chronic kidney disease, or unspecified chronic kidney disease: Secondary | ICD-10-CM | POA: Diagnosis not present

## 2021-09-09 DIAGNOSIS — K5900 Constipation, unspecified: Secondary | ICD-10-CM | POA: Diagnosis not present

## 2021-09-09 DIAGNOSIS — E785 Hyperlipidemia, unspecified: Secondary | ICD-10-CM | POA: Diagnosis not present

## 2021-09-09 DIAGNOSIS — D510 Vitamin B12 deficiency anemia due to intrinsic factor deficiency: Secondary | ICD-10-CM | POA: Diagnosis not present

## 2021-09-09 DIAGNOSIS — G9341 Metabolic encephalopathy: Secondary | ICD-10-CM | POA: Diagnosis not present

## 2021-09-09 DIAGNOSIS — G8220 Paraplegia, unspecified: Secondary | ICD-10-CM | POA: Diagnosis not present

## 2021-09-09 DIAGNOSIS — G8929 Other chronic pain: Secondary | ICD-10-CM | POA: Diagnosis not present

## 2021-09-09 DIAGNOSIS — N10 Acute tubulo-interstitial nephritis: Secondary | ICD-10-CM | POA: Diagnosis not present

## 2021-09-09 DIAGNOSIS — L309 Dermatitis, unspecified: Secondary | ICD-10-CM | POA: Diagnosis not present

## 2021-09-09 DIAGNOSIS — M810 Age-related osteoporosis without current pathological fracture: Secondary | ICD-10-CM | POA: Diagnosis not present

## 2021-09-13 DIAGNOSIS — I129 Hypertensive chronic kidney disease with stage 1 through stage 4 chronic kidney disease, or unspecified chronic kidney disease: Secondary | ICD-10-CM | POA: Diagnosis not present

## 2021-09-13 DIAGNOSIS — N10 Acute tubulo-interstitial nephritis: Secondary | ICD-10-CM | POA: Diagnosis not present

## 2021-09-13 DIAGNOSIS — Z466 Encounter for fitting and adjustment of urinary device: Secondary | ICD-10-CM | POA: Diagnosis not present

## 2021-09-13 DIAGNOSIS — N183 Chronic kidney disease, stage 3 unspecified: Secondary | ICD-10-CM | POA: Diagnosis not present

## 2021-09-13 DIAGNOSIS — E1122 Type 2 diabetes mellitus with diabetic chronic kidney disease: Secondary | ICD-10-CM | POA: Diagnosis not present

## 2021-09-13 DIAGNOSIS — G9341 Metabolic encephalopathy: Secondary | ICD-10-CM | POA: Diagnosis not present

## 2021-09-15 ENCOUNTER — Other Ambulatory Visit: Payer: Self-pay

## 2021-09-15 ENCOUNTER — Telehealth: Payer: Self-pay | Admitting: Family Medicine

## 2021-09-15 ENCOUNTER — Ambulatory Visit (INDEPENDENT_AMBULATORY_CARE_PROVIDER_SITE_OTHER): Payer: Medicare Other | Admitting: Family Medicine

## 2021-09-15 ENCOUNTER — Telehealth: Payer: Self-pay

## 2021-09-15 VITALS — BP 130/100 | HR 68 | Temp 98.4°F | Wt 145.0 lb

## 2021-09-15 DIAGNOSIS — I129 Hypertensive chronic kidney disease with stage 1 through stage 4 chronic kidney disease, or unspecified chronic kidney disease: Secondary | ICD-10-CM | POA: Diagnosis not present

## 2021-09-15 DIAGNOSIS — E860 Dehydration: Secondary | ICD-10-CM

## 2021-09-15 DIAGNOSIS — E1122 Type 2 diabetes mellitus with diabetic chronic kidney disease: Secondary | ICD-10-CM | POA: Diagnosis not present

## 2021-09-15 DIAGNOSIS — E118 Type 2 diabetes mellitus with unspecified complications: Secondary | ICD-10-CM

## 2021-09-15 DIAGNOSIS — Z09 Encounter for follow-up examination after completed treatment for conditions other than malignant neoplasm: Secondary | ICD-10-CM

## 2021-09-15 DIAGNOSIS — N10 Acute tubulo-interstitial nephritis: Secondary | ICD-10-CM | POA: Diagnosis not present

## 2021-09-15 DIAGNOSIS — G9341 Metabolic encephalopathy: Secondary | ICD-10-CM | POA: Diagnosis not present

## 2021-09-15 DIAGNOSIS — N183 Chronic kidney disease, stage 3 unspecified: Secondary | ICD-10-CM | POA: Diagnosis not present

## 2021-09-15 DIAGNOSIS — Z466 Encounter for fitting and adjustment of urinary device: Secondary | ICD-10-CM | POA: Diagnosis not present

## 2021-09-15 MED ORDER — PEN NEEDLES 31G X 8 MM MISC: 1.0000 | Freq: Every day | 0 refills | Status: AC

## 2021-09-15 NOTE — Telephone Encounter (Signed)
Returned call to Ronny Bacon at [facility] concerning pt and a hospital bed. Dr. Ronnald Ramp got on the phone with Ronny Bacon and went over fall risk since rails cannot be used on the hospital bed. At this time, prefer to keep pt in a chair in upright position. Discussed this with pt's grandson in room with her. Return call is [phone]

## 2021-09-15 NOTE — Telephone Encounter (Signed)
Copied from [place] [phone]. Topic: General - Other >> Sep 15, 2021  9:49 AM Leward Quan A wrote: Reason for CRM: Ronny Bacon with [facility] health called to inform Dr Ronnald Ramp that patient need to have orders for a Hospital bed can be sent to any place tat services area of patients residence, also patient need an Rx sent to the pharmacy for needles for insulin pen. Any further questions please call Ronny Bacon at  Ph# [facility] #[id number] [facility], [place] [address] AT [place]  Phone:  [phone] Fax:  [phone]

## 2021-09-15 NOTE — Progress Notes (Signed)
Date:  09/15/2021   Name:  Brenda [name]   DOB:  06/12/1931   MRN:  [id number]   Chief Complaint: Hospitalization Follow-up  HPI  Lab Results  Component Value Date   NA 136 06/23/2021   K 4.1 06/23/2021   CO2 25 06/23/2021   GLUCOSE 313 (H) 06/23/2021   BUN 26 (H) 06/23/2021   CREATININE 1.41 (H) 06/23/2021   CALCIUM 10.6 (H) 06/23/2021   GFRNONAA 36 (L) 06/23/2021   Lab Results  Component Value Date   CHOL 149 08/18/2015   HDL 56.90 08/18/2015   LDLCALC 72 08/18/2015   TRIG 99.0 08/18/2015   CHOLHDL 3 08/18/2015   Lab Results  Component Value Date   TSH 1.63 07/09/2013   Lab Results  Component Value Date   HGBA1C 8.5 (H) 05/11/2021   Lab Results  Component Value Date   WBC 9.3 06/23/2021   HGB 14.2 06/23/2021   HCT 40.5 06/23/2021   MCV 101.5 (H) 06/23/2021   PLT 278 06/23/2021   Lab Results  Component Value Date   ALT 18 06/23/2021   AST 29 06/23/2021   ALKPHOS 74 06/23/2021   BILITOT 0.8 06/23/2021   Lab Results  Component Value Date   VD25OH 15.80 (L) 02/16/2015     Review of Systems  Patient Active Problem List   Diagnosis Date Noted   Chronic obstructive pulmonary disease ([place]) 05/29/2019   Hypertension associated with type 2 diabetes mellitus ([place]) 03/09/2019   Diabetic peripheral neuropathy associated with type 2 diabetes mellitus ([place]) 07/25/2018   Persistent microalbuminuria associated with type 2 diabetes mellitus ([place]) 07/22/2018   Screening for breast cancer 08/18/2015   B12 deficiency 12/02/2014   Lumbago 07/09/2014   Shortness of breath 06/30/2014   Obese 06/30/2014   Microalbuminuria 06/10/2014   Right facial pain 05/18/2014   Anemia 01/01/2014   Osteoarthritis 07/31/2013   Other malaise and fatigue 07/09/2013   Muscle cramp, nocturnal 03/25/2013   Increased frequency of urination 02/09/2013   Urge incontinence 02/09/2013   Medicare annual wellness visit, subsequent 12/02/2012   Eczema 12/02/2012   Other and unspecified  hyperlipidemia 12/02/2012   Essential hypertension, benign 12/02/2012   Chronic low back pain 08/28/2012   Osteoporosis 07/23/2012   Nocturia 06/26/2012   Paresthesia of bilateral legs 05/30/2012   Pernicious anemia 05/30/2012   Type 2 diabetes mellitus with hyperglycemia, without long-term current use of insulin ([place]) 05/30/2012   Depression 05/30/2012   Insomnia 05/30/2012   Weakness of both legs 05/30/2012    Allergies  Allergen Reactions   Aspirin Other (See Comments)    bleeding   Sulfa Antibiotics Swelling   Oxybutynin Other (See Comments)    Dry mouth    Past Surgical History:  Procedure Laterality Date   ABDOMINAL HYSTERECTOMY     FOOT SURGERY  2000   SHOULDER SURGERY  2013   fracture right shoulder    Social History   Tobacco Use   Smoking status: Never   Smokeless tobacco: Never   Tobacco comments:    smoking cessation materials not required  Vaping Use   Vaping Use: Never used  Substance Use Topics   Alcohol use: No   Drug use: No     Medication list has been reviewed and updated.  Current Meds  Medication Sig   ACCU-CHEK AVIVA PLUS test strip USE 1 STRIP TO CHECK GLUCOSE THREE TIMES DAILY   Accu-Chek Softclix Lancets lancets 3 (three) times daily. Use as instructed. E11.42  cholecalciferol (VITAMIN D) 1000 units tablet Take 2,000 Units by mouth daily.   cyanocobalamin (,VITAMIN B-12,) 1000 MCG/ML injection Inject 1,000 mcg into the muscle every 30 (thirty) days.   ferrous sulfate 324 (65 Fe) MG TBEC Take 1 tablet (325 mg total) by mouth daily.   glipiZIDE (GLUCOTROL) 10 MG tablet Take 1 tablet by mouth 2 (two) times a day.   hydrOXYzine (ATARAX/VISTARIL) 10 MG tablet Take 1 tablet (10 mg total) by mouth at bedtime as needed. For itching   Insulin lispro (HUMALOG JUNIOR KWIKPEN) 100 UNIT/ML Inject into the skin.   lidocaine (LIDODERM) 5 % Place 1 patch onto the skin every 12 (twelve) hours. Remove & Discard patch within 12 hours or as directed by  MD   lisinopril (ZESTRIL) 5 MG tablet TAKE 1 TABLET(5 MG) BY MOUTH DAILY   metFORMIN (GLUCOPHAGE) 1000 MG tablet Take 1 tablet (1,000 mg total) by mouth 2 (two) times daily with a meal.   montelukast (SINGULAIR) 10 MG tablet Take 1 tablet (10 mg total) by mouth at bedtime.   nystatin cream (MYCOSTATIN) Apply 1 application topically 2 (two) times daily.   pioglitazone (ACTOS) 15 MG tablet Take 1 tablet by mouth daily.   Vibegron (GEMTESA) 75 MG TABS Take 75 mg by mouth daily.    PHQ 2/9 Scores 06/05/2021 03/28/2021 12/09/2020 10/25/2020  PHQ - 2 Score 1 0 0 0  PHQ- 9 Score - 0 0 0    GAD 7 : Generalized Anxiety Score 03/28/2021 12/09/2020 10/25/2020 07/26/2020  Nervous, Anxious, on Edge 0 0 0 1  Control/stop worrying 0 0 0 1  Worry too much - different things 0 0 0 0  Trouble relaxing 0 0 0 1  Restless 0 0 0 0  Easily annoyed or irritable 0 0 0 0  Afraid - awful might happen 0 0 0 0  Total GAD 7 Score 0 0 0 3  Anxiety Difficulty - - - Somewhat difficult    BP Readings from Last 3 Encounters:  09/15/21 (!) 130/100  06/23/21 (!) 157/87  06/12/21 (!) 157/84    Physical Exam  Wt Readings from Last 3 Encounters:  09/15/21 145 lb (65.8 kg)  06/23/21 174 lb (78.9 kg)  06/12/21 173 lb (78.5 kg)    BP (!) 130/100    Pulse 68    Temp 98.4 F (36.9 C) (Oral)    Wt 145 lb (65.8 kg)    BMI 24.13 kg/m   Assessment and Plan:  1. Hospital discharge follow-up Patient presents from nursing home the patient is brought home by family with the intentions that she is allowed to die where her family is.  Quite frankly the patient is cachectic is was positive but is not her baseline self.  She is diabetic and she can has not gotten insulin in quite frankly I do not think it would be a good idea that she received insulin.  At this point in time we will do CBC and renal panel to see if there is any AKI or infection.  2. Dehydration New onset.  Gradually worsening with patient have decreased intake.   Mucous membranes have decreased moisture and there is tenting of the skin.  We will encourage fluids as patient will tolerate. - CBC with Differential/Platelet - Renal function panel  3. Type 2 diabetes mellitus with complication, without long-term current use of insulin ([name]) Patient is with diabetes we will provide the needles because she has not received insulin because I did not  have needles but I have asked not to give this to her at this time to we have a better understanding of her level of function. - Insulin Pen Needle (PEN NEEDLES) 31G X 8 MM MISC; 1 each by Does not apply route daily.  Dispense: 30 each; Refill: 0  I spoke with home health and they are in agreement that hospital bed is not going to prevent patient from trying to get up on her own as she has from the chair and the chair does offer elevation and raising and lowering of the head.  At this point in time comfort is the most important day and may be later that if we needed for control of secretions consider a hospital bed situation.  I have discussed with the patient's family the consideration of involving hospice and I am not sure if there is in the consideration at this time but have discussed this with home health and they are in agreement to allow family time to decide.  When asked if there is been much of a change in her status from the time that she left the hospital to which she is now there is no indication that there is been a significant change in quite frankly she looks cachectic and probably approaching end-stage life concerns.  We have encouraged fluids as the patient can tolerate.  And have advised home health that they may want to change the catheter given the possibility of infection at this time.

## 2021-09-16 LAB — CBC WITH DIFFERENTIAL/PLATELET
Basophils Absolute: 0.1 10*3/uL (ref 0.0–0.2)
Basos: 1 %
EOS (ABSOLUTE): 0 10*3/uL (ref 0.0–0.4)
Eos: 0 %
Hematocrit: 40.3 % (ref 34.0–46.6)
Hemoglobin: 13.7 g/dL (ref 11.1–15.9)
Immature Grans (Abs): 0.1 10*3/uL (ref 0.0–0.1)
Immature Granulocytes: 1 %
Lymphocytes Absolute: 1.6 10*3/uL (ref 0.7–3.1)
Lymphs: 14 %
MCH: 33.2 pg — ABNORMAL HIGH (ref 26.6–33.0)
MCHC: 34 g/dL (ref 31.5–35.7)
MCV: 98 fL — ABNORMAL HIGH (ref 79–97)
Monocytes Absolute: 0.4 10*3/uL (ref 0.1–0.9)
Monocytes: 4 %
Neutrophils Absolute: 8.7 10*3/uL — ABNORMAL HIGH (ref 1.4–7.0)
Neutrophils: 80 %
Platelets: 379 10*3/uL (ref 150–450)
RBC: 4.13 x10E6/uL (ref 3.77–5.28)
RDW: 12.9 % (ref 11.7–15.4)
WBC: 10.9 10*3/uL — ABNORMAL HIGH (ref 3.4–10.8)

## 2021-09-16 LAB — RENAL FUNCTION PANEL
Albumin: 4.4 g/dL (ref 3.6–4.6)
BUN/Creatinine Ratio: 20 (ref 12–28)
BUN: 39 mg/dL — ABNORMAL HIGH (ref 8–27)
CO2: 19 mmol/L — ABNORMAL LOW (ref 20–29)
Calcium: 10.7 mg/dL — ABNORMAL HIGH (ref 8.7–10.3)
Chloride: 92 mmol/L — ABNORMAL LOW (ref 96–106)
Creatinine, Ser: 1.98 mg/dL — ABNORMAL HIGH (ref 0.57–1.00)
Glucose: 313 mg/dL — ABNORMAL HIGH (ref 70–99)
Phosphorus: 4.2 mg/dL (ref 3.0–4.3)
Potassium: 6.2 mmol/L — ABNORMAL HIGH (ref 3.5–5.2)
Sodium: 132 mmol/L — ABNORMAL LOW (ref 134–144)
eGFR: 24 mL/min/{1.73_m2} — ABNORMAL LOW (ref >59)

## 2021-09-18 ENCOUNTER — Telehealth: Payer: Self-pay | Admitting: Family Medicine

## 2021-09-18 DIAGNOSIS — G311 Senile degeneration of brain, not elsewhere classified: Secondary | ICD-10-CM | POA: Diagnosis not present

## 2021-09-18 DIAGNOSIS — F02A0 Dementia in other diseases classified elsewhere, mild, without behavioral disturbance, psychotic disturbance, mood disturbance, and anxiety: Secondary | ICD-10-CM | POA: Diagnosis not present

## 2021-09-18 DIAGNOSIS — J9691 Respiratory failure, unspecified with hypoxia: Secondary | ICD-10-CM | POA: Diagnosis not present

## 2021-09-18 DIAGNOSIS — Z515 Encounter for palliative care: Secondary | ICD-10-CM | POA: Diagnosis not present

## 2021-09-18 DIAGNOSIS — F339 Major depressive disorder, recurrent, unspecified: Secondary | ICD-10-CM | POA: Diagnosis not present

## 2021-09-18 DIAGNOSIS — N183 Chronic kidney disease, stage 3 unspecified: Secondary | ICD-10-CM | POA: Diagnosis not present

## 2021-09-18 DIAGNOSIS — E119 Type 2 diabetes mellitus without complications: Secondary | ICD-10-CM | POA: Diagnosis not present

## 2021-09-18 DIAGNOSIS — G9341 Metabolic encephalopathy: Secondary | ICD-10-CM | POA: Diagnosis not present

## 2021-09-18 DIAGNOSIS — K219 Gastro-esophageal reflux disease without esophagitis: Secondary | ICD-10-CM | POA: Diagnosis not present

## 2021-09-18 NOTE — Telephone Encounter (Signed)
Brenda [name] w/ [facility] calling to get an order for hospice evaluation faxed to their office Fax [phone]    Cb  [phone]

## 2021-09-19 DIAGNOSIS — E119 Type 2 diabetes mellitus without complications: Secondary | ICD-10-CM | POA: Diagnosis not present

## 2021-09-19 DIAGNOSIS — F339 Major depressive disorder, recurrent, unspecified: Secondary | ICD-10-CM | POA: Diagnosis not present

## 2021-09-19 DIAGNOSIS — J9691 Respiratory failure, unspecified with hypoxia: Secondary | ICD-10-CM | POA: Diagnosis not present

## 2021-09-19 DIAGNOSIS — G311 Senile degeneration of brain, not elsewhere classified: Secondary | ICD-10-CM | POA: Diagnosis not present

## 2021-09-19 DIAGNOSIS — F02A0 Dementia in other diseases classified elsewhere, mild, without behavioral disturbance, psychotic disturbance, mood disturbance, and anxiety: Secondary | ICD-10-CM | POA: Diagnosis not present

## 2021-09-19 DIAGNOSIS — G9341 Metabolic encephalopathy: Secondary | ICD-10-CM | POA: Diagnosis not present

## 2021-09-20 DIAGNOSIS — J9691 Respiratory failure, unspecified with hypoxia: Secondary | ICD-10-CM | POA: Diagnosis not present

## 2021-09-20 DIAGNOSIS — G9341 Metabolic encephalopathy: Secondary | ICD-10-CM | POA: Diagnosis not present

## 2021-09-20 DIAGNOSIS — F339 Major depressive disorder, recurrent, unspecified: Secondary | ICD-10-CM | POA: Diagnosis not present

## 2021-09-20 DIAGNOSIS — F02A0 Dementia in other diseases classified elsewhere, mild, without behavioral disturbance, psychotic disturbance, mood disturbance, and anxiety: Secondary | ICD-10-CM | POA: Diagnosis not present

## 2021-09-20 DIAGNOSIS — E119 Type 2 diabetes mellitus without complications: Secondary | ICD-10-CM | POA: Diagnosis not present

## 2021-09-20 DIAGNOSIS — G311 Senile degeneration of brain, not elsewhere classified: Secondary | ICD-10-CM | POA: Diagnosis not present

## 2021-09-21 DIAGNOSIS — J9691 Respiratory failure, unspecified with hypoxia: Secondary | ICD-10-CM | POA: Diagnosis not present

## 2021-09-21 DIAGNOSIS — F02A0 Dementia in other diseases classified elsewhere, mild, without behavioral disturbance, psychotic disturbance, mood disturbance, and anxiety: Secondary | ICD-10-CM | POA: Diagnosis not present

## 2021-09-21 DIAGNOSIS — F339 Major depressive disorder, recurrent, unspecified: Secondary | ICD-10-CM | POA: Diagnosis not present

## 2021-09-21 DIAGNOSIS — G9341 Metabolic encephalopathy: Secondary | ICD-10-CM | POA: Diagnosis not present

## 2021-09-21 DIAGNOSIS — G311 Senile degeneration of brain, not elsewhere classified: Secondary | ICD-10-CM | POA: Diagnosis not present

## 2021-09-21 DIAGNOSIS — E119 Type 2 diabetes mellitus without complications: Secondary | ICD-10-CM | POA: Diagnosis not present

## 2021-09-25 DIAGNOSIS — F339 Major depressive disorder, recurrent, unspecified: Secondary | ICD-10-CM | POA: Diagnosis not present

## 2021-09-25 DIAGNOSIS — F02A0 Dementia in other diseases classified elsewhere, mild, without behavioral disturbance, psychotic disturbance, mood disturbance, and anxiety: Secondary | ICD-10-CM | POA: Diagnosis not present

## 2021-09-25 DIAGNOSIS — E119 Type 2 diabetes mellitus without complications: Secondary | ICD-10-CM | POA: Diagnosis not present

## 2021-09-25 DIAGNOSIS — J9691 Respiratory failure, unspecified with hypoxia: Secondary | ICD-10-CM | POA: Diagnosis not present

## 2021-09-25 DIAGNOSIS — G9341 Metabolic encephalopathy: Secondary | ICD-10-CM | POA: Diagnosis not present

## 2021-09-25 DIAGNOSIS — G311 Senile degeneration of brain, not elsewhere classified: Secondary | ICD-10-CM | POA: Diagnosis not present

## 2021-09-28 DIAGNOSIS — F339 Major depressive disorder, recurrent, unspecified: Secondary | ICD-10-CM | POA: Diagnosis not present

## 2021-09-28 DIAGNOSIS — J9691 Respiratory failure, unspecified with hypoxia: Secondary | ICD-10-CM | POA: Diagnosis not present

## 2021-09-28 DIAGNOSIS — G9341 Metabolic encephalopathy: Secondary | ICD-10-CM | POA: Diagnosis not present

## 2021-09-28 DIAGNOSIS — E119 Type 2 diabetes mellitus without complications: Secondary | ICD-10-CM | POA: Diagnosis not present

## 2021-09-28 DIAGNOSIS — G311 Senile degeneration of brain, not elsewhere classified: Secondary | ICD-10-CM | POA: Diagnosis not present

## 2021-09-28 DIAGNOSIS — F02A0 Dementia in other diseases classified elsewhere, mild, without behavioral disturbance, psychotic disturbance, mood disturbance, and anxiety: Secondary | ICD-10-CM | POA: Diagnosis not present

## 2021-09-29 DIAGNOSIS — G9341 Metabolic encephalopathy: Secondary | ICD-10-CM | POA: Diagnosis not present

## 2021-09-29 DIAGNOSIS — F339 Major depressive disorder, recurrent, unspecified: Secondary | ICD-10-CM | POA: Diagnosis not present

## 2021-09-29 DIAGNOSIS — E119 Type 2 diabetes mellitus without complications: Secondary | ICD-10-CM | POA: Diagnosis not present

## 2021-09-29 DIAGNOSIS — F02A0 Dementia in other diseases classified elsewhere, mild, without behavioral disturbance, psychotic disturbance, mood disturbance, and anxiety: Secondary | ICD-10-CM | POA: Diagnosis not present

## 2021-09-29 DIAGNOSIS — G311 Senile degeneration of brain, not elsewhere classified: Secondary | ICD-10-CM | POA: Diagnosis not present

## 2021-09-29 DIAGNOSIS — J9691 Respiratory failure, unspecified with hypoxia: Secondary | ICD-10-CM | POA: Diagnosis not present

## 2021-10-11 DEATH — deceased

## 2022-06-06 ENCOUNTER — Ambulatory Visit (INDEPENDENT_AMBULATORY_CARE_PROVIDER_SITE_OTHER): Payer: Medicare Other

## 2022-06-06 NOTE — Progress Notes (Deleted)
Subjective:   Brenda [name] is a 88 y.o. female who presents for Medicare Annual (Subsequent) preventive examination.  I connected with  [name] on 06/06/22 by a audio enabled telemedicine application and verified that I am speaking with the correct person using two identifiers.  Patient Location: Home  Provider Location: Office/Clinic  I discussed the limitations of evaluation and management by telemedicine. The patient expressed understanding and agreed to proceed.   Review of Systems    Defer to PCP       Objective:    Today's Vitals   06/06/22 1346  Weight: 145 lb (65.8 kg)  Height: '5\' 5"'$  (1.651 m)  PainSc: 0-No pain   Body mass index is 24.13 kg/m.     06/23/2021    2:32 PM 06/05/2021    3:12 PM 02/21/2021   12:29 PM 03/09/2020    8:54 AM 01/31/2020   10:04 AM 03/11/2019   11:46 AM 03/09/2019    8:32 AM  Advanced Directives  Does Patient Have a Medical Advance Directive? No No No Yes No No Yes  Type of Scientist, research (medical);Living will   [place];Living will  Copy of [place] in Chart?    No - copy requested   No - copy requested  Would patient like information on creating a medical advance directive?  No - Patient declined         Current Medications (verified) Outpatient Encounter Medications as of 06/06/2022  Medication Sig   ACCU-CHEK AVIVA PLUS test strip USE 1 STRIP TO CHECK GLUCOSE THREE TIMES DAILY   Accu-Chek Softclix Lancets lancets 3 (three) times daily. Use as instructed. E11.42   cholecalciferol (VITAMIN D) 1000 units tablet Take 2,000 Units by mouth daily.   cyanocobalamin (,VITAMIN B-12,) 1000 MCG/ML injection Inject 1,000 mcg into the muscle every 30 (thirty) days.   ferrous sulfate 324 (65 Fe) MG TBEC Take 1 tablet (325 mg total) by mouth daily.   glipiZIDE (GLUCOTROL) 10 MG tablet Take 1 tablet by mouth 2 (two) times a day.   hydrOXYzine (ATARAX/VISTARIL) 10 MG tablet Take 1  tablet (10 mg total) by mouth at bedtime as needed. For itching   Insulin lispro (HUMALOG JUNIOR KWIKPEN) 100 UNIT/ML Inject into the skin.   Insulin Pen Needle (PEN NEEDLES) 31G X 8 MM MISC 1 each by Does not apply route daily.   lidocaine (LIDODERM) 5 % Place 1 patch onto the skin every 12 (twelve) hours. Remove & Discard patch within 12 hours or as directed by MD   lisinopril (ZESTRIL) 5 MG tablet TAKE 1 TABLET(5 MG) BY MOUTH DAILY   metFORMIN (GLUCOPHAGE) 1000 MG tablet Take 1 tablet (1,000 mg total) by mouth 2 (two) times daily with a meal.   montelukast (SINGULAIR) 10 MG tablet Take 1 tablet (10 mg total) by mouth at bedtime.   nystatin cream (MYCOSTATIN) Apply 1 application topically 2 (two) times daily.   pioglitazone (ACTOS) 15 MG tablet Take 1 tablet by mouth daily.   Vibegron (GEMTESA) 75 MG TABS Take 75 mg by mouth daily.   No facility-administered encounter medications on file as of 06/06/2022.    Allergies (verified) Aspirin, Sulfa antibiotics, and Oxybutynin   History: Past Medical History:  Diagnosis Date   Arthritis    Blood in stool    Cancer ([place])    Chicken pox    Diabetes mellitus 2000   Dry skin  GERD (gastroesophageal reflux disease)    Hiatal hernia    Hypertension    Kidney infection 2014   Osteoporosis    Ovarian cancer ([place]) 1991   s/p hysterectomy at [facility]   Pernicious anemia    Spinal stenosis    Ulcer    Urinary incontinence    Past Surgical History:  Procedure Laterality Date   ABDOMINAL HYSTERECTOMY     FOOT SURGERY  2000   SHOULDER SURGERY  2013   fracture right shoulder   Family History  Problem Relation Age of Onset   Arthritis Mother    Cancer Mother        colon   Arthritis Father    Cancer Father        colon   Cancer Sister        lung   Cancer Other        lung   Social History   Socioeconomic History   Marital status: Widowed    Spouse name: Not on file   Number of children: 2   Years of education: Not on file    Highest education level: 9th grade  Occupational History   Occupation: Retired  Tobacco Use   Smoking status: Never   Smokeless tobacco: Never   Tobacco comments:    smoking cessation materials not required  Vaping Use   Vaping Use: Never used  Substance and Sexual Activity   Alcohol use: No   Drug use: No   Sexual activity: Not Currently    Birth control/protection: Post-menopausal  Other Topics Concern   Not on file  Social History Narrative   Lives alone in [place]. 1 daughter living in [place]. Son died lung cancer.   Social Determinants of Health   Financial Resource Strain: Low Risk  (06/05/2021)   Overall Financial Resource Strain (CARDIA)    Difficulty of Paying Living Expenses: Not hard at all  Food Insecurity: No Food Insecurity (06/05/2021)   Hunger Vital Sign    Worried About Running Out of Food in the Last Year: Never true    Ran Out of Food in the Last Year: Never true  Transportation Needs: No Transportation Needs (06/05/2021)   PRAPARE - Hydrologist (Medical): No    Lack of Transportation (Non-Medical): No  Physical Activity: Inactive (06/05/2021)   Exercise Vital Sign    Days of Exercise per Week: 0 days    Minutes of Exercise per Session: 0 min  Stress: No Stress Concern Present (06/05/2021)   [place]    Feeling of Stress : Only a little  Social Connections: Moderately Isolated (06/05/2021)   Social Connection and Isolation Panel [NHANES]    Frequency of Communication with Friends and Family: More than three times a week    Frequency of Social Gatherings with Friends and Family: Three times a week    Attends Religious Services: More than 4 times per year    Active Member of Clubs or Organizations: No    Attends Archivist Meetings: Never    Marital Status: Widowed    Tobacco Counseling Counseling given: Not Answered Tobacco comments: smoking  cessation materials not required   Clinical Intake:     Pain Score: 0-No pain           Diabetic? YES         Activities of Daily Living     No data to display  Patient Care Team: Juline Patch, MD as PCP - General (Family Medicine) Leandrew Koyanagi, MD as Consulting Physician (Ophthalmology) Gabriel Carina Betsey Holiday, MD as Physician Assistant (Endocrinology) Nori Riis, PA-C as Physician Assistant (Urology)  Indicate any recent Medical Services you may have received from other than [facility] providers in the past year (date may be approximate).     Assessment:   This is a routine wellness examination for Kemiya.  Hearing/Vision screen No results found.  Dietary issues and exercise activities discussed:     Goals Addressed   None   Depression Screen    06/05/2021    3:06 PM 03/28/2021    1:27 PM 12/09/2020    3:13 PM 10/25/2020    4:57 PM 07/26/2020    1:38 PM 03/09/2020    8:52 AM 02/18/2020   11:32 AM  PHQ 2/9 Scores  PHQ - 2 Score 1 0 0 0 0 1 0  PHQ- 9 Score  0 0 0 5 5 0    Fall Risk    06/05/2021    3:13 PM 03/28/2021    1:27 PM 10/25/2020    4:57 PM 07/26/2020    1:38 PM 03/09/2020    8:57 AM  [place] in the past year? 0 0 0 0 0  Number falls in past yr: 0 0   0  Injury with Fall? 0 0   0  Risk for fall due to : Impaired balance/gait;Impaired mobility No Fall Risks   Impaired balance/gait;Impaired mobility;Impaired vision;Orthopedic patient  Follow up Falls prevention discussed Falls evaluation completed Falls evaluation completed Falls evaluation completed Falls prevention discussed    FALL RISK PREVENTION PERTAINING TO THE HOME:  Any stairs in or around the home? {YES/NO:21197} If so, are there any without handrails? {YES/NO:21197} Home free of loose throw rugs in walkways, pet beds, electrical cords, etc? {YES/NO:21197} Adequate lighting in your home to reduce risk of falls? {YES/NO:21197}  ASSISTIVE DEVICES UTILIZED  TO PREVENT FALLS:  Life alert? {YES/NO:21197} Use of a cane, walker or w/c? {YES/NO:21197} Grab bars in the bathroom? {YES/NO:21197} Shower chair or bench in shower? {YES/NO:21197} Elevated toilet seat or a handicapped toilet? {YES/NO:21197}   Cognitive Function:        03/09/2020    8:58 AM 03/09/2019    8:39 AM 02/19/2018    2:05 PM 02/18/2017   11:09 AM  6CIT Screen  What Year? 0 points 0 points 0 points 0 points  What month? 0 points 0 points 0 points 0 points  What time? 0 points 0 points 0 points 0 points  Count back from 20 0 points 0 points 0 points 0 points  Months in reverse 0 points 2 points 0 points 0 points  Repeat phrase 2 points 4 points 0 points 0 points  Total Score 2 points 6 points 0 points 0 points    Immunizations Immunization History  Administered Date(s) Administered   Fluad Quad(high Dose 65+) 06/30/2019, 07/28/2021   Influenza Split 06/18/2012, 06/18/2015, 06/15/2016   Influenza, High Dose Seasonal PF 06/24/2017   Influenza,inj,Quad PF,6+ Mos 06/25/2013, 07/09/2014, 07/28/2021   Influenza-Unspecified 09/10/2018, 06/10/2020   PFIZER Comirnaty(Gray Top)Covid-19 Tri-Sucrose Vaccine 10/17/2019, 11/07/2019   PFIZER(Purple Top)SARS-COV-2 Vaccination 10/17/2019, 05/11/2020   Pneumococcal Conjugate-13 01/01/2014   Pneumococcal-Unspecified 08/29/2011    TDAP status: Due, Education has been provided regarding the importance of this vaccine. Advised may receive this vaccine at local pharmacy or Health Dept. Aware to provide a copy of the vaccination record if  obtained from local pharmacy or Health Dept. Verbalized acceptance and understanding.  Flu Vaccine status: Due, Education has been provided regarding the importance of this vaccine. Advised may receive this vaccine at local pharmacy or Health Dept. Aware to provide a copy of the vaccination record if obtained from local pharmacy or Health Dept. Verbalized acceptance and understanding.  Pneumococcal vaccine  status: Up to date  Covid-19 vaccine status: Completed vaccines  Qualifies for Shingles Vaccine? Yes   Zostavax completed No   Shingrix Completed?: No.    Education has been provided regarding the importance of this vaccine. Patient has been advised to call insurance company to determine out of pocket expense if they have not yet received this vaccine. Advised may also receive vaccine at local pharmacy or Health Dept. Verbalized acceptance and understanding.  Screening Tests Health Maintenance  Topic Date Due   Zoster Vaccines- Shingrix (1 of 2) Never done   Pneumonia Vaccine 47+ Years old (2 - PPSV23 or PCV20) 02/26/2014   TETANUS/TDAP  07/23/2017   COVID-19 Vaccine (5 - Pfizer risk series) 07/06/2020   FOOT EXAM  06/10/2021   HEMOGLOBIN A1C  11/08/2021   INFLUENZA VACCINE  12/09/2022 (Originally 04/10/2022)   OPHTHALMOLOGY EXAM  06/15/2022   DEXA SCAN  Completed   HPV VACCINES  Aged Out    Health Maintenance  Health Maintenance Due  Topic Date Due   Zoster Vaccines- Shingrix (1 of 2) Never done   Pneumonia Vaccine 10+ Years old (2 - PPSV23 or PCV20) 02/26/2014   TETANUS/TDAP  07/23/2017   COVID-19 Vaccine (5 - Pfizer risk series) 07/06/2020   FOOT EXAM  06/10/2021   HEMOGLOBIN A1C  11/08/2021    Colorectal cancer screening: No longer required.   Mammogram status: No longer required due to age.  Bone Density status: Completed 06/24/2012. Results reflect: Bone density results: OSTEOPOROSIS. Repeat every 2 years.  Lung Cancer Screening: (Low Dose CT Chest recommended if Age 66-80 years, 30 pack-year currently smoking OR have quit w/in 15years.) does not qualify.   Lung Cancer Screening Referral: N/A  Additional Screening:  Hepatitis C Screening: does not qualify;  Vision Screening: Recommended annual ophthalmology exams for early detection of glaucoma and other disorders of the eye. Is the patient up to date with their annual eye exam?  {YES/NO:21197} Who is the  provider or what is the name of the office in which the patient attends annual eye exams? *** If pt is not established with a provider, would they like to be referred to a provider to establish care? {YES/NO:21197}.   Dental Screening: Recommended annual dental exams for proper oral hygiene  Community Resource Referral / Chronic Care Management: CRR required this visit?  {YES/NO:21197}  CCM required this visit?  {YES/NO:21197}     Plan:     I have personally reviewed and noted the following in the patient's chart:   Medical and social history Use of alcohol, tobacco or illicit drugs  Current medications and supplements including opioid prescriptions. Patient is not currently taking opioid prescriptions. Functional ability and status Nutritional status Physical activity Advanced directives List of other physicians Hospitalizations, surgeries, and ER visits in previous 12 months Vitals Screenings to include cognitive, depression, and falls Referrals and appointments  In addition, I have reviewed and discussed with patient certain preventive protocols, quality metrics, and best practice recommendations. A written personalized care plan for preventive services as well as general preventive health recommendations were provided to patient.     Clista Bernhardt, CMA  06/06/2022     Ms. Felton Clinton , Thank you for taking time to come for your Medicare Wellness Visit. I appreciate your ongoing commitment to your health goals. Please review the following plan we discussed and let me know if I can assist you in the future.   These are the goals we discussed:  Goals      DIET - INCREASE WATER INTAKE     Recommend to drink at least 6-8 8oz glasses of water per day.     Exercise 150 minutes per week (moderate activity)     Recommend increasing exercise to help promote better sleep        This is a list of the screening recommended for you and due dates:  Health Maintenance  Topic  Date Due   Zoster (Shingles) Vaccine (1 of 2) Never done   Pneumonia Vaccine (2 - PPSV23 or PCV20) 02/26/2014   Tetanus Vaccine  07/23/2017   COVID-19 Vaccine (5 - Pfizer risk series) 07/06/2020   Complete foot exam   06/10/2021   Hemoglobin A1C  11/08/2021   Flu Shot  12/09/2022*   Eye exam for diabetics  06/15/2022   DEXA scan (bone density measurement)  Completed   HPV Vaccine  Aged Out  *Topic was postponed. The date shown is not the original due date.     Nurse Notes: ***

## 2022-12-30 IMAGING — DX DG CHEST 1V PORT
1 series · 1 of 1 positions shown · non-contrast
Comparison: 05/16/2017

CLINICAL DATA: Right shoulder pain

EXAM:
PORTABLE CHEST 1 VIEW

[chest ap]
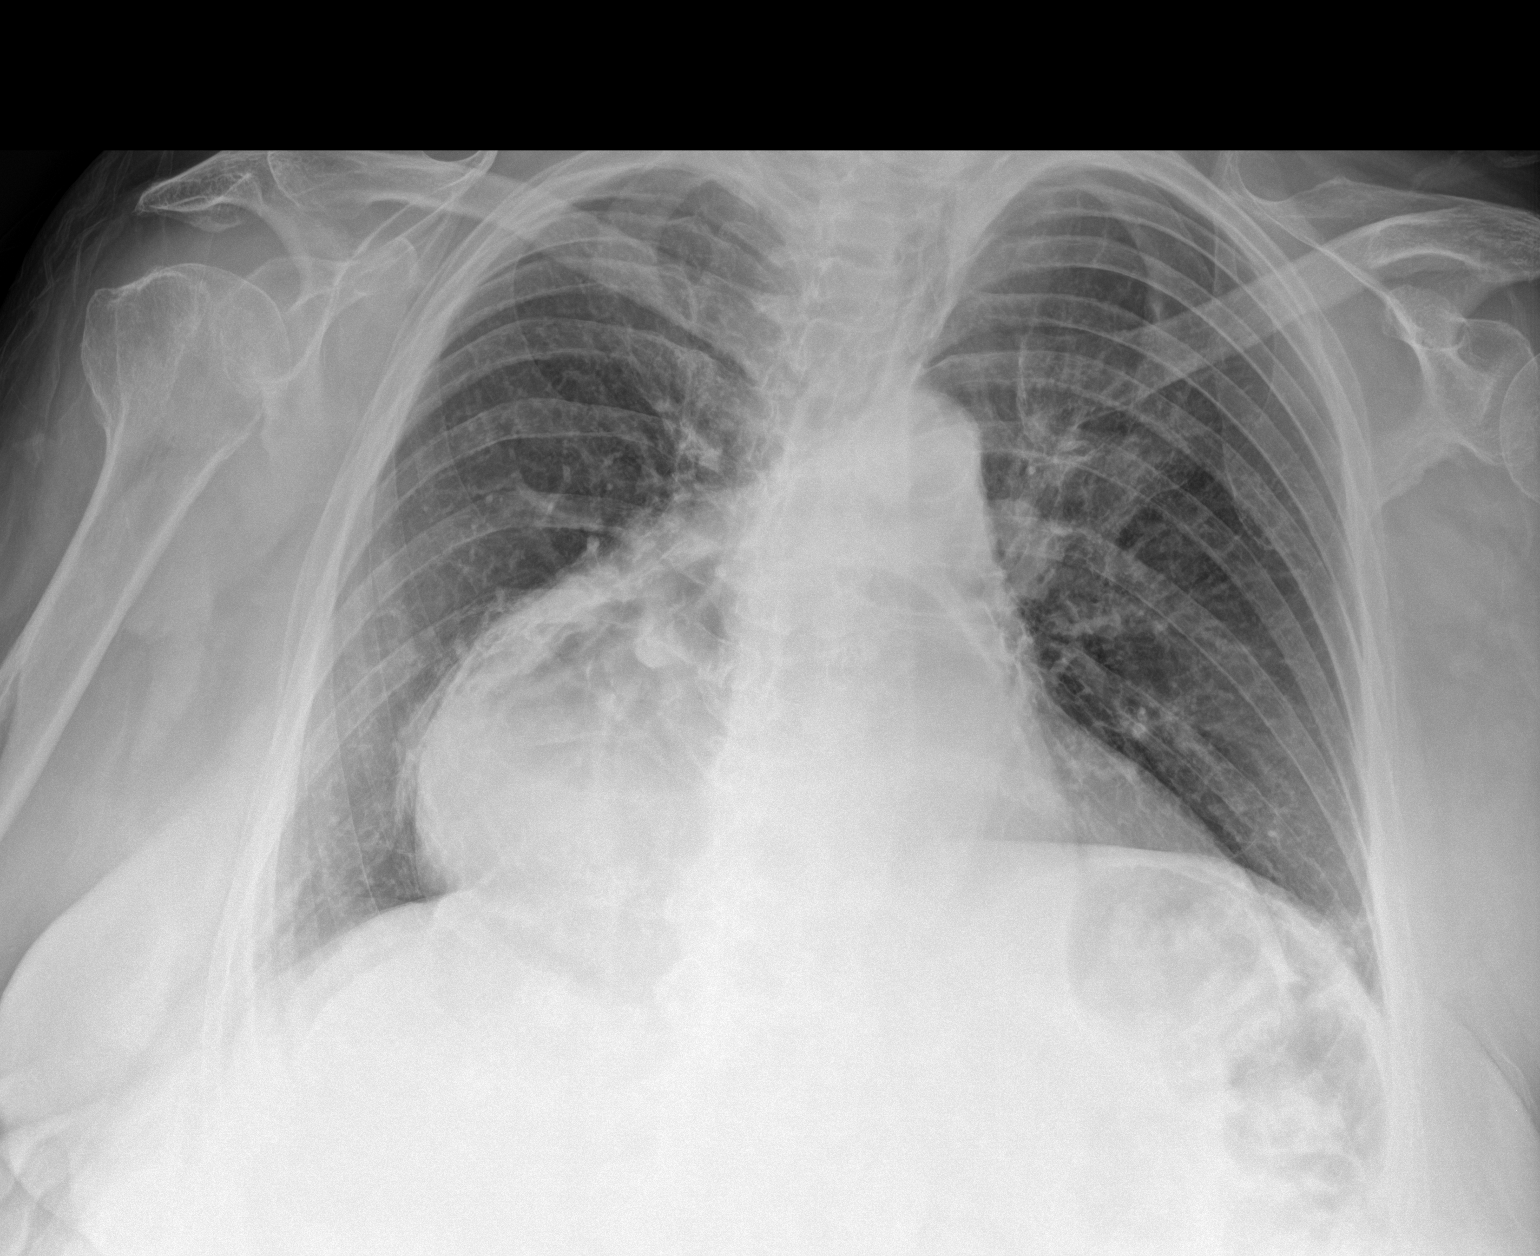

[1 of 1 positions shown; findings below may reference images not displayed]

FINDINGS: Very large hiatal hernia.

Heart size and vascularity normal. Lungs well aerated and clear
without infiltrate or effusion
IMPRESSION: Very large hiatal hernia.  Lungs are clear.

## 2022-12-30 IMAGING — CR DG FOREARM 2V*R*
1 series · 2 of 2 positions shown · non-contrast
Comparison: August 13, 2011

CLINICAL DATA: Atraumatic right forearm pain.

EXAM:
RIGHT FOREARM - 2 VIEW

[Series 1: dg forearm right · 0.14mm/px · 2 of 2 slices shown]
[im 1/2]
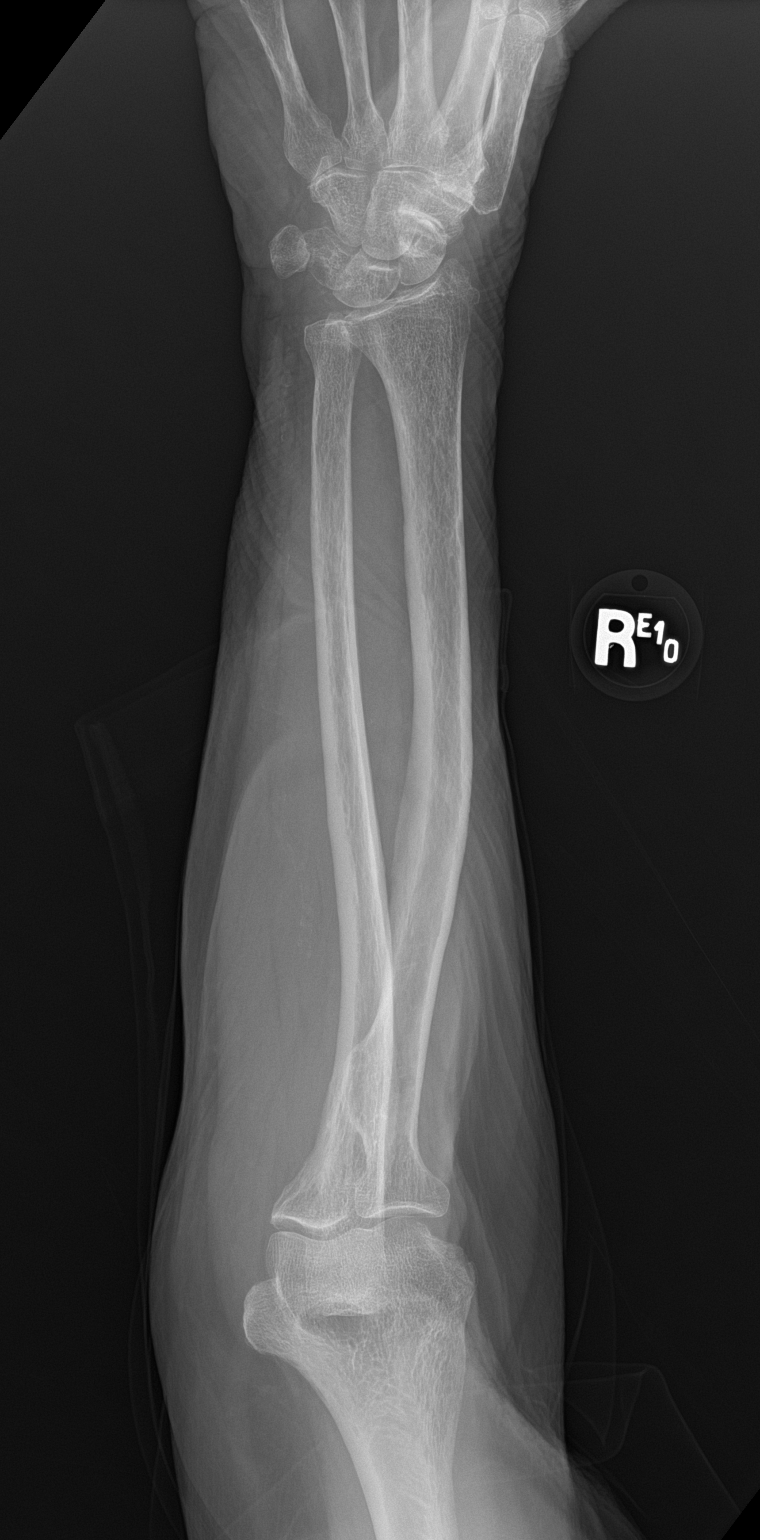
[im 2/2]
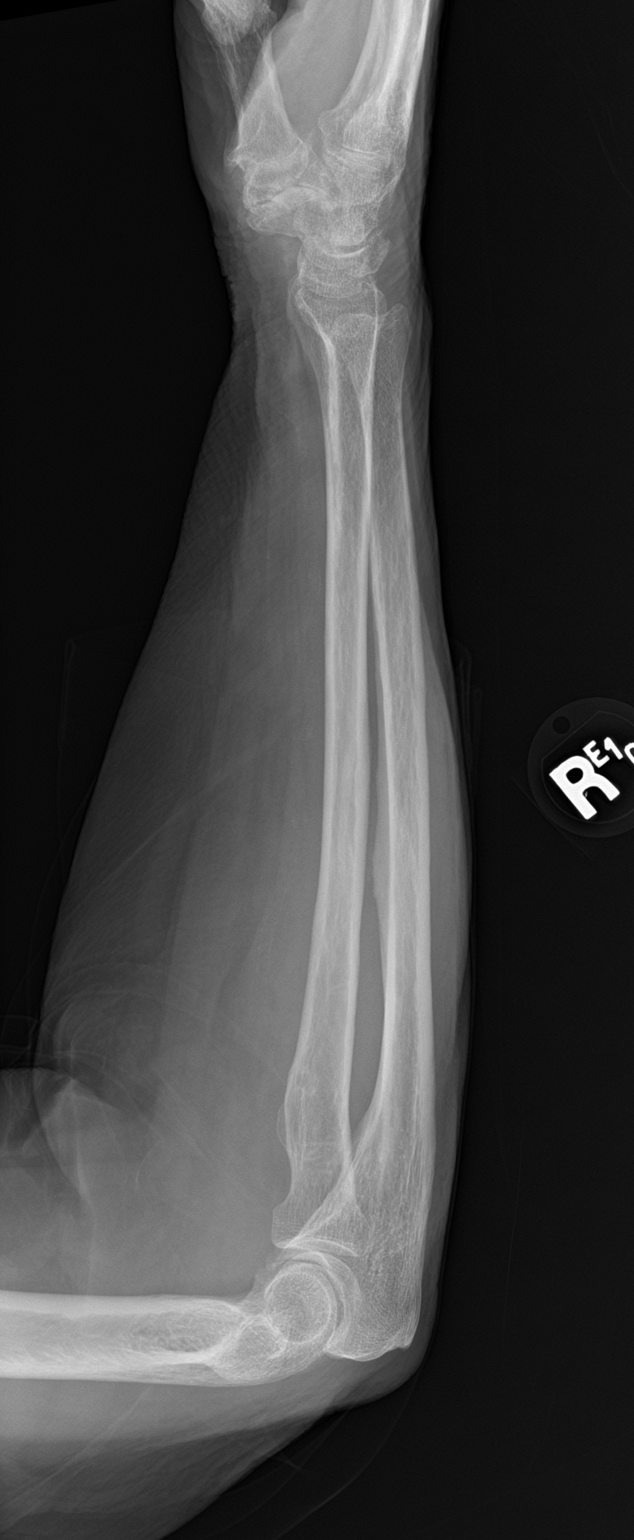

[2 of 2 positions shown; findings below may reference images not displayed]

FINDINGS: There is no evidence of fracture or other focal bone lesions. Soft
tissues are unremarkable.
IMPRESSION: Negative.
